# Patient Record
Sex: Male | Born: 1945
Health system: Southern US, Community
[De-identification: ages and names within clinical notes are randomized; demographics above are authoritative.]

## PROBLEM LIST (undated history)

## (undated) DIAGNOSIS — B20 Human immunodeficiency virus [HIV] disease: Secondary | ICD-10-CM

## (undated) DIAGNOSIS — E785 Hyperlipidemia, unspecified: Secondary | ICD-10-CM

## (undated) DIAGNOSIS — Z21 Asymptomatic human immunodeficiency virus [HIV] infection status: Secondary | ICD-10-CM

## (undated) DIAGNOSIS — E039 Hypothyroidism, unspecified: Secondary | ICD-10-CM

## (undated) DIAGNOSIS — I700 Atherosclerosis of aorta: Secondary | ICD-10-CM

## (undated) DIAGNOSIS — I10 Essential (primary) hypertension: Principal | ICD-10-CM

## (undated) DIAGNOSIS — E782 Mixed hyperlipidemia: Principal | ICD-10-CM

## (undated) DIAGNOSIS — E291 Testicular hypofunction: Secondary | ICD-10-CM

## (undated) DIAGNOSIS — Z23 Encounter for immunization: Principal | ICD-10-CM

## (undated) DIAGNOSIS — Z1159 Encounter for screening for other viral diseases: Secondary | ICD-10-CM

## (undated) DIAGNOSIS — M7712 Lateral epicondylitis, left elbow: Principal | ICD-10-CM

## (undated) DIAGNOSIS — C61 Malignant neoplasm of prostate: Principal | ICD-10-CM

## (undated) DIAGNOSIS — Z2911 Encounter for prophylactic immunotherapy for respiratory syncytial virus (RSV): Secondary | ICD-10-CM

## (undated) DIAGNOSIS — Z87891 Personal history of nicotine dependence: Secondary | ICD-10-CM

## (undated) HISTORY — DX: Hypothyroidism, unspecified: E03.9

## (undated) HISTORY — DX: Hyperlipidemia, unspecified: E78.5

## (undated) HISTORY — DX: Atherosclerosis of aorta: I70.0

## (undated) HISTORY — DX: Human immunodeficiency virus (HIV) disease: B20

## (undated) HISTORY — DX: Asymptomatic human immunodeficiency virus (hiv) infection status: Z21

---

## 1987-11-22 ENCOUNTER — Encounter (INDEPENDENT_AMBULATORY_CARE_PROVIDER_SITE_OTHER): Payer: Self-pay | Admitting: *Deleted

## 1987-11-22 LAB — CONVERTED CEMR LAB
CD4 Count: 240 microliters
CD4 T Cell Abs: 240

## 1998-12-23 ENCOUNTER — Encounter: Admission: RE | Admit: 1998-12-23 | Discharge: 1998-12-23 | Payer: Self-pay | Admitting: Infectious Diseases

## 1998-12-23 ENCOUNTER — Ambulatory Visit (HOSPITAL_COMMUNITY): Admission: RE | Admit: 1998-12-23 | Discharge: 1998-12-23 | Payer: Self-pay | Admitting: Infectious Diseases

## 1999-01-15 ENCOUNTER — Encounter: Admission: RE | Admit: 1999-01-15 | Discharge: 1999-01-15 | Payer: Self-pay | Admitting: Infectious Diseases

## 1999-01-15 ENCOUNTER — Ambulatory Visit (HOSPITAL_COMMUNITY): Admission: RE | Admit: 1999-01-15 | Discharge: 1999-01-15 | Payer: Self-pay | Admitting: Infectious Diseases

## 1999-02-12 ENCOUNTER — Encounter: Admission: RE | Admit: 1999-02-12 | Discharge: 1999-02-12 | Payer: Self-pay | Admitting: Infectious Diseases

## 1999-03-08 ENCOUNTER — Encounter: Admission: RE | Admit: 1999-03-08 | Discharge: 1999-03-08 | Payer: Self-pay | Admitting: Infectious Diseases

## 1999-04-26 ENCOUNTER — Encounter: Admission: RE | Admit: 1999-04-26 | Discharge: 1999-04-26 | Payer: Self-pay | Admitting: Infectious Diseases

## 1999-04-26 ENCOUNTER — Ambulatory Visit (HOSPITAL_COMMUNITY): Admission: RE | Admit: 1999-04-26 | Discharge: 1999-04-26 | Payer: Self-pay | Admitting: Infectious Diseases

## 1999-05-19 ENCOUNTER — Encounter: Admission: RE | Admit: 1999-05-19 | Discharge: 1999-05-19 | Payer: Self-pay | Admitting: Infectious Diseases

## 1999-08-17 ENCOUNTER — Ambulatory Visit (HOSPITAL_COMMUNITY): Admission: RE | Admit: 1999-08-17 | Discharge: 1999-08-17 | Payer: Self-pay | Admitting: Infectious Diseases

## 1999-09-13 ENCOUNTER — Encounter: Admission: RE | Admit: 1999-09-13 | Discharge: 1999-09-13 | Payer: Self-pay | Admitting: Infectious Diseases

## 1999-12-14 ENCOUNTER — Encounter: Admission: RE | Admit: 1999-12-14 | Discharge: 1999-12-14 | Payer: Self-pay | Admitting: Infectious Diseases

## 1999-12-14 ENCOUNTER — Ambulatory Visit (HOSPITAL_COMMUNITY): Admission: RE | Admit: 1999-12-14 | Discharge: 1999-12-14 | Payer: Self-pay | Admitting: Infectious Diseases

## 2000-01-12 ENCOUNTER — Encounter: Admission: RE | Admit: 2000-01-12 | Discharge: 2000-01-12 | Payer: Self-pay | Admitting: Infectious Diseases

## 2000-03-25 ENCOUNTER — Ambulatory Visit (HOSPITAL_COMMUNITY): Admission: RE | Admit: 2000-03-25 | Discharge: 2000-03-25 | Payer: Self-pay | Admitting: Orthopedic Surgery

## 2000-03-25 ENCOUNTER — Encounter: Payer: Self-pay | Admitting: Orthopedic Surgery

## 2000-04-27 ENCOUNTER — Ambulatory Visit (HOSPITAL_COMMUNITY): Admission: RE | Admit: 2000-04-27 | Discharge: 2000-04-27 | Payer: Self-pay | Admitting: Infectious Diseases

## 2000-04-27 ENCOUNTER — Encounter: Admission: RE | Admit: 2000-04-27 | Discharge: 2000-04-27 | Payer: Self-pay | Admitting: Infectious Diseases

## 2000-05-22 ENCOUNTER — Encounter: Admission: RE | Admit: 2000-05-22 | Discharge: 2000-05-22 | Payer: Self-pay | Admitting: Infectious Diseases

## 2000-08-30 ENCOUNTER — Ambulatory Visit (HOSPITAL_COMMUNITY): Admission: RE | Admit: 2000-08-30 | Discharge: 2000-08-30 | Payer: Self-pay | Admitting: Infectious Diseases

## 2000-10-02 ENCOUNTER — Encounter: Admission: RE | Admit: 2000-10-02 | Discharge: 2000-10-02 | Payer: Self-pay | Admitting: Infectious Diseases

## 2001-01-30 ENCOUNTER — Encounter: Admission: RE | Admit: 2001-01-30 | Discharge: 2001-01-30 | Payer: Self-pay | Admitting: Infectious Diseases

## 2001-01-30 ENCOUNTER — Ambulatory Visit (HOSPITAL_COMMUNITY): Admission: RE | Admit: 2001-01-30 | Discharge: 2001-01-30 | Payer: Self-pay | Admitting: Infectious Diseases

## 2001-03-02 ENCOUNTER — Encounter: Admission: RE | Admit: 2001-03-02 | Discharge: 2001-03-02 | Payer: Self-pay | Admitting: Infectious Diseases

## 2001-06-18 ENCOUNTER — Ambulatory Visit (HOSPITAL_COMMUNITY): Admission: RE | Admit: 2001-06-18 | Discharge: 2001-06-18 | Payer: Self-pay | Admitting: Infectious Diseases

## 2001-06-18 ENCOUNTER — Encounter: Admission: RE | Admit: 2001-06-18 | Discharge: 2001-06-18 | Payer: Self-pay | Admitting: Infectious Diseases

## 2001-06-25 ENCOUNTER — Ambulatory Visit (HOSPITAL_COMMUNITY): Admission: RE | Admit: 2001-06-25 | Discharge: 2001-06-25 | Payer: Self-pay | Admitting: Gastroenterology

## 2001-07-16 ENCOUNTER — Encounter: Admission: RE | Admit: 2001-07-16 | Discharge: 2001-07-16 | Payer: Self-pay | Admitting: Infectious Diseases

## 2001-12-04 ENCOUNTER — Ambulatory Visit (HOSPITAL_COMMUNITY): Admission: RE | Admit: 2001-12-04 | Discharge: 2001-12-04 | Payer: Self-pay | Admitting: Infectious Diseases

## 2001-12-04 ENCOUNTER — Encounter: Admission: RE | Admit: 2001-12-04 | Discharge: 2001-12-04 | Payer: Self-pay | Admitting: Infectious Diseases

## 2001-12-24 ENCOUNTER — Encounter: Admission: RE | Admit: 2001-12-24 | Discharge: 2001-12-24 | Payer: Self-pay | Admitting: Infectious Diseases

## 2002-05-06 ENCOUNTER — Encounter: Admission: RE | Admit: 2002-05-06 | Discharge: 2002-05-06 | Payer: Self-pay | Admitting: Infectious Diseases

## 2002-05-06 ENCOUNTER — Ambulatory Visit (HOSPITAL_COMMUNITY): Admission: RE | Admit: 2002-05-06 | Discharge: 2002-05-06 | Payer: Self-pay | Admitting: Infectious Diseases

## 2002-05-20 ENCOUNTER — Encounter: Admission: RE | Admit: 2002-05-20 | Discharge: 2002-05-20 | Payer: Self-pay | Admitting: Infectious Diseases

## 2002-07-01 ENCOUNTER — Encounter: Admission: RE | Admit: 2002-07-01 | Discharge: 2002-07-01 | Payer: Self-pay | Admitting: Infectious Diseases

## 2002-07-01 ENCOUNTER — Ambulatory Visit (HOSPITAL_COMMUNITY): Admission: RE | Admit: 2002-07-01 | Discharge: 2002-07-01 | Payer: Self-pay | Admitting: Infectious Diseases

## 2002-07-15 ENCOUNTER — Encounter: Admission: RE | Admit: 2002-07-15 | Discharge: 2002-07-15 | Payer: Self-pay | Admitting: Infectious Diseases

## 2002-10-02 ENCOUNTER — Encounter: Admission: RE | Admit: 2002-10-02 | Discharge: 2002-10-02 | Payer: Self-pay | Admitting: Internal Medicine

## 2002-10-02 ENCOUNTER — Ambulatory Visit (HOSPITAL_COMMUNITY): Admission: RE | Admit: 2002-10-02 | Discharge: 2002-10-02 | Payer: Self-pay | Admitting: Infectious Diseases

## 2002-10-28 ENCOUNTER — Encounter: Admission: RE | Admit: 2002-10-28 | Discharge: 2002-10-28 | Payer: Self-pay | Admitting: Infectious Diseases

## 2003-01-28 ENCOUNTER — Encounter: Admission: RE | Admit: 2003-01-28 | Discharge: 2003-01-28 | Payer: Self-pay | Admitting: Infectious Diseases

## 2003-02-17 ENCOUNTER — Encounter: Admission: RE | Admit: 2003-02-17 | Discharge: 2003-02-17 | Payer: Self-pay | Admitting: Infectious Diseases

## 2003-04-30 ENCOUNTER — Encounter: Admission: RE | Admit: 2003-04-30 | Discharge: 2003-04-30 | Payer: Self-pay | Admitting: Infectious Diseases

## 2003-07-07 ENCOUNTER — Ambulatory Visit (HOSPITAL_COMMUNITY): Admission: RE | Admit: 2003-07-07 | Discharge: 2003-07-07 | Payer: Self-pay | Admitting: Infectious Diseases

## 2003-07-07 ENCOUNTER — Encounter: Admission: RE | Admit: 2003-07-07 | Discharge: 2003-07-07 | Payer: Self-pay | Admitting: Infectious Diseases

## 2003-08-20 ENCOUNTER — Encounter: Admission: RE | Admit: 2003-08-20 | Discharge: 2003-08-20 | Payer: Self-pay | Admitting: Infectious Diseases

## 2003-10-27 ENCOUNTER — Ambulatory Visit (HOSPITAL_COMMUNITY): Admission: RE | Admit: 2003-10-27 | Discharge: 2003-10-27 | Payer: Self-pay | Admitting: Infectious Diseases

## 2003-10-27 ENCOUNTER — Encounter: Admission: RE | Admit: 2003-10-27 | Discharge: 2003-10-27 | Payer: Self-pay | Admitting: Infectious Diseases

## 2003-11-10 ENCOUNTER — Encounter: Admission: RE | Admit: 2003-11-10 | Discharge: 2003-11-10 | Payer: Self-pay | Admitting: Infectious Diseases

## 2004-04-12 ENCOUNTER — Encounter: Admission: RE | Admit: 2004-04-12 | Discharge: 2004-04-12 | Payer: Self-pay | Admitting: Infectious Diseases

## 2004-04-26 ENCOUNTER — Encounter: Admission: RE | Admit: 2004-04-26 | Discharge: 2004-04-26 | Payer: Self-pay | Admitting: Infectious Diseases

## 2004-05-01 ENCOUNTER — Emergency Department (HOSPITAL_COMMUNITY): Admission: EM | Admit: 2004-05-01 | Discharge: 2004-05-01 | Payer: Self-pay | Admitting: *Deleted

## 2004-08-06 ENCOUNTER — Ambulatory Visit (HOSPITAL_COMMUNITY): Admission: RE | Admit: 2004-08-06 | Discharge: 2004-08-06 | Payer: Self-pay | Admitting: Infectious Diseases

## 2004-08-06 ENCOUNTER — Ambulatory Visit: Payer: Self-pay | Admitting: Infectious Diseases

## 2004-08-26 ENCOUNTER — Ambulatory Visit: Payer: Self-pay | Admitting: Infectious Diseases

## 2005-01-26 ENCOUNTER — Ambulatory Visit: Payer: Self-pay | Admitting: Infectious Diseases

## 2005-01-26 ENCOUNTER — Ambulatory Visit (HOSPITAL_COMMUNITY): Admission: RE | Admit: 2005-01-26 | Discharge: 2005-01-26 | Payer: Self-pay | Admitting: Infectious Diseases

## 2005-03-23 ENCOUNTER — Ambulatory Visit: Payer: Self-pay | Admitting: Infectious Diseases

## 2005-05-25 ENCOUNTER — Ambulatory Visit: Payer: Self-pay | Admitting: Infectious Diseases

## 2005-06-13 ENCOUNTER — Ambulatory Visit: Payer: Self-pay | Admitting: Infectious Diseases

## 2005-09-12 ENCOUNTER — Encounter (INDEPENDENT_AMBULATORY_CARE_PROVIDER_SITE_OTHER): Payer: Self-pay | Admitting: *Deleted

## 2005-09-12 ENCOUNTER — Ambulatory Visit (HOSPITAL_COMMUNITY): Admission: RE | Admit: 2005-09-12 | Discharge: 2005-09-12 | Payer: Self-pay | Admitting: Infectious Diseases

## 2005-09-12 ENCOUNTER — Ambulatory Visit: Payer: Self-pay | Admitting: Infectious Diseases

## 2005-09-12 LAB — CONVERTED CEMR LAB
CD4 Count: 430 microliters
HIV 1 RNA Quant: 49 copies/mL

## 2005-10-10 ENCOUNTER — Ambulatory Visit: Payer: Self-pay | Admitting: Infectious Diseases

## 2006-04-13 ENCOUNTER — Encounter: Admission: RE | Admit: 2006-04-13 | Discharge: 2006-04-13 | Payer: Self-pay | Admitting: Infectious Diseases

## 2006-04-13 ENCOUNTER — Ambulatory Visit: Payer: Self-pay | Admitting: Infectious Diseases

## 2006-04-13 ENCOUNTER — Encounter (INDEPENDENT_AMBULATORY_CARE_PROVIDER_SITE_OTHER): Payer: Self-pay | Admitting: *Deleted

## 2006-04-13 LAB — CONVERTED CEMR LAB
CD4 Count: 430 microliters
HIV 1 RNA Quant: 49 copies/mL

## 2006-05-02 ENCOUNTER — Ambulatory Visit: Payer: Self-pay | Admitting: Infectious Diseases

## 2006-10-02 ENCOUNTER — Encounter (INDEPENDENT_AMBULATORY_CARE_PROVIDER_SITE_OTHER): Payer: Self-pay | Admitting: *Deleted

## 2006-10-02 ENCOUNTER — Ambulatory Visit: Payer: Self-pay | Admitting: Infectious Diseases

## 2006-10-02 ENCOUNTER — Encounter: Admission: RE | Admit: 2006-10-02 | Discharge: 2006-10-02 | Payer: Self-pay | Admitting: Infectious Diseases

## 2006-10-02 LAB — CONVERTED CEMR LAB
ALT: 34 units/L (ref 0–53)
AST: 24 units/L (ref 0–37)
Albumin: 4.2 g/dL (ref 3.5–5.2)
Alkaline Phosphatase: 69 units/L (ref 39–117)
BUN: 14 mg/dL (ref 6–23)
Basophils Absolute: 0.1 10*3/uL (ref 0.0–0.1)
Basophils Relative: 1 % (ref 0–1)
CD4 Count: 390 microliters
CO2: 24 meq/L (ref 19–32)
Calcium: 9 mg/dL (ref 8.4–10.5)
Chloride: 107 meq/L (ref 96–112)
Creatinine, Ser: 0.88 mg/dL (ref 0.40–1.50)
Eosinophils Relative: 7 % — ABNORMAL HIGH (ref 0–4)
Glucose, Bld: 79 mg/dL (ref 70–99)
HCT: 41.1 % (ref 41.0–49.0)
HIV 1 RNA Quant: 55 copies/mL
HIV 1 RNA Quant: 55 copies/mL — ABNORMAL HIGH (ref <50)
HIV-1 RNA Quant, Log: 1.74 — ABNORMAL HIGH (ref <1.70)
Hemoglobin: 14.4 g/dL (ref 13.9–16.8)
Lymphocytes Relative: 25 % (ref 15–43)
Lymphs Abs: 1.8 10*3/uL (ref 0.8–3.1)
MCHC: 35 g/dL (ref 33.1–35.4)
MCV: 95.6 fL (ref 78.8–100.0)
Monocytes Absolute: 0.5 10*3/uL (ref 0.2–0.7)
Monocytes Relative: 7 % (ref 3–11)
Neutro Abs: 4.5 10*3/uL (ref 1.8–6.8)
Neutrophils Relative %: 61 % (ref 47–77)
Platelets: 153 10*3/uL (ref 152–374)
Potassium: 4 meq/L (ref 3.5–5.3)
RBC: 4.3 M/uL (ref 4.20–5.50)
RDW: 13.6 % (ref 11.5–15.3)
Sodium: 141 meq/L (ref 135–145)
Total Bilirubin: 0.5 mg/dL (ref 0.3–1.2)
Total Protein: 6.3 g/dL (ref 6.0–8.3)
WBC: 7.4 10*3/uL (ref 3.7–10.0)

## 2006-10-23 ENCOUNTER — Ambulatory Visit: Payer: Self-pay | Admitting: Internal Medicine

## 2007-01-15 ENCOUNTER — Encounter (INDEPENDENT_AMBULATORY_CARE_PROVIDER_SITE_OTHER): Payer: Self-pay | Admitting: *Deleted

## 2007-01-15 LAB — CONVERTED CEMR LAB

## 2007-01-28 ENCOUNTER — Encounter (INDEPENDENT_AMBULATORY_CARE_PROVIDER_SITE_OTHER): Payer: Self-pay | Admitting: *Deleted

## 2007-01-29 ENCOUNTER — Encounter (INDEPENDENT_AMBULATORY_CARE_PROVIDER_SITE_OTHER): Payer: Self-pay | Admitting: Infectious Diseases

## 2007-02-12 ENCOUNTER — Ambulatory Visit: Payer: Self-pay | Admitting: Infectious Diseases

## 2007-02-12 LAB — CONVERTED CEMR LAB
Basophils Absolute: 0 10*3/uL (ref 0.0–0.1)
Basophils Relative: 1 % (ref 0–1)
Eosinophils Absolute: 0.2 10*3/uL (ref 0.0–0.7)
Eosinophils Relative: 4 % (ref 0–5)
HCT: 40.8 % (ref 39.0–52.0)
Hemoglobin: 14.3 g/dL (ref 13.0–17.0)
INR: 1 (ref 0.0–1.5)
Lymphocytes Relative: 21 % (ref 12–46)
Lymphs Abs: 1.1 10*3/uL (ref 0.7–3.3)
MCHC: 35.2 g/dL (ref 30.0–36.0)
MCV: 96.7 fL (ref 78.0–100.0)
Monocytes Absolute: 0.6 10*3/uL (ref 0.2–0.7)
Monocytes Relative: 10 % (ref 3–11)
Neutro Abs: 3.4 10*3/uL (ref 1.7–7.7)
Neutrophils Relative %: 64 % (ref 43–77)
Platelets: 169 10*3/uL (ref 150–400)
Prothrombin Time: 13 s (ref 11.6–15.2)
RBC: 4.22 M/uL (ref 4.22–5.81)
RDW: 13 % (ref 11.5–14.0)
WBC: 5.2 10*3/uL (ref 4.0–10.5)
aPTT: 28 s (ref 24–37)

## 2007-05-28 ENCOUNTER — Telehealth: Payer: Self-pay | Admitting: Internal Medicine

## 2007-08-27 DIAGNOSIS — B20 Human immunodeficiency virus [HIV] disease: Secondary | ICD-10-CM | POA: Insufficient documentation

## 2010-10-13 ENCOUNTER — Encounter (INDEPENDENT_AMBULATORY_CARE_PROVIDER_SITE_OTHER): Payer: Self-pay | Admitting: *Deleted

## 2010-12-23 NOTE — Miscellaneous (Signed)
  Clinical Lists Changes  Observations: Added new observation of YEARAIDSPOS: 2006  (10/13/2010 8:59) Added new observation of HIV STATUS: CDC-defined AIDS  (10/13/2010 8:59)

## 2013-04-09 ENCOUNTER — Ambulatory Visit
Admission: RE | Admit: 2013-04-09 | Discharge: 2013-04-09 | Disposition: A | Discharge status: Home / Self Care | Payer: Medicare Other | Source: Ambulatory Visit | Attending: Sports Medicine | Admitting: Sports Medicine

## 2013-04-09 ENCOUNTER — Encounter: Payer: Self-pay | Admitting: Sports Medicine

## 2013-04-09 ENCOUNTER — Other Ambulatory Visit: Payer: Self-pay | Admitting: Sports Medicine

## 2013-04-09 ENCOUNTER — Ambulatory Visit (INDEPENDENT_AMBULATORY_CARE_PROVIDER_SITE_OTHER): Payer: Medicare Other | Admitting: Sports Medicine

## 2013-04-09 VITALS — BP 116/80 | Ht 74.0 in | Wt 194.0 lb

## 2013-04-09 DIAGNOSIS — R52 Pain, unspecified: Secondary | ICD-10-CM

## 2013-04-09 DIAGNOSIS — M25569 Pain in unspecified knee: Secondary | ICD-10-CM

## 2013-04-09 DIAGNOSIS — M25512 Pain in left shoulder: Secondary | ICD-10-CM | POA: Insufficient documentation

## 2013-04-09 DIAGNOSIS — M25561 Pain in right knee: Secondary | ICD-10-CM

## 2013-04-09 DIAGNOSIS — M542 Cervicalgia: Secondary | ICD-10-CM | POA: Insufficient documentation

## 2013-04-09 DIAGNOSIS — M25562 Pain in left knee: Secondary | ICD-10-CM

## 2013-04-09 DIAGNOSIS — M25519 Pain in unspecified shoulder: Secondary | ICD-10-CM

## 2013-04-09 NOTE — Assessment & Plan Note (Signed)
Possibly secondary to some cervical degeneration of facet joints with muscle spasm. Check plain films. F/u in one month.

## 2013-04-09 NOTE — Assessment & Plan Note (Signed)
Prescribe some ROM exercises and f/u in one month for further evaluation and MSK [place].

## 2013-04-09 NOTE — Progress Notes (Signed)
  Subjective:    Patient ID: Zachary Levy, male    DOB: 09-10-1946, 67 y.o.   MRN: [id number]  HPI  1. Right knee pain. Bothersome for years intermittently. History of right lateral tibial plateua fracture in 2011. He walked with crutches for 6 weeks, but seemed to recover. His main concern is the medial swelling of his knee. Has some fluid on knee intermittently with pain on medial aspect. Worse with walking, bending. He went to the beach last week and felt worse after walking on the sand.  Denies any redness, warmth, fever, chills, weight loss, recent injury.   2. Right neck pain. Bothersome for 5 years, constant moderate achiness. No previous neck injury, falls, arm numbness/tingling.   3. Left shoulder pain. Present for 2-3 years, Worsened in past few weeks. Feels he struggles to lift over his head, has a sharp pain with reaching also. Some achiness. No night pain, swelling, numbness, rash. No previous shoulder injuries recalled.   Review of Systems See HPI otherwise negative.  has no tobacco history on file.     Objective:   Physical Exam  Vitals reviewed. Constitutional: He appears well-developed and well-nourished. No distress.  HENT:  Head: Normocephalic and atraumatic.  Neck:  Decreased right rotation by 20 degree compared to left. Decrease in left lateral flexion by 20 degrees compared to right.  Negative spurlings maneuver.  Right side muscle spasm on trapezius.   Musculoskeletal:  Right knee flexion intact, extension lacks 5 deg compared to left. Bony hypertrophy noted medially. No joint line tenderness. Neg patellar compression test.  Negative mcmurray and thessaly.   Stance show right genu varus. Q angle increased to 15 deg on right, 7 deg on left.   Shoulder exam shows left pain with abduction above 100 degrees. Equivocal neers and empty can testing. Strength intact with lift off, hawkings, ext and internal rotation.   Skin: He is not diaphoretic.    MSK [place]: Right  knee shows extruding medial meniscus. Lateral meniscus has fragmentation and cyst formation adjacent to remote spur from lateral tib plateau fracture. Small suprapatellar effusion noted.       Assessment & Plan:

## 2013-04-09 NOTE — Patient Instructions (Addendum)
Your knee has some wear and tear of cartilage (meniscus) related to your history of fracture. Start doing shoulder and neck range of motion exercises. Work on Estate agent and stationary bike. Wear compression for your knee with activities. Try lateral heel wedge to help reduce your arthritis symptoms. Make an appointment for check up of your shoulder in next 4 weeks.  Get your neck xray and knee xrays done.

## 2013-04-09 NOTE — Assessment & Plan Note (Signed)
Likely secondary to OA after remote lateral tibial plateau fracture, with some degeneration of meniscus on the [place]. Recommend quad strengthening with leg raises and stationary bike. Compression sleeve and lateral heel lift due to genu varus. F/u in one month.

## 2013-05-15 ENCOUNTER — Ambulatory Visit (INDEPENDENT_AMBULATORY_CARE_PROVIDER_SITE_OTHER): Payer: Medicare Other | Admitting: Sports Medicine

## 2013-05-15 VITALS — BP 100/70 | Ht 74.0 in | Wt 194.0 lb

## 2013-05-15 DIAGNOSIS — M25519 Pain in unspecified shoulder: Secondary | ICD-10-CM

## 2013-05-15 DIAGNOSIS — M25561 Pain in right knee: Secondary | ICD-10-CM

## 2013-05-15 DIAGNOSIS — M25569 Pain in unspecified knee: Secondary | ICD-10-CM

## 2013-05-15 DIAGNOSIS — M542 Cervicalgia: Secondary | ICD-10-CM

## 2013-05-15 DIAGNOSIS — M25512 Pain in left shoulder: Secondary | ICD-10-CM

## 2013-05-15 NOTE — Patient Instructions (Addendum)
Upper body Nothing behind neck All presses forward at some angle Isometrics neck in all directions - goal is 5 reps of 5 secs  Neck position Cheek bone over breast bone Watch leaning head forward Shoulders back particularly after swimming and neutral position  Knee issues Avoid knee bend greater than 45 degrees with body weight At least 1 time per week get on bike Do some quad strength - knee extension stop  At 60 deg of flexion Leg press don't come back too far  Use compression sleeve and leave on for 30 mins. After exercise You can wash out  See me as neeeded

## 2013-05-15 NOTE — Assessment & Plan Note (Signed)
Postural exercises  Isometric exercises  Range of motion

## 2013-05-15 NOTE — Progress Notes (Signed)
Patient ID: Zachary Levy, male   DOB: 02/09/1946, 67 y.o.   MRN: [id number]  Patient comes back for a review of 3 significant problems.  Right knee pain-this is improved with using a compression sleeve. Unfortunately he lost a compression sleeve. He is also doing the exercises. No swelling or mechanical symptoms have occurred.  Right sided neck pain. This does not radiate down into his arm. This not associated with weakness. This does affect the upper trapezius and posterior right neck.  Left shoulder pain that she thinks the exercises have really helped lessen this. He still occasionally wakens from sleep at night if he rolls over on the shoulder. He has been doing swimming and is able to swim a mile to 3 times a week without significant shoulder problems.  Examination  Patient is in no acute distress  Neck reveals that he has some limitation of motion of rotation and lateral bending but has full extension and flexion. Cervical spine films were reviewed with the patient and they showed some significant degenerative disc disease and arthritic spurring in the lower cervical vertebrae. Neurologically intact.  Right knee shows full flexion today. The knee is stable and there is no sign of swelling. Standing knee films were reviewed with the patient. There is minimal if any degenerative change noted on the plain x-rays.  Left shoulder patient has full range of motion without significant pain. There is no significant weakness noted.

## 2013-05-15 NOTE — Assessment & Plan Note (Signed)
Continue home rotator cuff strengthening 2-3 times a week  Continue swimming program  Recheck as needed

## 2013-05-15 NOTE — Assessment & Plan Note (Signed)
He was reassured that he does not have any significant arthritis but some very mild spurring and some degenerative meniscal changes on ultrasound.  Crosstraining on the bike  Knee compression sleeve  Avoid deep knee bends

## 2013-08-22 DIAGNOSIS — E039 Hypothyroidism, unspecified: Secondary | ICD-10-CM | POA: Insufficient documentation

## 2013-08-22 DIAGNOSIS — E785 Hyperlipidemia, unspecified: Secondary | ICD-10-CM | POA: Insufficient documentation

## 2014-09-03 ENCOUNTER — Other Ambulatory Visit: Payer: Self-pay | Admitting: Internal Medicine

## 2014-09-03 DIAGNOSIS — K640 First degree hemorrhoids: Secondary | ICD-10-CM

## 2014-09-03 MED ORDER — HYDROCORTISONE 2.5 % RE CREA: 1.0000 "application " | TOPICAL_CREAM | Freq: Two times a day (BID) | RECTAL | Status: DC

## 2014-12-16 ENCOUNTER — Other Ambulatory Visit: Payer: Self-pay | Admitting: Gastroenterology

## 2014-12-16 LAB — HM COLONOSCOPY

## 2014-12-22 DIAGNOSIS — R351 Nocturia: Secondary | ICD-10-CM | POA: Insufficient documentation

## 2014-12-22 DIAGNOSIS — N529 Male erectile dysfunction, unspecified: Secondary | ICD-10-CM | POA: Insufficient documentation

## 2015-10-19 ENCOUNTER — Encounter: Payer: Self-pay | Admitting: Internal Medicine

## 2015-12-25 DIAGNOSIS — B20 Human immunodeficiency virus [HIV] disease: Secondary | ICD-10-CM | POA: Diagnosis not present

## 2015-12-25 DIAGNOSIS — E785 Hyperlipidemia, unspecified: Secondary | ICD-10-CM | POA: Diagnosis not present

## 2015-12-25 DIAGNOSIS — E039 Hypothyroidism, unspecified: Secondary | ICD-10-CM | POA: Diagnosis not present

## 2016-02-19 DIAGNOSIS — H1013 Acute atopic conjunctivitis, bilateral: Secondary | ICD-10-CM | POA: Diagnosis not present

## 2016-04-07 DIAGNOSIS — Z21 Asymptomatic human immunodeficiency virus [HIV] infection status: Secondary | ICD-10-CM | POA: Diagnosis not present

## 2016-04-07 DIAGNOSIS — Z6826 Body mass index (BMI) 26.0-26.9, adult: Secondary | ICD-10-CM | POA: Diagnosis not present

## 2016-04-20 DIAGNOSIS — Z6826 Body mass index (BMI) 26.0-26.9, adult: Secondary | ICD-10-CM | POA: Diagnosis not present

## 2016-04-20 DIAGNOSIS — E291 Testicular hypofunction: Secondary | ICD-10-CM | POA: Insufficient documentation

## 2016-04-20 DIAGNOSIS — N529 Male erectile dysfunction, unspecified: Secondary | ICD-10-CM | POA: Diagnosis not present

## 2016-05-18 DIAGNOSIS — H43393 Other vitreous opacities, bilateral: Secondary | ICD-10-CM | POA: Diagnosis not present

## 2016-05-26 ENCOUNTER — Ambulatory Visit (INDEPENDENT_AMBULATORY_CARE_PROVIDER_SITE_OTHER): Payer: Medicare Other

## 2016-05-26 ENCOUNTER — Ambulatory Visit (INDEPENDENT_AMBULATORY_CARE_PROVIDER_SITE_OTHER): Payer: Medicare Other | Admitting: Physician Assistant

## 2016-05-26 VITALS — BP 116/72 | HR 57 | Temp 97.7°F | Resp 16 | Ht 74.0 in | Wt 205.0 lb

## 2016-05-26 DIAGNOSIS — S92912A Unspecified fracture of left toe(s), initial encounter for closed fracture: Secondary | ICD-10-CM

## 2016-05-26 DIAGNOSIS — M79676 Pain in unspecified toe(s): Secondary | ICD-10-CM

## 2016-05-26 DIAGNOSIS — M79675 Pain in left toe(s): Secondary | ICD-10-CM

## 2016-05-26 DIAGNOSIS — M79609 Pain in unspecified limb: Secondary | ICD-10-CM | POA: Diagnosis not present

## 2016-05-26 DIAGNOSIS — S92512A Displaced fracture of proximal phalanx of left lesser toe(s), initial encounter for closed fracture: Secondary | ICD-10-CM | POA: Diagnosis not present

## 2016-05-26 DIAGNOSIS — S92911A Unspecified fracture of right toe(s), initial encounter for closed fracture: Secondary | ICD-10-CM

## 2016-05-26 NOTE — Progress Notes (Signed)
Patient ID: Zachary Levy, male     DOB: 02/08/1946, 70 y.o.    MRN: [id number]  PCP: Foye Spurling, MD  Chief Complaint  Patient presents with  . Toe Injury    Left little toe    Subjective:    HPI  Presents for evaluation of LEFT 5th toe pain since last night.  He accidentally stubbed the toe on a box last night in his home. He did not realize that he had injured himself until today, when he was wearing hiking boots and walking, in preparation for a hike he plans to take in [place] next week. On examining the foot, he noticed bruising and swelling of the 5th toe. He is concerned that he may have broken the toe, and given the upcoming plans, decided he should have it evaluated.  Ice has helped some. The pain has not been such that he has taken anything to reduce it.    Prior to Admission medications   Medication Sig Start Date End Date Taking? Authorizing Provider  Emtricitab-Rilpivir-Tenofov AF (ODEFSEY PO) Take by mouth.   Yes Historical Provider, MD  LEVOTHYROXINE SODIUM PO Take by mouth.   Yes Historical Provider, MD  raltegravir (ISENTRESS) 400 MG tablet Take 400 mg by mouth 2 (two) times daily.   Yes Historical Provider, MD     No Known Allergies   Patient Active Problem List   Diagnosis Date Noted  . 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 ED (erectile dysfunction) of organic origin 12/22/2014  . Excessive urination at night 12/22/2014  . HLD (hyperlipidemia) 08/22/2013  . Adult hypothyroidism 08/22/2013  . Right knee pain 04/09/2013  . Neck pain on right side 04/09/2013  . Left shoulder pain 04/09/2013  . HIV DISEASE 08/27/2007     Family History  Problem Relation Age of Onset  . Cancer Sister   . Cancer Brother      Social History   Social History  . Marital Status: Married    Spouse Name: N/A  . Number of Children: N/A  . Years of Education: N/A   Occupational History  . Not on file.   Social History Main Topics  . Smoking status: Never Smoker   .  Smokeless tobacco: Not on file  . Alcohol Use: Yes     Comment: 1-2 glass wine few times weekly  . Drug Use: No  . Sexual Activity: Not on file   Other Topics Concern  . Not on file   Social History Narrative        Review of Systems As above.      Objective:  Physical Exam  Constitutional: He is oriented to person, place, and time. He appears well-developed and well-nourished. He is active and cooperative. No distress.  BP 116/72 mmHg  Pulse 57  Temp(Src) 97.7 F (36.5 C) (Oral)  Resp 16  Ht 6\' 2"  (1.88 m)  Wt 205 lb (92.987 kg)  BMI 26.31 kg/m2  SpO2 97%   Eyes: Conjunctivae are normal.  Pulmonary/Chest: Effort normal.  Musculoskeletal:       Left ankle: He exhibits no swelling, no ecchymosis, no deformity, no laceration and normal pulse. Achilles tendon normal.       Left foot: There is decreased range of motion, tenderness, bony tenderness and swelling. There is normal capillary refill, no crepitus, no deformity and no laceration.       Feet:  Neurological: He is alert and oriented to person, place, and time.  Psychiatric: He has a normal mood  and affect. His speech is normal and behavior is normal.    Dg Toe 5th Left  05/26/2016  CLINICAL DATA:  LEFT fifth toe injury last night, pain, contusion, question fracture EXAM: DG TOE 5TH LEFT COMPARISON:  None FINDINGS: Osseous demineralization. Joint spaces preserved. Minimally displaced fracture at head of proximal phalanx. No additional fracture, dislocation, or bone destruction. IMPRESSION: Minimally displaced fracture at head of proximal phalanx LEFT fifth toe. Electronically Signed   By: Lavonia Dana M.D.   On: 05/26/2016 18:58            Assessment & Plan:  1. Pain of fifth toe - DG Toe 5th Left; Future  2. Toe fracture, right, closed, initial encounter Buddy tape 5th to 4th toe. Post op shoe. Elevate. Ice. Acetaminophen PRN. Advance activity as tolerated by pain. Advised he may not be able to do the  planned hike. RTC if symptoms worsen/persist.   Fara Chute, PA-C Physician Assistant-Certified [facility]

## 2016-05-26 NOTE — Progress Notes (Signed)
Subjective:    Patient ID: Zachary Levy, male    DOB: 08-10-1946, 70 y.o.   MRN: [id number] Chief Complaint  Patient presents with  . Toe Injury    Left little toe    HPI  Patient is a 70yo male who presents today with pain and swelling in his left pinky toe.  States he stubbed it on a box last night and did not notice the pain until walking in a pair of boots today.  He then noted swelling and erythema of the left 5th digit. Tried to ice toe with some relief but deciede to have it evaluated because of upcoming hiking trip.  He is afraid it would make it worse by walking.  Has not needed tylenol or ibuprofen  Review of Systems All others negative except those listed in HPI.  Patient Active Problem List   Diagnosis Date Noted  . 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 ED (erectile dysfunction) of organic origin 12/22/2014  . Excessive urination at night 12/22/2014  . HLD (hyperlipidemia) 08/22/2013  . Adult hypothyroidism 08/22/2013  . Right knee pain 04/09/2013  . Neck pain on right side 04/09/2013  . Left shoulder pain 04/09/2013  . HIV DISEASE 08/27/2007   No current outpatient prescriptions on file prior to visit.   No current facility-administered medications on file prior to visit.   No Known Allergies     Objective: BP 116/72 mmHg  Pulse 57  Temp(Src) 97.7 F (36.5 C) (Oral)  Resp 16  Ht 6\' 2"  (1.88 m)  Wt 205 lb (92.987 kg)  BMI 26.31 kg/m2  SpO2 97%   Physical Exam  Constitutional: He is oriented to person, place, and time. He appears well-developed and well-nourished.  Cardiovascular: Normal rate, regular rhythm, normal heart sounds, intact distal pulses and normal pulses.  Exam reveals no gallop and no friction rub.   No murmur heard. Pulses:      Radial pulses are 2+ on the right side, and 2+ on the left side.       Dorsalis pedis pulses are 2+ on the right side, and 2+ on the left side.       Posterior tibial pulses are 2+ on the right side, and 2+ on the left  side.  Musculoskeletal:       Feet:  Ecchymosis at proximal aspect of 5th digit. Swelling and erythema along from distal to proximal aspect of digit. Mild Tenderness to palpation of MCP and PIP joint of 5th toe. No limit to ROM, no pain with rom    Neurological: He is alert and oriented to person, place, and time.  Psychiatric: He has a normal mood and affect. His behavior is normal. Judgment and thought content normal.       Dg Toe 5th Left  05/26/2016  CLINICAL DATA:  LEFT fifth toe injury last night, pain, contusion, question fracture EXAM: DG TOE 5TH LEFT COMPARISON:  None FINDINGS: Osseous demineralization. Joint spaces preserved. Minimally displaced fracture at head of proximal phalanx. No additional fracture, dislocation, or bone destruction. IMPRESSION: Minimally displaced fracture at head of proximal phalanx LEFT fifth toe. Electronically Signed   By: Lavonia Dana M.D.   On: 05/26/2016 18:58      Assessment & Plan:  1. Pain of fifth toe - DG Toe 5th Left; Future  2. Toe fracture, right, closed, initial encounter  Imaging reveals fracture to left 5th digit.   Demonstrated buddy taping to patient, applied post-op shoe and recommended patient wear shoe and buddy  tape while ambulating. Recommended the use of acetaminophen for pain, and avoiding ibuprofen use.  Instructed patient to rest foot when possible to facilitate healing and ice toe as needed.  Instructed to follow up if pain worsens or as needed.  Arnett Duddy P. Rebakah Cokley, PA-S

## 2016-05-26 NOTE — Patient Instructions (Addendum)
Ice, elevate, rest. You may use acetaminophen as needed for pain (NSAIDS, like ibuprofen can slow the healing process).    IF you received an x-ray today, you will receive an invoice from [facility]. Please contact [facility] at [phone] with questions or concerns regarding your invoice.   IF you received labwork today, you will receive an invoice from [employer]. Please contact [facility] at [phone] with questions or concerns regarding your invoice.   Our billing staff will not be able to assist you with questions regarding bills from these companies.  You will be contacted with the lab results as soon as they are available. The fastest way to get your results is to activate your My Chart account. Instructions are located on the last page of this paperwork. If you have not heard from [place] regarding the results in 2 weeks, please contact this office.

## 2016-05-27 ENCOUNTER — Encounter: Payer: Self-pay | Admitting: Physician Assistant

## 2016-05-30 DIAGNOSIS — H43393 Other vitreous opacities, bilateral: Secondary | ICD-10-CM | POA: Diagnosis not present

## 2016-06-20 NOTE — Discharge Summary (Signed)
 Inpatient Patient Summary               [facility]  [address] [place], [place] [address]  [phone]  Patient Discharge Instructions     Name: Oscar Turner, Oscar Turner  Current Date: 06/20/2016 09:01:27  DOB: 01-Jan-1946 [id number] FIN: < %ALIASEFIN NBR%>  Patient Address: [address] [place] [address]  Patient Phone: [phone]  Primary Care Provider:  Name: Oscar Turner  Phone: [phone]   Immunizations Provided:      Discharge Diagnosis: Diverticulosis  Discharged To:                               TO, ANTICIPATED%>  Home Treatments:                           TREATMENTS, ANTICIPATED%>  Devices/Equipment:                      EQUIPMENT REHAB%>  Post Hospital Services:                  HOSPITAL SERVICES%>  Professional Skilled Services: SKILLED SERVICES%>  Therapist, sports and Community Resources:                SERV AND COMM RES, ANTICIPATED%>  Mode of Discharge Transportation:                              TRANSPORTATION%>  Discharge Orders:         Discharge Patient 06/20/16 8:30:00 EDT, Discharge Home/Self Care         Comment:      Medications  During the course of your visit, your medication list was updated with the most current information. The details of those changes are reflected below:         Medications that have not changed  Other Medications  aspirin (Aspirin Low Dose 81 mg oral delayed release tablet) 1 Tabs Oral (given by mouth) every day.  Last Dose:____________________  multivitamin (Multiple Vitamins oral capsule) 1 Capsules Oral (given by mouth) every day.  Last Dose:____________________  omega-3 polyunsaturated fatty acids (Fish Oil)   Last Dose:____________________  testosterone  (Testosterone  Cypionate 200 mg/mL intramuscular solution) 1 Milliliter Intramuscular (in a muscle) every other week.  Last Dose:____________________         [facility] would like to thank you for allowing us  to assist you with your healthcare needs. The following  includes patient education materials and information regarding your injury/illness.     Oscar Turner has been given the following list of follow-up instructions, prescriptions, and patient education materials:  Follow-up Instructions             With: Address: When:   Oscar Turner     [phone] Within 1 month                       It is important to always keep an active list of medications available so that you can share with other providers and manage your medications appropriately. As an additional courtesy, we are also providing you with your final active medications list that you can keep with you.           aspirin (Aspirin Low Dose 81 mg  oral delayed release tablet) 1 Tabs Oral (given by mouth) every day.  multivitamin (Multiple Vitamins oral capsule) 1 Capsules Oral (given by mouth) every day.  omega-3 polyunsaturated fatty acids (Fish Oil)   testosterone  (Testosterone  Cypionate 200 mg/mL intramuscular solution) 1 Milliliter Intramuscular (in a muscle) every other week.      Take only the medications listed above. Contact your doctor prior to taking any medications not on this list.  Discharge instructions, if any, will display below     Instructions for Diet: INSTRUCTIONS FOR DIET%>  Instructions for Supplements: SUPPLEMENT INSTRUCTIONS%>  Instructions for Activity: INSTRUCTIONS FOR ACTIVITY%>  Instructions for Wound Care: INSTRUCTIONS FOR WOUND CARE%>     Medication leaflets, if any, will display below         Patient education materials, if any, will display below               Anesthesia: Monitored Anesthesia Care (MAC)  You're due to have surgery. During surgery, you'll be given medication called anesthesia. This will keep you comfortable and pain-free. Your surgeon will use monitored anesthesia care (MAC). This sheet tells you more about this type of anesthesia.  What is monitored anesthesia care?  MAC keeps you very drowsy during surgery. You may be awake, but you will likely not remember  much. And you won't feel pain. With MAC, medications are given through an IV line into a vein in your arm or hand. A local anesthetic will usually be injected into the skin and muscle around the surgical site to numb it. The anesthesia provider monitors you during the procedure. He or she checks your heart rate and rhythm, blood pressure, and blood oxygen level.  Anesthesia tools and medications that may be near you during your procedure  You will likely have:   A pulse oximeter on the end of your finger. This measures your blood oxygen level.   Electrocardiography leads (electrodes) on your chest. These record your heart rate and rhythm.   Medications given through an IV. These relax you and prevent pain. You may be awake or sleep lightly. If you have local anesthetic, it is injected directly into your skin.   A facemask to give you oxygen, if needed.  Risks and Possible Complications  MAC has some risks. These include:   Breathing problems   Nausea and vomiting   Allergic reaction to the anesthetic   Anesthesia safety   Follow all instructions you are given for how long not to eat or drink before your procedure.   Be sure your doctor knows what medications you take, especially any anti-inflammatory medication or blood thinners. This includes aspirin and any other over-the-counter medications, herbs, and supplements.   Have an adult family member or friend drive you home after the procedure.   For the first 24 hours after your surgery:   Do not drive or use heavy equipment.   Do not make important decisions or sign documents.   Avoid alcohol.   Have someone stay with you, if possible. They can watch for problems and help keep you safe.     [address] [employer] [address], [place], [place] [address]. All rights reserved. This information is not intended as a substitute for professional medical care. Always follow your healthcare professional'Turner instructions.                IS IT A  STROKE?  Act FAST and Check for these signs:     FACE  Does the face look uneven?     ARM                    Does one arm drift down?     SPEECH             Does their speech sound strange?     TIME                   Call 9-1-1 at any sign of stroke  Heart Attack Signs  Chest discomfort: Most heart attacks involve discomfort in the center of the chest and lasts more than a few minutes, or goes away and comes back. It can feel like uncomfortable pressure, squeezing, fullness or pain.  Discomfort in upper body: Symptoms can include pain or discomfort in one or both arms, back, neck, jaw or stomach.  Shortness of breath: With or without discomfort.  Other signs: Breaking out in a cold sweat, nausea, or lightheaded.  Remember, MINUTES DO MATTER. If you experience any of these heart attack warning signs, call 9-1-1 to get immediate medical attention!               ______________________________ ___________ ___________________ ___________  Patient/Family/ Caregiver Signature Date/Time          Provider Signature Date/Time

## 2016-06-20 NOTE — Discharge Summary (Signed)
 Inpatient Clinical Summary             [facility]  Post-Acute Care Transfer Instructions  PERSON INFORMATION   Name: GILDARDO, TICKNER  MRN: [id number]    FIN#: [phone]   PHYSICIANS  Admitting Physician: NOLA VOLNEY RAMAN  Attending Physician: NOLA VOLNEY RAMAN   PCP: DUNN-MD,  TED ALAN  Discharge Diagnosis:  Diverticulosis  Comment:       PATIENT EDUCATION INFORMATION  Instructions:             Anesthesia: Monitored Anesthesia Care (MAC) short (Custom)  Medication Leaflets:               Follow-up:                          With: Address: When:   KORO-MD, NABEEL S     [phone] Within 1 month                           MEDICATION LIST  Medication Reconciliation at Discharge:         Medications that have not changed  Other Medications  aspirin (Aspirin Low Dose 81 mg oral delayed release tablet) 1 Tabs Oral (given by mouth) every day.  Last Dose:____________________  multivitamin (Multiple Vitamins oral capsule) 1 Capsules Oral (given by mouth) every day.  Last Dose:____________________  omega-3 polyunsaturated fatty acids (Fish Oil)   Last Dose:____________________  testosterone  (Testosterone  Cypionate 200 mg/mL intramuscular solution) 1 Milliliter Intramuscular (in a muscle) every other week.  Last Dose:____________________         Patient's Final Home Medication List Upon Discharge:          aspirin (Aspirin Low Dose 81 mg oral delayed release tablet) 1 Tabs Oral (given by mouth) every day.  multivitamin (Multiple Vitamins oral capsule) 1 Capsules Oral (given by mouth) every day.  omega-3 polyunsaturated fatty acids (Fish Oil)   testosterone  (Testosterone  Cypionate 200 mg/mL intramuscular solution) 1 Milliliter Intramuscular (in a muscle) every other week.         Comment:       ORDERS         Order Name Order Details   Discharge Patient 06/20/16 8:30:00 EDT, Discharge Home/Self Care

## 2016-06-20 NOTE — Procedures (Signed)
Procedure Record - MPEND             Procedure Record - MPEND Summary                                                                Primary Physician:        Joanette Gula    Case Number:              [id number]    Finalized Date/Time:      06/20/16 08:40:38    Pt. Name:                 JOBIN, MONTELONGO    D.O.B./Sex:               August 18, 1946    Male    Med Rec #:                [id number]    Physician:                Joanette Gula    Financial #:              [id number]    Pt. Type:                 S    Room/Bed:                 /    Admit/Disch:              06/20/16 06:06:00 -    Institution:       MPEND - Case Times                                                                                                        Entry 1                                                                                                          Patient      In Room Time             06/20/16 08:10:00               Out Room Time                   06/20/16 08:31:00    Anesthesia     Procedure  Start Time               06/20/16 08:12:00               Stop Time                       06/20/16 08:29:00    Last Modified By:         Cherie Dark                              06/20/16 08:30:56      MPEND - Case Times Audit                                                                         06/20/16 08:30:56         Owner: [email or web address]                             Modifier: [email or web address]                                                      <+> 1         Out Room Time        <+> 1         Stop Time        MPEND - Case Attendance                                                                                                   Entry 1                         Entry 2                         Entry 3                                          Case Attendee             KORO-MD,  NABEEL S              MILLIRON-MD,  [place],  [place] L    Role Performed            Surgeon Primary  Anesthesiologist                 Endoscopy Technician    Time In                   06/20/16 08:10:00               06/20/16 08:10:00               06/20/16 08:10:00    Time Out                  06/20/16 08:31:00               06/20/16 08:31:00               06/20/16 08:31:00    Procedure                 Colonoscopy with Biopsy         Colonoscopy with Biopsy         Colonoscopy with Biopsy                              / Brush                         / Brush                         / Brush    Last Modified By:         Hardin Negus RN, Tawni Levy, RN, Tawni Levy, RN, [facility]                              06/20/16 08:40:35               06/20/16 08:40:35               06/20/16 08:40:35                                Entry 4                                                                                                          Case Attendee             Hardin Negus, RN, SHARON H    Role Performed            Circulator    Time In                   06/20/16 08:10:00    Time Out                  06/20/16 08:31:00    Procedure  Colonoscopy with Biopsy                              / Brush    Last Modified By:         Hardin Negus RNWaynard Reeds                              06/20/16 08:40:35      MPEND - Case Attendance Audit                                                                    06/20/16 08:40:35         Owner: [email or web address]                             Modifier: [email or web address]                                                          1     <+> Time Out            1     <*> Procedure                              Colonoscopy with Biopsy / Brush            2     <+> Time Out            2     <*> Procedure                              Colonoscopy with Biopsy / Brush            3     <+> Time Out            3     <*> Procedure                              Colonoscopy with Biopsy / Brush            4     <+> Time Out            4     <*> Procedure                              Colonoscopy with Biopsy / Brush      06/20/16 08:30:30         Owner: [email or web address]                             Modifier: [email or web address]                                                      <+>  1         Procedure        <+> 2         Procedure        <+> 3         Procedure        <+> 4         Procedure     06/20/16 08:25:04         Owner: [email or web address]                             Modifier: [email or web address]                                                          1     <-> Procedure                              Colonoscopy            2     <-> Procedure                              Colonoscopy            3     <-> Procedure                              Colonoscopy            4     <-> Procedure                              Colonoscopy     06/20/16 08:18:40         Owner: [email or web address]                             Modifier: [email or web address]                                                          1     <+> Time In            1     <*> Procedure                              Colonoscopy        <+> 2         Case Attendee        <+> 2         Role Performed        <+> 2         Time In        <+> 2         Procedure        <+> 3         Case  Attendee        <+> 3         Role Performed        <+> 3         Time In        <+> 3         Procedure        <+> 4         Case Attendee        <+> 4         Role Performed        <+> 4         Time In        <+> 4         Procedure        MPEND - Patient Positioning                                                                     Pre-Care Text:            A.240 Assesses baseline skin condition  A.280 Identifies baseline musculoskeletal status  A.280.1 Identifies           physical alterations that require additional precautions for procedure-specific positioning  A.510.8 Maintains           patient's dignity and privacy  Im.120 Implements protective measures to prevent skin/tissue injury due to           mechanical sources  Im.40 Positions the patient  Im.80 Applies safety devices                              Entry 1                                                                                                           Procedure                 Colonoscopy with Biopsy         Body Position                   Lateral Right Up                              / Brush    Left Arm Position         Resting at Side                 Right Arm Position              Resting at Side    Left Leg Position         Extended  Right Leg Position              Extended    Feet Uncrossed            Yes                             Pressure Points                 Yes                                                              Checked     Positioned By             KORO-MD,  NABEEL S,             Outcome Met (O.80)              Yes                              PHILLIPS, RN, SHARON H    Last Modified By:         Hardin Negus, RN, SHARON H                              06/20/16 08:30:31    Post-Care Text:            E.10 Evaluates for signs and symptoms of physical injury to skin and tissue E.290 Evaluates musculoskeletal           status O.80 Patient is free from signs and symptoms of injury related to positioning O.120 the patient is free           from signs and symptoms of injury related to transfer/transport  O.250 Patient's musculoskeletal status is           maintained at or improved from baseline levels      MPEND - Patient Positioning Audit                                                                06/20/16 08:30:31         Owner: [email or web address]                             Modifier: [email or web address]                                                      <+> 1         Procedure     06/20/16 08:25:05         Owner: [email or web address]  Modifier: [email or web address]                                                          1     <-> Procedure                              Colonoscopy        MPEND - Skin Assessment                                                                         Pre-Care Text:            A.240 Assesses baseline skin condition Im.120 Implements  protective measures to prevent skin or tissue injury           due to mechanical sources  Im.280.1 Implements progective measures to prevent skin or tissue injury due to           thermal sources Im.45 Monitors for signs and symptons of infection                              Entry 1                                                                                                          Skin Integrity            Intact    Last Modified By:         Hardin Negus, RN, SHARON H                              06/20/16 08:19:08    Post-Care Text:            E.10 Evaluates for signs and symptoms of physical injury to skin and tissue E.270 Evaluate tissue perfusion           O.60 Patient is free from signs and symptoms of injury caused by extraneous objects   O.210 Patinet's tissue           perfusion is consistent with or improved from baseline levels      MPEND - Procedure Time Out  Pre-Care Text:            A.10 Confirms patient identity A.20 Verifies operative procedure, surgical site, and laterality A.20.1 Verifies           consent for planned procedure A.30 Verifies allergies                              Entry 1                                                                                                          Surgical/Procedure        Yes                             Time Out Complete               06/20/16 08:12:00    Team confirms     correct patient,     correct site and     correct procedure     Last Modified By:         Hardin Negus [place], [place]                              06/20/16 08:19:19    Post-Care Text:            E.30 Evaluates verification process for correct patient, site, side, and level surgery      MPEND - General Case Data                                                                                                 Entry 1                                                                                                          Case  Information      ASA Class                2  Case Level                      Level 1     OR                       MP Endo 01                      Specialty                       Gastroenterology (SN)     Wound Class              No Incision    Preop Diagnosis           SCREENING    Last Modified By:         Hardin Negus RN, SHARON H                              06/20/16 08:19:36      MPEND - Procedures                                                                                                        Entry 1                                                                                                          Procedure     Description      Procedure                Colonoscopy with Biopsy         Surgical Procedure              COLON / ANES                              / Brush                         Text     Primary Procedure         Yes                             Primary Surgeon                 KORO-MD,  NABEEL S    Start  06/20/16 08:12:00               Stop                            06/20/16 08:29:00    Anesthesia Type           Monitored Anesthesia            Surgical Service                Gastroenterology (SN)                              Care    Wound Class               No Incision    Last Modified By:         Hardin Negus, RN, SHARON H                              06/20/16 08:30:27      MPEND - Carbon Dioxide Insufflation                                                                                       Entry 1                                                                                                          Procedure                 Colonoscopy with Biopsy         Carbon Dioxide                  Wall CO2                              / Brush                         Insufflator     Insufflation Mode         Managed Flow                    Flow Rate                       2 L/min    Last Modified By:         Hardin Negus, RN, SHARON H  06/20/16 08:30:31      MPEND - Carbon Dioxide Insufflation Audit                                                        06/20/16 08:30:31         Owner: [email or web address]                             Modifier: [email or web address]                                                      <+> 1         Procedure     06/20/16 08:25:05         Owner: [email or web address]                             Modifier: [email or web address]                                                          1     <-> Procedure                              Colonoscopy        MPEND - Brushing/Biopsy/Polypectomy                                                                                       Entry 1                                                                                                          Endoscopic                Colon    Anatomical Location     Biopsy Item Details      Forcep(s) Type           Cold    Brushing Item     Details     Last Modified By:         Hardin Negus, RN, SHARON H  06/20/16 08:23:52      MPEND - Specimens                                                                               Pre-Care Text:            A.20 Verifies operative procedure, surgical site, and laterality Im.320 Manages culture specimen collection           Im.5 Manages specimen handling and disposition                              Entry 1                                                                                                          Description               A- Random colon biopsies        Specimen Type                   Routine    Last Modified By:         Hardin Negus, RN, SHARON H                              06/20/16 08:24:44    Post-Care Text:            E.40 Evaluates correct processes have been performed for specimen handling and disposition O.40 Patient's           specimen(s) is managed in the appropriate manner      MPEND - Specimens Audit                                                                          06/20/16 08:24:44         Owner:  [id number]                             Modifier: [email or web address]                                                      <+> 1         Specimen Type  MPEND - Lower Endoscopy Detail                                                                                            Entry 1                                                                                                          Colonoscopy     Completion Details      Cecum Reached            Yes                             Cecum Reached                   06/20/16 08:18:00     Withdrawal Time          06/20/16 08:29:00    Hemorrhoid     Treatment Details     Last Modified By:         Hardin Negus RN, SHARON Lemmie Evens                              06/20/16 08:39:37      MPEND - Lower Endoscopy Detail Audit                                                             06/20/16 08:39:37         Owner: [email or web address]                             Modifier: [email or web address]                                                      <+> 1         Withdrawal Time        MPEND - Transfer  Entry 1                                                                                                          Transferred By            MILLIRON-MD,  MATTHEW           Via                             Stretcher                              J, PHILLIPS, RN, SHARON                              H    Post-op Destination       ACU    Skin Assessment      Condition                Intact    Last Modified By:         Hardin Negus RN, Waynard Reeds                              06/20/16 08:36:54      Case Comments                                                                                         <None>              Finalized By: Hardin Negus RN, SHARON H      Document Signatures                                                                             Signed By:           Hardin Negus RN, SHARON H 06/20/16 08:40

## 2016-06-20 NOTE — H&P (Signed)
[facility] OP H&P Short form koro        Patient:   Oscar Turner, Oscar Turner            MRN: [id number]            FIN: [id number]               Age:   70 years     Sex:  Male     DOB:  [birth year]   Associated Diagnoses:   None   Author:   KORO-MD,  NABEEL S      Preoperative Information   Indication for surgery:  Gastrointestinal disorder.    Source of history:  Self.    screening colonoscopy       Health Status   Allergies:    Allergic Reactions (Selected)  No Known Allergies,    Allergies (1) Active Reaction  No Known Allergies None Documented     Current medications:  (Selected)   Inpatient Medications  Ordered  Lactated Ringers Injection 1,000 mL: 30 mL/hr, IV  Documented Medications  Documented  Aspirin Low Dose 81 mg oral delayed release tablet: 81 mg, 1 tabs, Oral, Daily, 30 tabs, 0 Refill(s)  Fish Oil: 0 Refill(s)  Multiple Vitamins oral capsule: 1 caps, Oral, Daily, 30 caps, 0 Refill(s)  Testosterone Cypionate 200 mg/mL intramuscular solution: 200 mg, 1 mL, IM, q2wk, 0 Refill(s),    Home Medications (4) Active  Aspirin Low Dose 81 mg oral delayed release tablet 81 mg = 1 tabs, Oral, Daily  Fish Oil   Multiple Vitamins oral capsule 1 caps, Oral, Daily  Testosterone Cypionate 200 mg/mL intramuscular solution 200 mg = 1 mL, IM, q2wk  ,    Medications (1) Active  Scheduled: (0)  Continuous: (1)  Lactated Ringers Injection solution 1,000 mL  1,000 mL, IV, 30 mL/hr  PRN: (0)     Problem list:    Patient Stated  History of prostate cancer / [id number] / Confirmed,    Active Problems (1)  History of prostate cancer         Histories   Past Medical History:    No active or resolved past medical history items have been selected or recorded.   Family History:    No family history items have been selected or recorded.   Procedure history:    L4 wedge compression fracture, sequela [id number]) in the month of 08/2014 at 89 Years.  Radical prostatectomy [id number]) in the month of 02/2011  at 89 Years.  Colonoscopy ([id number]) in 2007 at 70 Years.  Tonsillectomy [phone]).   Social History        Social & Psychosocial Habits    Alcohol  04/07/2016  Use: Current    Type: Wine    Frequency: Daily    Number of Drinks Per Day 1    Substance Abuse  04/07/2016  Use: Denies    Tobacco  06/20/2016  Use: Current some day smoker    Type: Pipe  .        Physical Examination   Vital Signs   06/20/2016 7:00 EDT Temperature Oral 36.5 degC    Heart Rate Monitored 54 bpm  LOW    Respiratory Rate 16 br/min    Systolic Blood Pressure [id number] mmHg    Diastolic Blood Pressure 85 mmHg    SpO2 96 %         Vital Signs (last 24 hrs)_____  Last Charted___________  Temp Oral     36.5 degC  (JUL  31 07:00)  Resp Rate         16 br/min  (JUL 31 07:00)  SBP      135 mmHg  (JUL 31 07:00)  DBP      85 mmHg  (JUL 31 07:00)  SpO2      96 %  (JUL 31 07:00)     Measurements from flowsheet : Measurements   06/20/2016 6:44 EDT Height/Length Estimated 173 cm    Weight Estimated 77.7 kg      General:  Alert and oriented, No acute distress.    Eye:  Pupils are equal, round and reactive to light, Extraocular movements are intact, Deferred.Marland Kitchen    HENT:  Normocephalic, Tympanic membranes are clear, Deferred..    Neck:  Non-tender, Deferred.Marland Kitchen    Respiratory:  Lungs are clear to auscultation, Respirations are non-labored.    Cardiovascular:  Normal rate, Regular rhythm, No murmur.    Gastrointestinal:  Soft, Non-tender, Deferred..    Genitourinary:  Deferred..    Lymphatics:  No lymphadenopathy neck, axilla, groin, Deferred..    Musculoskeletal     Normal range of motion.     Normal strength.     Deferred..     Integumentary:  Warm, Deferred.Marland Kitchen    Psychiatric:  Cooperative.       Health Maintenance   Immunization schedule      Health Maintenance     Pending (in the next year)     There are no current recommendations pending     Satisfied (in the past 1 year)     There are no satisfied recommendations within the defined date  range          Review / Management   Results review:     No qualifying data available.       Impression and Plan   Condition:  Stable.    Counseled:  Patient.    Proceed with scheduled procedure.   Signature Line     Electronically Signed on 06/20/2016 07:58 AM EDT   ________________________________________________   Joanette Gula

## 2016-08-26 DIAGNOSIS — Z23 Encounter for immunization: Secondary | ICD-10-CM | POA: Diagnosis not present

## 2016-09-08 DIAGNOSIS — B20 Human immunodeficiency virus [HIV] disease: Secondary | ICD-10-CM | POA: Diagnosis not present

## 2016-09-08 DIAGNOSIS — E039 Hypothyroidism, unspecified: Secondary | ICD-10-CM | POA: Diagnosis not present

## 2016-10-06 DIAGNOSIS — B20 Human immunodeficiency virus [HIV] disease: Secondary | ICD-10-CM | POA: Diagnosis not present

## 2016-10-06 DIAGNOSIS — E784 Other hyperlipidemia: Secondary | ICD-10-CM | POA: Diagnosis not present

## 2016-10-06 DIAGNOSIS — E039 Hypothyroidism, unspecified: Secondary | ICD-10-CM | POA: Diagnosis not present

## 2016-10-06 DIAGNOSIS — Z6825 Body mass index (BMI) 25.0-25.9, adult: Secondary | ICD-10-CM | POA: Diagnosis not present

## 2016-10-06 DIAGNOSIS — Z13220 Encounter for screening for lipoid disorders: Secondary | ICD-10-CM | POA: Diagnosis not present

## 2017-03-21 DIAGNOSIS — B20 Human immunodeficiency virus [HIV] disease: Secondary | ICD-10-CM | POA: Diagnosis not present

## 2017-03-21 DIAGNOSIS — E785 Hyperlipidemia, unspecified: Secondary | ICD-10-CM | POA: Diagnosis not present

## 2017-03-21 DIAGNOSIS — Z1159 Encounter for screening for other viral diseases: Secondary | ICD-10-CM | POA: Diagnosis not present

## 2017-03-21 DIAGNOSIS — E039 Hypothyroidism, unspecified: Secondary | ICD-10-CM | POA: Diagnosis not present

## 2017-05-11 NOTE — Nursing Note (Signed)
Nursing Discharge Summary - Text       Physician Discharge Summary Entered On:  05/11/2017 11:32 EDT    Performed On:  05/11/2017 11:28 EDT by Branden Shallenberger-MD, III, [place]   Provider Instructions for Diet :   A Healthy Diet   Provider Instructions for Activity :   Other: The patient is to remain strictly nonweightbearing on the left upper extremity in a sling. You should remove his arm from the sling to perform pendulum exercises 3 times a day for 5 minutes each. Work on elbow wrist and finger range of motion.   Provider Instructions for Wound Care :   Other: Keep your dressing clean and dry for total of 4 days. You may then remove your dressing and shower. Apply Band-Aids afterwards. Do not submerge incisions.   Girard Cooter - 05/11/2017 11:28 EDT

## 2017-05-11 NOTE — Procedures (Signed)
 IntraOp Record - [facility]             IntraOp Record - [facility] Summary                                                                   Primary Physician:        JULIAN DOUGLAS ELSIE FAIRY    Case Number:              [id number]    Finalized Date/Time:      05/11/17 13:04:15    Pt. Name:                 Oscar Turner, Oscar Turner    D.O.B./Sex:               03/02/1946    Male    Med Rec #:                [id number]    Physician:                JULIAN DOUGLAS ELSIE FAIRY    Financial #:              [id number]    Pt. Type:                 S    Room/Bed:                 /    Admit/Disch:              05/11/17 06:24:00 -    Institution:       [facility] - Case Times                                                                                                         Entry 1                                                                                                          Patient      In Room Time  05/11/17 08:41:00               Out Room Time                   05/11/17 11:17:00    Anesthesia     Procedure      Start Time               05/11/17 09:11:00               Stop Time                       05/11/17 11:07:00    Last Modified By:         GATLIN, RN, OLIVIA N                              05/11/17 11:21:54      [facility] - Case Times Audit                                                                          05/11/17 11:21:54         Owner: DEBBRAH                               Modifier: GATLOL                                                        <+> 1         Out Room Time     05/11/17 11:07:15         Owner: DEBBRAH                               Modifier: GATLOL                                                        <+> 1         Stop Time     05/11/17 09:13:09         Owner: DEBBRAH                               Modifier: GATLOL                                                        <+> 1         Start Time        [facility] - Case Attendance  Entry 1                         Entry 2                         Entry 3                                          Case Attendee             GOODE-CRNA,  HILLIARY M         CARROLL-MD, III,                GATLIN, RN, SCHUYLER LOISE ELSIE FAIRY    Role Performed            CRNA                            Surgeon Primary                 Circulator    Time In                   05/11/17 08:41:00               05/11/17 08:41:00               05/11/17 08:41:00    Time Out     Procedure                 Shoulder Arthroscopy            Shoulder Arthroscopy            Shoulder Arthroscopy                              with Repair(Left)               with Repair(Left)               with Repair(Left)    Last Modified By:         GATLIN, RN, OLIVIA N            GATLIN, RN, OLIVIA N            GATLIN, RN, OLIVIA N                              05/11/17 09:13:37               05/11/17 09:13:37               05/11/17 09:13:37                                Entry 4  Entry 5                         Entry 6                                          Case Attendee             Josefine  Franne LITTIE JERALD SELINDA JAYSON REGINALD,  BRIAN P    Role Performed            Surgical Scrub                  First Assistant                 Anesthesiologist    Time In                   05/11/17 08:41:00               05/11/17 08:41:00               05/11/17 08:41:00    Time Out     Procedure                 Shoulder Arthroscopy            Shoulder Arthroscopy            Shoulder Arthroscopy                              with Repair(Left)               with Repair(Left)               with Repair(Left)    Last Modified By:         GATLIN, RN, SCHUYLER LOISE LENIS, RN, SCHUYLER LOISE LENIS, RN, OLIVIA N                              05/11/17 09:13:37                05/11/17 09:13:37               05/11/17 09:13:37    General Comments:            Waddell Gosling, Biomet      [facility] - Case Attendance Audit                                                                     05/11/17 09:13:37         Owner: DEBBRAH  Modifier: GATLOL                                                            1     <*> Time In            1     <*> Procedure            1     <*> Procedure                              Shoulder Arthroscopy with Repair(Left)            1     <*> Procedure                              Shoulder Arthroscopy with Repair(Left)            2     <*> Time In            2     <*> Procedure            2     <*> Procedure                              Shoulder Arthroscopy with Repair(Left)            2     <*> Procedure                              Shoulder Arthroscopy with Repair(Left)            3     <*> Time In            3     <*> Procedure            3     <*> Procedure                              Shoulder Arthroscopy with Repair(Left)            3     <*> Procedure                              Shoulder Arthroscopy with Repair(Left)            4     <*> Time In            4     <*> Procedure            4     <*> Procedure                              Shoulder Arthroscopy with Repair(Left)            4     <*> Procedure  Shoulder Arthroscopy with Repair(Left)            5     <*> Time In            5     <*> Procedure            5     <*> Procedure                              Shoulder Arthroscopy with Repair(Left)            5     <*> Procedure                              Shoulder Arthroscopy with Repair(Left)            6     <*> Time In            6     <*> Procedure            6     <*> Procedure                              Shoulder Arthroscopy with Repair(Left)            6     <*> Procedure                              Shoulder Arthroscopy with Repair(Left)     05/11/17 07:41:51         Owner: GATLOL                                Modifier: GATLOL                                                            3     <*> Case Attendee                          VERA, RN, OLIVIA N            3     <*> Role Performed            3     <+> Procedure            4     <*> Case Attendee                          Josefine Heman L            4     <*> Role Performed            4     <+> Procedure            5     <*> Case Attendee                          BONAVITO,  JASON C  5     <*> Role Performed            5     <+> Procedure            6     <*> Case Attendee                          FERLA-MD,  BRIAN P            6     <*> Role Performed            6     <+> Procedure        [facility] - Skin Assessment                                                                          Pre-Care Text:            A.240 Assesses baseline skin condition Im.120 Implements protective measures to prevent skin or tissue injury           due to mechanical sources  Im.280.1 Implements progective measures to prevent skin or tissue injury due to           thermal sources Im.360 Monitors for signs and symptons of infection                              Entry 1                                                                                                          Skin Integrity            Intact    Last Modified By:         GATLIN, RN, SCHUYLER SAILOR                              05/11/17 09:16:05    Post-Care Text:            E.10 Evaluates for signs and symptoms of physical injury to skin and tissue E.270 Evaluate tissue perfusion           O.60 Patient is free from signs and symptoms of injury caused by extraneous objects   O.210 Patinet's tissue           perfusion is consistent with or improved from baseline levels      [facility] - Patient Positioning  Pre-Care Text:            A.240 Assesses baseline skin condition A.280 Identifies baseline musculoskeletal status A.280.1 Identifies           physical  alterations that require additional precautions for procedure-specific positioning A.510.8 Maintains           patient's dignity and privacy Im.120 Implements protective measures to prevent skin/tissue injury due to           mechanical sources Im.40 Positions the patient Im.80 Applies safety devices                              Entry 1                                                                                                          Procedure                 Shoulder Arthroscopy            Body Position                   Lateral Left Up                              with Repair(Left)    Left Arm Position         Secured in Limb                 Right Arm Position              Extended on Padded Arm                              Positioner                                                      Board w/Security Strap    Left Leg Position         Flexed                          Right Leg Position              Flexed    Feet Uncrossed            Yes                             Pressure Points                 Yes  Checked     Positioning Device        Axillary Roll, Bean             Positioned By                   GOODE-CRNA,  HILLIARY                              Bag, Foam Padding, Head                                         M, CARROLL-MD, III,                              Rest Foam, Pillow,                                              ELSIE PAC, GATLIN,                              Safety Strap                                                    RN, OLIVIA N, BONAVITO,                                                                                               JASON C, FERLA-MD,                                                                                              BRIAN P    Outcome Met (O.80)        Yes    Last Modified By:         GATLIN, RN, OLIVIA N                              05/11/17 09:16:01    Post-Care Text:            E.10 Evaluates  for signs and symptoms of physical injury to skin and tissue E.290 Evaluates musculoskeletal           status O.80 Patient is free from signs and  symptoms of injury related to positioning O.120 the patient is free           from signs and symptoms of injury related to transfer/transport  O.250 Patient's musculoskeletal status is           maintained at or improved from baseline levels      [facility] - Skin Prep                                                                                Pre-Care Text:            A.30 Verifies allergies A.20 Verifies procedure, surgical site, and laterality A.510.8 Maintains paritnet's           dignity and privacy Im.270 Performs Skin Preparation Im.270.1 Implements protective measures to prevent skin           and tissue injury due to chemical sources  A.300.1 Protects from cross-contamination                              Entry 1                                                                                                          Hair Removal     Skin Prep      Prep Agents (Im.270)     Chlorhexidine Gluconate         Prep Area (Im.270)              Arm, Shoulder                              2% w/Alcohol     Prep Area Details        Left                            Prep By                         JULIAN DOUGLAS ELSIE FAIRY    Outcome Met (O.100)  Yes    Last Modified By:         GATLIN, RN, SCHUYLER SAILOR                              05/11/17 09:16:34    Post-Care Text:            E.10 Evaluates for signs and symptoms of physical injury to skin and tissue O.100 Patient is free from signs           and symptoms of chemical injury  O.740 The patient's right to privacy is maintained      [facility] - Counts Initial and Final                                                                 Pre-Care Text:            A.20 Verifies operative procedure, sugical site, and laterality A.20.2 Assesses the  risk for unintended           retained foreign body Im.20 Performs required counts                              Entry 1                                                                                                          Initial Counts      Initial Counts           Josefine,  Jerri L, GATLIN,     Performed By             RN, SCHUYLER SAILOR    Final Counts      Final Counts             GATLIN, RN, SCHUYLER SAILOR,           Final Count Status              Correct     Performed By             Josefine Heman L     Items Included in        Sponges, Sharps     Final Count     Outcome Met (O.20)        Yes    Last Modified By:         VERA OBIE SCHUYLER SAILOR                              05/11/17 11:04:23    Post-Care Text:            E.50 Evaluates results of the surgical count O.20 Patient is free  from unintended retained foreign objects      [facility] - Counts Initial and Final Audit                                                            05/11/17 11:04:23         Owner: GATLOL                               Modifier: GATLOL                                                        <+> 1         Final Count Status        <+> 1         Items Included in Final Count        <+> 1         Outcome Met (O.20)     05/11/17 09:14:01         Owner: DEBBRAH                               Modifier: GATLOL                                                        <+> 1         Final Counts Performed By        [facility] - Counts Additional                                                                        Pre-Care Text:            A.20 Verifies operative procedure, sugical site, and laterality A.20.2 Assesses the risk for unintended           retained foreign body Im.20 Performs required counts                              Entry 1  Additional Count          Closing Count                   Additional Count                GATLIN, RN, OLIVIA N,    Type                                                       Participants                    Josefine Heman L    Count Status              Correct                         Items Counted                   Sponges, Sharps    Outcome Met (O.20)        Yes    Last Modified By:         GATLIN, RN, OLIVIA N                              05/11/17 11:04:17    Post-Care Text:            E.50 Evaluates results of the surgical count O.20 Patient is free from unintended retained foreign objects      [facility] - Counts Additional Audit                                                                   05/11/17 11:04:17         Owner: GATLOL                               Modifier: GATLOL                                                        <+> 1         Count Status        <+> 1         Outcome Met (O.20)        [facility] - General Case Data                                                                        Pre-Care Text:            A.350.1 Classifies surgical  wound                              Entry 1                                                                                                          Case Information      ASA Class                2                               Case Level                      Level 3     OR                       MP 02                           Specialty                       Orthopedic (SN)     Wound Class              1-Clean    Preop Diagnosis           RTC TEAR                        Postop Diagnosis                RTC TEAR    Last Modified By:         GATLIN, RN, OLIVIA N                              05/11/17 09:13:19    Post-Care Text:            O.760 Patient receives consistent and comparable care regardless of the setting      [facility] - General Case Data Audit                                                                   05/11/17 09:13:19         Owner: DEBBRAH                               Modifier: GATLOL                                                        <+>  1         ASA Class        <+> 1          Postop Diagnosis        [facility] - Fire Risk Assessment                                                                                               Entry 1                                                                                                          Fire Risk                 Alcohol Based Prep              Fire Risk Score                 2    Assessment: If            Solution, Surgical Site    checked, checkmark        Above Xiphoid    = 1 point     Last Modified By:         GATLIN, RN, SCHUYLER SAILOR                              05/11/17 07:42:21      [facility] - Time Out                                                                                 Pre-Care Text:            A.10 Confirms patient identity A.20 Verifies operative procedure, surgical site, and laterality A.20.1 Verifies           consent for planned procedure A.30 Verifies allergies                              Entry 1  Procedure                 Shoulder Arthroscopy            Is everyone ready               Yes                              with Repair(Left)               to perform time out     Have all members of       Yes                             Patient name and                Yes    the surgical team                                         DOB confirmed     been introduced     Allergies discussed       Yes                             Surgical procedure              Yes                                                              to be performed                                                               confirmed and                                                               verified by                                                               completed surgical                                                               consent     Correct surgical  Yes                             Correct laterality              Yes     site marked and                                           confirmed     initials are     visible through     prepped and draped     field (or     alternative ID band     used)     Correct patient           Yes                             Surgeon shares                  Yes    position confirmed                                        operative plan,                                                               possible                                                               difficulties,                                                               expected duration,                                                               anticipated blood                                                               loss and reviews  all                                                               critical/specific                                                               concerns     Required blood            Yes                             Essential imaging               Yes    products, implants,                                       available and fetal     devices and/or                                            heartones confirmed     special equipment                                         (if applicable)     available and     sterility confirmed     VTE prophylaxis           Yes                             Antibiotics ordered             Yes    addressed                                                 and administered     Anesthesia shares         Yes                             Fire risk                       Yes    anesthetic plan and                                       assessment scored     reviews patient  and plan discussed     specific concerns     Appropriate drying        Yes                             Surgeon states:                 Yes    time for prep                                              Does anyone have     observed before                                           any concerns? If     draping                                                   you see, suspect,                                                               or feel that                                                               patient care is                                                               being compromised,                                                               speak up for                                                               patient safety     Time Out Complete         05/11/17 09:10:00    Last Modified By:         GATLIN, RN, SCHUYLER SAILOR  05/11/17 09:12:59    Post-Care Text:            E.30 Evaluates verification process for correct patient, site, side, and level surgery      [facility] - Debrief                                                                                  Pre-Care Text:            Im.330 Manages specimen handling and disposition                              Entry 1                                                                                                          Procedure                 Shoulder Arthroscopy            Final counts                    Yes                              with Repair(Left)               correct and                                                               verbally verified                                                               with                                                               surgeon/licensed  independent                                                               practitioner (if                                                               applicable)     Actual procedure          Yes                             Postop diagnosis                Yes    performed confirmed                                       confirmed     Wound                      Yes                             Confirm specimens               Yes    classification                                            and specimens     confirmed                                                 labeled                                                               appropriately (if                                                               applicable)     Foley catheter            Yes                             Patient recovery                Yes  removed (if                                               plan confirmed     applicable)     Debrief Complete          05/11/17 11:04:00    Last Modified By:         GATLIN, RN, OLIVIA N                              05/11/17 11:04:10    Post-Care Text:            E.800 Ensures continuity of care E.50 Evaluates results of the surgical count O.30 Patient's procedure is           performed on the correct site, side, and level O.50 patient's current status is communicated throughout the           continuum of care O.40 Patient's specimen(s) is managed in the appropriate manner      [facility] - Debrief Audit                                                                             05/11/17 11:04:10         Owner: GATLOL                               Modifier: GATLOL                                                            1     <*> Procedure                              Shoulder Arthroscopy with Repair(Left)            1     <+> Debrief Complete        [facility] - Cautery                                                                                  Pre-Care Text:            A.240 Assesses baseline skin condition A280.1 Identifies baseline musculoskeletal status Im.50 Implements           protective measures to prevent injury due to electrical sources  Im.60 Uses supplies and equipment within safe  parameters Im.80 Applies safety devices                              Entry 1                                                                                                           ESU Type                  GENERATOR [employer]ATEC VULCAN         Grounding Pad                   Yes                                                              Needed?     Grounding Pad Site        Thigh, left                     Grounding Pastor LENIS, RN, [employer]     [employer]               [id number] x exp 04/04/2019        Outcome Met (O.10)              Yes    Last Modified By:         GATLIN, RN, SCHUYLER SAILOR                              05/11/17 11:07:25    Post-Care Text:            E.10 Evaluates for signs and symptoms of physical injury to skin and tissue O.10 Patient is free from signs and           symptoms of injury related to thermal sources  O.70 Patient is free from signs and symptoms of electrical injury      [facility] - Cautery Audit  05/11/17 11:07:25         Owner: DEBBRAH                               Modifier: GATLOL                                                        <+> 1         ESU Type        [facility] - Patient Care Devices                                                                     Pre-Care Text:            A.200 Assesses risk for normothermia regulation A.40 Verifies presence of prosthetics or corrective devices           Im.280 Implements thermoregulation measures Im.60 Uses supplies and equipment within safe parameters                              Entry 1                         Entry 2                                                                          Equipment Type            MACHINE [employer]QUENTIAL              BAIR HUGGER                              COMPRESSION    SCD Sleeve Site           Legs Bilateral    Equipment/Tag Number      [id number]                          [phone]    Initiated Pre             Yes    Induction     Last Modified By:         GATLIN, RN, SCHUYLER LOISE LENIS, RN, OLIVIA  N                              05/11/17 09:14:48               05/11/17 09:14:48    Post-Care Text:  E.10 Evaluates signs and symptoms of physical injury to skin and tissue O.60 Patient is free from signs and           symptoms of injury caused by extraneous objects      [facility] - Surgical Fluid Management                                                                Pre-Care Text:            A.280 Verifies allergies A.310 Identifies factors associated with an increased risk for hemorrhage or fluid and           electrolyte imbalance Im.210 Administers prescribed solutions A.280.1 Implements protective measures to prevent           skin or tissue injury due to thermal sources                              Entry 1                                                                                                          Irrigant                  Lactated ringers                Irrigant Volume In              3000 mL    Additive                  3ml epi to each bag of          Outcome Met (O.300)             Yes                              LR    Last Modified By:         GATLIN, RN, SCHUYLER SAILOR                              05/11/17 09:24:24    Post-Care Text:            E.10 Evaluates for signs and symptoms of physical injury to skin and tissue O.10 Patient is free from signs and           symptoms of injury due to thermal sources O.100 Patient is free from signs and symptoms of chemical injury      [facility] - Medications  Pre-Care Text:            A.10 Confirms patient identity A.30 Verifies allergies Im.220 Administers prescribed medications Im.220.2           Administers prescribed antibiotic therapy as ordered                              Entry 1                                                                                                          Time Administered         05/11/17 09:14:00               Medication                       INACTIVE ITEM ***                                                                                              zzBUPIVACAINE 0.25%                                                                                              EPINEPHRINE INJECTION                                                                                                 Route of Admin            Local Injection                 Dose/Volume                     10ml                                                              (  include amount and                                                               unit of measure)     Site                      Shoulder                        Administered By                 JULIAN DOUGLAS ELSIE FAIRY    Outcome Met (O.130)       Yes    Last Modified By:         GATLIN, RN, OLIVIA N                              05/11/17 09:14:30    Post-Care Text:            E.20 Evaluates response to medications O.130 Patient receives appropriately administerd medication(s)      [facility] - Implants/Endoscopy Stents                                                                Pre-Care Text:            A.20 Verifies operative procedure, surgical site, and laterality A.20.1 Verifies consent for planned procedure           Im.350 Records implants inserted during the operative or invasive procedure                              Entry 1                         Entry 2                         Entry 3                                          Implant/Explant           Implant  Implant                         Implant    Catalog #                [id number]                          [id number]                          [id number]    Implant     Identification      Description              ANCHOR JUGGERKNOT 2.9MM         ANCHOR JUGGERKNOT 2.9MM         ANCHOR Q-FIX ALL-SUTURE                              W/ 2 #2 MAXBRAID  WHITE         W/ MAXBRAID BLUE / WHITE        2.8MM                              & BLUE / WHITE     Expiration Date          02/22/22                        01/03/22                        01/24/20     Lot Number               [id number]                          [id number]                          [id number]     Unique ID Number      Manufacturer             Biomet                          Biomet                          [employer]     Serial Number     Usage Data      Implant Site             LEFT SHOULDER                   LEFT SHOULDER                   LEFT SHOULDER     Quantity                 1                               1  2    Last Modified By:         VERA RN, SCHUYLER LOISE VERA, RN, SCHUYLER LOISE VERA, RN, OLIVIA N                              05/11/17 09:40:10               05/11/17 09:40:10               05/11/17 09:55:00                                Entry 4                                                                                                          Implant/Explant           Implant    Catalog #                [id number]    Implant     Identification      Description              ANCHOR PEEK KNOTLESS                              QUATTRO LINK 4.5MM     Expiration Date          07/21/20     Lot Number               [id number]     Unique ID Number      Manufacturer             [employer]     Serial Number     Usage Data      Implant Site             left shoulder     Quantity                 2    Last Modified By:         VERA OBIE SCHUYLER LOISE                              05/11/17 10:43:09    Post-Care Text:            E.30 Evaluates verification process for correct patient, site, side and level surgery O.30 Patient's procedure           is performed on the correct site, side, and level      [facility] - Implants/Endoscopy Stents Audit  05/11/17 10:43:09         Owner: DEBBRAH                                Modifier: GATLOL                                                            4     <*> Description                            ANCHOR PEEK KNOTLESS QUATTRO LINK 4.5MM            4     <*> Quantity                               1     05/11/17 10:30:28         Owner: GATLOL                               Modifier: GATLOL                                                        <+> 4         Description        <+> 4         Lot Number        <+> 4         Manufacturer        <+> 4         Expiration Date        <+> 4         Implant Site        <+> 4         Quantity        <+> 4         Catalog #        <+> 4         Implant/Explant     05/11/17 09:55:00         Owner: GATLOL                               Modifier: GATLOL                                                            3     <*> Description                            ANCHOR Q-FIX ALL-SUTURE 2.8MM            3     <*> Quantity  1     05/11/17 09:51:45         Owner: DEBBRAH                               Modifier: GATLOL                                                        <+> 3         Description        <+> 3         Lot Number        <+> 3         Manufacturer        <+> 3         Expiration Date        <+> 3         Implant Site        <+> 3         Quantity        <+> 3         Catalog #        <+> 3         Implant/Explant        [facility] - Dressing/Packing                                                                         Pre-Care Text:            A.350 Assesses susceptibility for infection Im.250 Administers care to invasive devices Im.290 Administer care           to wound sites  Im.300 Implements aseptic technique                              Entry 1                                                                                                          Site                      Shoulder                        Site Details                    Left    Dressing Item     Details      Dressing Item  Medicated  Gauze, 4x4's,         Miscellaneous                   Shoulder     (Im.290)                 Tape, Occlusive Dressing        (Im.290)                        Immobilizer/Sling, Cold                                                                                              Therapy Pad    Last Modified By:         GATLIN, RN, SCHUYLER SAILOR                              05/11/17 09:50:55    Post-Care Text:            E.320 Evaluate factors associted with increased risk for postoperative infection at the completion of the           procedure O.200 Patient's wound perfusion is consistent with or improved from baseline levels  O.Patient is           free from signs and symptoms of infection      [facility] - Procedures                                                                               Pre-Care Text:            A.20 Verifies operative procedure, surgical site, and laterality Im.150 Develops individualized plan of care                              Entry 1                                                                                                          Procedure     Description      Procedure                Shoulder Arthroscopy            Modifiers  Left                              with Repair     Surgical Procedure       LEFT SHOULDER RTC     Text                     REPAIR / IMPINGEMENT    Primary Procedure         Yes                             Primary Surgeon                 JULIAN DOUGLAS ELSIE FAIRY    Start                     05/11/17 09:11:00               Stop                            05/11/17 11:07:00    Anesthesia Type           General                         Surgical Service                Orthopedic (SN)    Wound Class               1-Clean    Last Modified By:         GATLIN, RN, OLIVIA N                              05/11/17 11:07:16    Post-Care Text:            O.730 The  patinet's care is consistent with the individualized perioperative plan of care      [facility] - Procedures Audit                                                                          05/11/17 11:07:16         Owner: GATLOL                               Modifier: GATLOL                                                        <+>  1         Stop        [facility] - Transfer                                                                                                           Entry 1                                                                                                          Transferred By            DURELL HAVEN           Via                             Stretcher                              M, FERLA-MD,  BRIAN P,                              GATLIN, RN, SCHUYLER SAILOR    Post-op Destination       PACU    Skin Assessment      Condition                Intact    Last Modified By:         VERA OBIE SCHUYLER SAILOR                              05/11/17 11:04:59      Case Comments                                                                                         <None>              Finalized By: Lorella Jeoffrey HERO      Document Signatures  Signed By:           GATLIN, RN, OLIVIA N 05/11/17 11:21          Benton,  Amber M 05/11/17 13:04      Unfinalized History                                                                                     Date/Time            Username    Reason for Unfinalizing         Freetext Reason for Unfinalizing                                          05/11/17 13:01       [facility]   Charging

## 2017-05-11 NOTE — Discharge Summary (Signed)
 Inpatient Patient Summary               [facility]  [address] [place], [place] [address]  [phone]  Patient Discharge Instructions     Name: Oscar Turner, Oscar Turner  Current Date: 05/11/2017 11:52:40  DOB: November 14, 1946 [id number] FIN:NBR%[phone]  Patient Address: [address] [place] [address]  Patient Phone: [phone]  Primary Care Provider:  Name: LEONETTA ROSALYNN REDBIRD  Phone: [phone]   Immunizations Provided:      Discharge Diagnosis:   Discharged To: TO, ANTICIPATED%>  Home Treatments: TREATMENTS, ANTICIPATED%>  Devices/Equipment: EQUIPMENT REHAB%>  Post Hospital Services: HOSPITAL SERVICES%>  Professional Skilled Services: SKILLED SERVICES%>  Therapist, sports and Community Resources: SERV AND COMM RES, ANTICIPATED%>  Mode of Discharge Transportation: TRANSPORTATION%>  Discharge Orders:       Comment:   Medications  During the course of your visit, your medication list was updated with the most current information. The details of those changes are reflected below:         Medications That Were Updated - Follow Current Instructions  Other Medications  Current omega-3 polyunsaturated fatty acids (Fish Oil) 1,000 Milligram Oral (given by mouth) every day.  Last Dose:____________________    Medications that have not changed  Other Medications  acetaminophen  (Tylenol ) 1,000 Milligram Oral (given by mouth) every 6 hours as needed mild pain (1-3).  Last Dose:____________________  aspirin (Aspirin Low Dose 81 mg oral delayed release tablet) 1 Tabs Oral (given by mouth) every day.  Last Dose:____________________  ibuprofen  (Motrin ) 400 Milligram Oral (given by mouth) every 8 hours as needed mild pain (1-3).  Last Dose:____________________  multivitamin (Multiple Vitamins oral capsule) 1 Capsules Oral (given by mouth) every day.  Last Dose:____________________  testosterone  (Testosterone  Cypionate 200 mg/mL intramuscular solution) 1 Milliliter Intramuscular (in a muscle) every  other week.  Last Dose:____________________       [facility] would like to thank you for allowing us  to assist you with your healthcare needs. The following includes patient education materials and information regarding your injury/illness.   Scullion, Dontrelle S has been given the following list of follow-up instructions, prescriptions, and patient education materials:  Follow-up Instructions:             With: Address: When:   CARROLL-MD, DOUGLAS ELSIE PAC [address]  [place]  [address]  [phone] In 2 weeks   Comments:   Appointment Scheduled                It is important to always keep an active list of medications available so that you can share with other providers and manage your medications appropriately. As an additional courtesy, we are also providing you with your final active medications list that you can keep with you.           acetaminophen  (Tylenol ) 1,000 Milligram Oral (given by mouth) every 6 hours as needed mild pain (1-3).  aspirin (Aspirin Low Dose 81 mg oral delayed release tablet) 1 Tabs Oral (given by mouth) every day.  ibuprofen  (Motrin ) 400 Milligram Oral (given by mouth) every 8 hours as needed mild pain (1-3).  multivitamin (Multiple Vitamins oral capsule) 1 Capsules Oral (given by mouth) every day.  omega-3 polyunsaturated fatty acids (Fish Oil) 1,000 Milligram Oral (given by mouth) every day.  testosterone  (Testosterone  Cypionate 200 mg/mL intramuscular solution) 1 Milliliter Intramuscular (in a muscle) every other week.  Take only the medications listed above. Contact your doctor prior to taking any medications not on this list.  Discharge instructions, if any, will display below     Instructions for Diet: INSTRUCTIONS FOR DIET%>A Healthy Diet  Instructions for Supplements: SUPPLEMENT INSTRUCTIONS%>  Instructions for Activity: INSTRUCTIONS FOR ACTIVITY%>Other: The patient is to remain strictly nonweightbearing on the left upper extremity in a  sling. You should remove his arm from the sling to perform pendulum exercises 3 times a day for 5 minutes each. Work on elbow wrist and finger range of motion.  Instructions for Wound Care: INSTRUCTIONS FOR WOUND CARE%>Other: Keep your dressing clean and dry for total of 4 days. You may then remove your dressing and shower. Apply Band-Aids afterwards. Do not submerge incisions.     Medication leaflets, if any, will display below     Patient education materials, if any, will display below               Discharge Instructions: After Your Surgery  You've just had surgery. During surgery, you were given medicine called anesthesia to keep you relaxed and free of pain. After surgery, you may have some pain or nausea. This is common. Here are some tips for feeling better and getting well after surgery.     Stay on schedule with your medicine.   Going home  Your healthcare provider will show you how to take care of yourself when you go home. He or she will also answer your questions. Have an adult family member or friend drive you home. For the first 24 hours after your surgery:   Do not drive or use heavy equipment.   Do not make important decisions or sign legal papers.   Do not drink alcohol.   Have someone stay with you, if needed. He or she can watch for problems and help keep you safe.  Be sure to go to all follow-up visits with your healthcare provider. And rest after your surgery for as long as your healthcare provider tells you to.  Coping with pain  If you have pain after surgery, pain medicine will help you feel better. Take it as told, before pain becomes severe. Also, ask your healthcare provider or pharmacist about other ways to control pain. This might be with heat, ice, or relaxation. And follow any other instructions your surgeon or nurse gives you.  Tips for taking pain medicine  To get the best relief possible, remember these points:   Pain medicines can upset your stomach. Taking them with a little  food may help.   Most pain relievers taken by mouth need at least 20 to 30 minutes to start to work.   Taking medicine on a schedule can help you remember to take it. Try to time your medicine so that you can take it before starting an activity. This might be before you get dressed, go for a walk, or sit down for dinner.   Constipation is a common side effect of pain medicines. Call your healthcare provider before taking any medicines such as laxatives or stool softeners to help ease constipation. Also ask if you should skip any foods. Drinking lots of fluids and eating foods such as fruits and vegetables that are high in fiber can also help. Remember, do not take laxatives unless your surgeon has prescribed them.   Drinking alcohol and taking pain medicine can cause dizziness and slow your breathing. It can even be deadly. Do not drink alcohol while taking pain  medicine.   Pain medicine can make you react more slowly to things. Do not drive or run machinery while taking pain medicine.  Your healthcare provider may tell you to take acetaminophen  to help ease your pain. Ask him or her how much you are supposed to take each day. Acetaminophen  or other pain relievers may interact with your prescription medicines or other over-the-counter (OTC) medicines. Some prescription medicines have acetaminophen  and other ingredients. Using both prescription and OTC acetaminophen  for pain can cause you to overdose. Read the labels on your OTC medicines with care. This will help you to clearly know the list of ingredients, how much to take, and any warnings. It may also help you not take too much acetaminophen . If you have questions or do not understand the information, ask your pharmacist or healthcare provider to explain it to you before you take the OTC medicine.  Managing nausea  Some people have an upset stomach after surgery. This is often because of anesthesia, pain, or pain medicine, or the stress of surgery. These  tips will help you handle nausea and eat healthy foods as you get better. If you were on a special food plan before surgery, ask your healthcare provider if you should follow it while you get better. These tips may help:   Do not push yourself to eat. Your body will tell you when to eat and how much.   Start off with clear liquids and soup. They are easier to digest.   Next try semi-solid foods, such as mashed potatoes, applesauce, and gelatin, as you feel ready.   Slowly move to solid foods. Don't eat fatty, rich, or spicy foods at first.   Do not force yourself to have 3 large meals a day. Instead eat smaller amounts more often.   Take pain medicines with a small amount of solid food, such as crackers or toast, to avoid nausea.     Call your surgeon if.   You still have pain an hour after taking medicine. The medicine may not be strong enough.   You feel too sleepy, dizzy, or groggy. The medicine may be too strong.   You have side effects like nausea, vomiting, or skin changes, such as rash, itching, or hives.      If you have obstructive sleep apnea  You were given anesthesia medicine during surgery to keep you comfortable and free of pain. After surgery, you may have more apnea spells because of this medicine and other medicines you were given. The spells may last longer than usual.   At home:   Keep using the continuous positive airway pressure (CPAP) device when you sleep. Unless your healthcare provider tells you not to, use it when you sleep, day or night. CPAP is a common device used to treat obstructive sleep apnea.   Talk with your provider before taking any pain medicine, muscle relaxants, or sedatives. Your provider will tell you about the possible dangers of taking these medicines.     2000-2017 [employer] [address] [place], [place] [address]. All rights reserved. This information is not intended as a substitute for professional medical care. Always follow your  healthcare professional's instructions.                 Discharge Instructions for Rotator Cuff Repair  You had a procedure called rotator cuff repair. The rotator cuff consists of the muscles and tendons that surround your shoulder. The rotator cuff keeps the top of  your upper arm bone (humerus) securely in the shoulder joint. Your doctor made an incision near your shoulder blade and repaired the torn muscles or tendons in your shoulder. Here are instructions to follow when caring for your arm at home.  Activity     You may be told to do daily "pendulum swings" to improve your joint's flexibility. Use your torso to move your arm in a circle as it hangs straight down, first in one direction, then the other.    After surgery, rest your arm and relax for the rest of day.  You will be non-weightbearing on the extremity until you see your doctor. You may move your elbow, wrist and fingers.   If you had general anesthesia (were put to sleep for the procedure), don't operate power tools or machinery, drink alcohol, or make any major decisions for at least 24 hours following surgery.   Wear your sling, brace, or immobilizer, as directed.   Don't drive a car until your doctor says it's OK. And never drive while taking opioid pain medication.   Flex your wrist and wiggle your fingers often to help blood flow.   Unless your doctor tells you otherwise, do pendulum exercises with your affected arm, starting 1 day(s) after your surgery:   Hold on to the back of a chair, or lean on a tabletop with your healthy hand.   Let your arm hang straight down toward the floor and use your torso to move your affected arm in a circle. First do 20 circles in one direction. Then do 20 circles in the other direction.   Repeat the pendulum exercise every 2 hour(s) while you are awake. When you feel ready, increase the number of circles to 50 in each direction every 2 hour(s).  Incision care   Don't soak in a bathtub, hot tub, or pool  until your doctor says it's OK.   Wait 4 day(s) after your surgery to begin showering. Then shower as needed. Carefully wash your incision with soap and water. Gently pat it dry. Don't rub the incision, or apply creams or lotions.   Your incision was closed using sutures. If you have sutures, they may need to be removed 2-3 weeks after surgery. Allow the strips of tape to fall off on their own.  Other home care   Use pain medication as directed by your doctor.   Apply an ice pack or bag of frozen peas-or something similar-wrapped in a thin towel on your shoulder to reduce swelling for the first 48 hours. Leave the ice pack on for 20 minutes; then take it off for 20 minutes. Repeat as needed.   Take your temperature daily for 7 days after your surgery. Report a fever above 100.63F (38C)  to your doctor. Fever may be a sign of infection.  Follow-up  The office has given you a follow up appointment for 2 weeks from your surgery.     When to seek medical attention  Call 911 right away if you have any of the following:   Chest pain   Shortness of breath  Otherwise, call your doctor immediately if you have any of the following:   Increasing shoulder pain or pain not relieved by medication   Pain or swelling in the arm on the side of your surgery   Numbness, tingling, coolness, or blue-gray color of your arm or fingers on the side of your surgery   Fever above 100.63F (38.0C) or shaking chills  Drainage or oozing, redness, or warmth at the incision   Nausea or vomiting     2000-2015 [employer] [address], [place], [place] [address]. All rights reserved. This information is not intended as a substitute for professional medical care. Always follow your healthcare professional's instructions.          IS IT A STROKE? Act FAST and Check for these signs:    FACE                         Does the face look uneven?    ARM                         Does one arm drift down?    SPEECH                     Does their speech sound strange?    TIME                         Call 9-1-1 at any sign of stroke  Heart Attack Signs  Chest discomfort: Most heart attacks involve discomfort in the center of the chest and lasts more than a few minutes, or goes away and comes back. It can feel like uncomfortable pressure, squeezing, fullness or pain.  Discomfort in upper body: Symptoms can include pain or discomfort in one or both arms, back, neck, jaw or stomach.  Shortness of breath: With or without discomfort.  Other signs: Breaking out in a cold sweat, nausea, or lightheaded.  Remember, MINUTES DO MATTER. If you experience any of these heart attack warning signs, call 9-1-1 to get immediate medical attention!     ---------------------------------------------------------------------------------------------------------------------  [facility] allows you to manage your health, view your test results, and retrieve your discharge documents from your hospital stay securely and conveniently from your computer.  To begin the enrollment process, visit [email or web address] Click on "Sign up now" under [facility].

## 2017-05-11 NOTE — Discharge Summary (Signed)
 Inpatient Clinical Summary             [facility]  Post-Acute Care Transfer Instructions  PERSON INFORMATION   Name: Oscar Turner, Oscar Turner  MRN: [id number]    FIN#: [id number]   PHYSICIANS  Admitting Physician: JULIAN DOUGLAS ELSIE FAIRY  Attending Physician: JULIAN DOUGLAS ELSIE FAIRY   PCP: DUNN-MD,  TED ALAN  Discharge Diagnosis:    Comment:       PATIENT EDUCATION INFORMATION  Instructions:             Anesthesia: After Your Surgery; Arthroscopic Rotator Cuff Repair, Discharge Instructions ([id number])  Medication Leaflets:               Follow-up:                          With: Address: When:   CARROLL-MD, DOUGLAS ELSIE FAIRY [address]  [place]  [address]  [phone] In 2 weeks   Comments:   Appointment Scheduled                           MEDICATION LIST  Medication Reconciliation at Discharge:         Medications That Were Updated - Follow Current Instructions  Other Medications  Current omega-3 polyunsaturated fatty acids (Fish Oil) 1,000 Milligram Oral (given by mouth) every day.  Last Dose:____________________    Medications that have not changed  Other Medications  acetaminophen  (Tylenol ) 1,000 Milligram Oral (given by mouth) every 6 hours as needed mild pain (1-3).  Last Dose:____________________  aspirin (Aspirin Low Dose 81 mg oral delayed release tablet) 1 Tabs Oral (given by mouth) every day.  Last Dose:____________________  ibuprofen  (Motrin ) 400 Milligram Oral (given by mouth) every 8 hours as needed mild pain (1-3).  Last Dose:____________________  multivitamin (Multiple Vitamins oral capsule) 1 Capsules Oral (given by mouth) every day.  Last Dose:____________________  testosterone  (Testosterone  Cypionate 200 mg/mL intramuscular solution) 1 Milliliter Intramuscular (in a muscle) every other week.  Last Dose:____________________         Patient's Final Home Medication List Upon Discharge:          acetaminophen  (Tylenol ) 1,000 Milligram Oral (given by  mouth) every 6 hours as needed mild pain (1-3).  aspirin (Aspirin Low Dose 81 mg oral delayed release tablet) 1 Tabs Oral (given by mouth) every day.  ibuprofen  (Motrin ) 400 Milligram Oral (given by mouth) every 8 hours as needed mild pain (1-3).  multivitamin (Multiple Vitamins oral capsule) 1 Capsules Oral (given by mouth) every day.  omega-3 polyunsaturated fatty acids (Fish Oil) 1,000 Milligram Oral (given by mouth) every day.  testosterone  (Testosterone  Cypionate 200 mg/mL intramuscular solution) 1 Milliliter Intramuscular (in a muscle) every other week.         Comment:       ORDERS         Order Name Order Details

## 2017-05-12 NOTE — Nursing Note (Signed)
Medication Administration Follow Up-Text       Medication Administration Follow Up Entered On:  05/12/2017 20:59 EDT    Performed On:  05/12/2017 20:13 EDT by Brynda Greathouse, RN, Sonja A      Intervention Information:     hydromorphone  Performed by Brynda Greathouse, RN, Sonja A on 05/12/2017 19:58:00 EDT       hydromorphone,0.5mg   IV Push,Antecubital, Right       Med Response   ED Medication Response :   No adverse reaction, Symptoms improved   Numeric Rating Pain Scale :   5 = Moderate pain   Pasero Opioid Induced Sedation Scale :   1 = Awake and alert   Koeckeritz, RN, Becky Sax A - 05/12/2017 20:59 EDT

## 2017-05-12 NOTE — Nursing Note (Signed)
Medication Administration Follow Up-Text       Medication Administration Follow Up Entered On:  05/12/2017 19:59 EDT    Performed On:  05/12/2017 19:59 EDT by Brynda Greathouse, RN, Sonja A      Intervention Information:     ketorolac  Performed by Brynda Greathouse, RN, Sonja A on 05/12/2017 19:43:00 EDT       ketorolac,15mg   IV Push,Antecubital, Right       Med Response   ED Medication Response :   No adverse reaction, Symptoms improved   Numeric Rating Pain Scale :   8   Pasero Opioid Induced Sedation Scale :   1 = Awake and alert   Koeckeritz, RN, Becky Sax A - 05/12/2017 19:59 EDT

## 2017-05-12 NOTE — Nursing Note (Signed)
Medication Administration Follow Up-Text       Medication Administration Follow Up Entered On:  05/12/2017 20:59 EDT    Performed On:  05/12/2017 20:38 EDT by Brynda Greathouse, RN, Sonja A      Intervention Information:     hydromorphone  Performed by Brynda Greathouse, RN, Sonja A on 05/12/2017 20:23:00 EDT       hydromorphone,0.5mg   IV Push,Antecubital, Right       Med Response   ED Medication Response :   No adverse reaction, No change in symptoms   Numeric Rating Pain Scale :   5 = Moderate pain   Pasero Opioid Induced Sedation Scale :   1 = Awake and alert   Koeckeritz, RN, Becky Sax A - 05/12/2017 20:59 EDT

## 2017-05-18 DIAGNOSIS — Z6825 Body mass index (BMI) 25.0-25.9, adult: Secondary | ICD-10-CM | POA: Diagnosis not present

## 2017-05-18 DIAGNOSIS — Z23 Encounter for immunization: Secondary | ICD-10-CM | POA: Diagnosis not present

## 2017-05-25 NOTE — Nursing Note (Signed)
Adult Patient History Form-Text       Adult Patient History Entered On:  05/25/2017 18:17 EDT    Performed On:  05/25/2017 18:11 EDT by Heide Guile, RN, [place]   Patient Identified 2018 :   Identification band, Verbal   Patient Identified :   Oscar Turner   Information Given By 2018 :   Self, Spouse   Preferred Mode of Communication :   Verbal   Accompanied By 2018 :   Spouse   In Charge of News (ICON) Name :   Wife [id number] [place]    Pregnancy Status :   N/A   In Charge of News (ICON) Code :   1829   Has the patient received chemotherapy or biotherapy within the last 48 hours? :   No   In Clinical Trial With Signed Consent for Related Condition :   No signed consent for clinical trial   Is the patient currently (2-3 days) receiving radiation treatment? :   No   Heide Guile, RN, ANDREW J - 05/25/2017 18:11 EDT   Allergies   (As Of: 05/25/2017 18:17:03 EDT)   Allergies (Active)   No Known Allergies  Estimated Onset Date:   Unspecified ; Created By:   Lavera Guise; Reaction Status:   Active ; Category:   Drug ; Substance:   No Known Allergies ; Type:   Allergy ; Updated By:   Lavera Guise; Source:   Patient ; Reviewed Date:   05/25/2017 18:12 EDT        Medication History   Medication List   (As Of: 05/25/2017 18:17:03 EDT)   Normal Order    aspirin 81 mg DR Tab  :   aspirin 81 mg DR Tab ; Status:   Ordered ; Ordered As Mnemonic:   aspirin ; Simple Display Line:   81 mg, 1 tabs, Oral, Daily ; Ordering Provider:   Jon Billings, MD, Latricia Heft.; Catalog Code:   aspirin ; Order Dt/Tm:   05/25/2017 15:27:10          A Patient Specific Medication  :   A Patient Specific Medication ; Status:   Ordered ; Ordered As Mnemonic:   A Patient Specific Medication ; Simple Display Line:   1 EA, Kit-Combo, q7mn, PRN: other (see comment) ; Ordering Provider:   CJon Billings MD, JLatricia Heft; Catalog Code:   A Patient Specific Medication ; Order Dt/Tm:   05/25/2017 17:17:55 ; Comment:   to access the patient specific medication drawer          A  Patient Specific Refrigerated Medication  :   A Patient Specific Refrigerated Medication ; Status:   Ordered ; Ordered As Mnemonic:   A Patient Specific Refrigerated Medication ; Simple Display Line:   1 EA, Kit-Combo, q528m, PRN: other (see comment) ; Ordering Provider:   CiJon BillingsMD, JoLatricia Heft Catalog Code:   A Patient Specific Refrigerated Medicati ; Order Dt/Tm:   05/25/2017 17:17:55 ; Comment:   to access the patient specific Refrigerated medications          Delivery and Return [place]  :   Del[place] and [place] ; Status:   Ordered ; Ordered As Mnemonic:   Delivery and Return Bin Access ; Simple Display Line:   1 EA, Kit-Combo, q5m33m PRN: other (see comment) ; Ordering Provider:   CicJon BillingsD, JohLatricia HeftCatalog Code:   Delivery  and Return [employer] ; Order Dt/Tm:   05/25/2017 17:17:55 ; Comment:   This code grants access to the [employer] for the Delivery and Return Bin Access          lidocaine 1% PF Inj Soln 2 mL  :   lidocaine 1% PF Inj Soln 2 mL ; Status:   Ordered ; Ordered As Mnemonic:   lidocaine 1% preservative-free injectable solution ; Simple Display Line:   0.5 mL, ID, q21mn, PRN: other (see comment) ; Ordering Provider:   CJon Billings MD, JLatricia Heft; Catalog Code:   lidocaine ; Order Dt/Tm:   05/25/2017 17:17:56 ; Comment:   to access lidocaine 1%  2 mL vial for IV start and Life Port access          Respiratory MDI Treatment  :   Respiratory MDI Treatment ; Status:   Ordered ; Ordered As Mnemonic:   Respiratory MDI Treatment ; Simple Display Line:   1 EA, Kit-Combo, q5103m, PRN: other (see comment) ; Ordering Provider:   CiJon BillingsMD, JoLatricia Heft Catalog Code:   Respiratory MDI Treatment ; Order Dt/Tm:   05/25/2017 17:17:56          sodium chloride 0.9% Inj Soln 10 mL syringe  :   sodium chloride 0.9% Inj Soln 10 mL syringe ; Status:   Ordered ; Ordered As Mnemonic:   sodium chloride 0.9% flush syringe range dose ; Simple Display Line:   30 mL, IV Push, q5m88m PRN: other (see comment) ;  Ordering Provider:   CicJon BillingsD, JohLatricia HeftCatalog Code:   sodium chloride flush ; Order Dt/Tm:   05/25/2017 17:17:56 ; Comment:   for access to sodium chloride 0.9% syringe for INT flush if needed          sodium chloride 0.9% Inj Soln 10 mL vial  :   sodium chloride 0.9% Inj Soln 10 mL vial ; Status:   Ordered ; Ordered As Mnemonic:   sodium chloride 0.9% vial for reconstitution range dose ; Simple Display Line:   30 mL, IV Push, q5mi66mPRN: other (see comment) ; Ordering Provider:   CiccJon Billings, JohnLatricia Heftatalog Code:   sodium chloride flush ; Order Dt/Tm:   05/25/2017 17:17:56 ; Comment:   for access to sodium chloride 0.9% vial when needed as a diluent for reconstitutable medications          sterile water Inj Soln 10 mL  :   sterile water Inj Soln 10 mL ; Status:   Ordered ; Ordered As Mnemonic:   sterile water for reconstitution ; Simple Display Line:   10 mL, N/A, q5min7mRN: other (see comment) ; Ordering Provider:   CiccoJon Billings John Latricia Hefttalog Code:   sterile water for reconstitution ; Order Dt/Tm:   05/25/2017 17:17:57 ; Comment:   to access sterile water when needed as a diluent for reconstitutable medications. Not for IV use.          adenosine  :   adenosine ; Status:   Discontinued ; Ordered As Mnemonic:   adenosine (diagnostic) ; Simple Display Line:   3 mg, 1 mL, 465 mL/hr, IV Push, Once ; Ordering Provider:   CiccoJon Billings John Latricia Hefttalog Code:   adenosine ; Order Dt/Tm:   05/25/2017 14:42:16 ; Comment:   Rate = 140 mcg/kg/min x 4 minutes; QS with normal saline to 31mL.15md in 60mL s51mge. Max dose for table is '89mg'$ . For patients greater than 350 pounds,  use Lexiscan.  Target Dose: adenosine (diagnostic) 0.56 mg/kg  10/03/2015 09:56:41          aspirin 81 mg Chew Tab  :   aspirin 81 mg Chew Tab ; Status:   Completed ; Ordered As Mnemonic:   aspirin ; Simple Display Line:   324 mg, 4 tabs, Chewed, Once ; Ordering Provider:   Rock Nephew; Catalog Code:   aspirin ; Order Dt/Tm:   05/25/2017  11:19:38            Home Meds    ibuprofen  :   ibuprofen ; Status:   Documented ; Ordered As Mnemonic:   Advil 200 mg oral tablet ; Simple Display Line:   400 mg, 2 tabs, Oral, q4hr, PRN: for pain, 120 tabs, 0 Refill(s) ; Catalog Code:   ibuprofen ; Order Dt/Tm:   05/25/2017 11:16:36          acetaminophen-oxyCODONE  :   acetaminophen-oxyCODONE ; Status:   Completed ; Ordered As Mnemonic:   oxyCODONE-acetaminophen 7.5 mg-325 mg oral tablet range dose ; Simple Display Line:   0 Refill(s) ; Catalog Code:   acetaminophen-oxyCODONE ; Order Dt/Tm:   05/12/2017 19:22:56 ; Comment:   MAX DAILY DOSE OF ACETAMINOPHEN = 4000 MG          ondansetron  :   ondansetron ; Status:   Discontinued ; Ordered As Mnemonic:   ondansetron 4 mg oral tablet, disintegrating ; Simple Display Line:   4 mg, 1 tabs, Oral, Once, 10 tabs, 0 Refill(s) ; Catalog Code:   ondansetron ; Order Dt/Tm:   05/12/2017 19:22:56          ibuprofen  :   ibuprofen ; Status:   Completed ; Ordered As Mnemonic:   Motrin ; Simple Display Line:   400 mg, Oral, q8hr, PRN: mild pain (1-3), 0 Refill(s) ; Catalog Code:   ibuprofen ; Order Dt/Tm:   05/05/2017 11:15:51          aspirin  :   aspirin ; Status:   Documented ; Ordered As Mnemonic:   Aspirin Low Dose 81 mg oral delayed release tablet ; Simple Display Line:   81 mg, 1 tabs, Oral, Daily, 30 tabs, 0 Refill(s) ; Catalog Code:   aspirin ; Order Dt/Tm:   04/07/2016 14:05:26          multivitamin  :   multivitamin ; Status:   Documented ; Ordered As Mnemonic:   Multiple Vitamins oral capsule ; Simple Display Line:   1 caps, Oral, Daily, 30 caps, 0 Refill(s) ; Catalog Code:   multivitamin ; Order Dt/Tm:   04/07/2016 14:05:11          omega-3 polyunsaturated fatty acids  :   omega-3 polyunsaturated fatty acids ; Status:   Documented ; Ordered As Mnemonic:   Fish Oil ; Simple Display Line:   1,000 mg, Oral, Daily, 0 Refill(s) ; Catalog Code:   omega-3 polyunsaturated fatty acids ; Order Dt/Tm:   04/07/2016 14:05:19           testosterone  :   testosterone ; Status:   Documented ; Ordered As Mnemonic:   Testosterone Cypionate 200 mg/mL intramuscular solution ; Simple Display Line:   200 mg, 1 mL, IM, q2wk, 0 Refill(s) ; Ordering Provider:   Farrel Conners; Catalog Code:   testosterone ; Order Dt/Tm:   04/07/2016 14:05:42            Problem History   (  As Of: 05/25/2017 18:17:03 EDT)   Problems(Active)    Alteration in comfort: pain (SNOMED CT  :30160109 )  Name of Problem:   Alteration in comfort: pain ; Recorder:   SYSTEM,  SYSTEM; Confirmation:   Confirmed ; Classification:   Interdisciplinary ; Code:   [id number] ; Last Updated:   05/25/2017 17:18 EDT ; Life Cycle Date:   05/25/2017 ; Life Cycle Status:   Active ; Vocabulary:   SNOMED CT   ; Comments:        05/25/2017 17:18 - SYSTEM,  SYSTEM  Problem added automatically by system based on initiation of Alteration in Comfort Plan of Care      At risk for injury (SNOMED CT  :202542706 )  Name of Problem:   At risk for injury ; Recorder:   SYSTEM,  SYSTEM; Confirmation:   Confirmed ; Classification:   Interdisciplinary ; Code:   [id number] ; Last Updated:   05/25/2017 17:18 EDT ; Life Cycle Date:   05/25/2017 ; Life Cycle Status:   Active ; Vocabulary:   SNOMED CT   ; Comments:        05/25/2017 17:18 - SYSTEM,  SYSTEM  Problem added automatically by system based on initiation of Risk for Injury Plan of Care      Basal cell carcinoma (SNOMED CT  :176160737 )  Name of Problem:   Basal cell carcinoma ; Recorder:   DAVIS, RN, JOY M; Confirmation:   Confirmed ; Classification:   Patient Stated ; Code:   106269485 ; Contributor System:   Conservation officer, nature ; Last Updated:   05/05/2017 11:17 EDT ; Life Cycle Date:   05/05/2017 ; Life Cycle Status:   Active ; Vocabulary:   SNOMED CT        History of prostate cancer (SNOMED CT  :4627035009 )  Name of Problem:   History of prostate cancer ; Recorder:   West,  Sarah E; Confirmation:   Confirmed ; Classification:   Patient Stated ; Code:   [id number] ; Contributor  System:   Conservation officer, nature ; Last Updated:   04/07/2016 14:05 EDT ; Life Cycle Date:   04/07/2016 ; Life Cycle Status:   Active ; Vocabulary:   SNOMED CT        Shoulder pain (SNOMED CT  :69678938 )  Name of Problem:   Shoulder pain ; Recorder:   DAVIS, RN, JOY M; Confirmation:   Confirmed ; Classification:   Patient Stated ; Code:   10175102 ; Contributor System:   Conservation officer, nature ; Last Updated:   05/05/2017 11:16 EDT ; Life Cycle Date:   05/05/2017 ; Life Cycle Status:   Active ; Vocabulary:   SNOMED CT   ; Comments:        05/05/2017 11:16 - DAVIS, RN, JOY M  LEFT SHOULDER. DECREASED ROM      Torn rotator cuff (SNOMED CT  :5852778242 )  Name of Problem:   Torn rotator cuff ; Recorder:   DAVIS, RN, JOY M; Confirmation:   Confirmed ; Classification:   Patient Stated ; Code:   [id number] ; Contributor System:   Conservation officer, nature ; Last Updated:   05/05/2017 11:17 EDT ; Life Cycle Date:   05/05/2017 ; Life Cycle Status:   Active ; Vocabulary:   SNOMED CT          Diagnoses(Active)    Chest pain  Date:   05/25/2017 ; Diagnosis Type:   Reason For Visit ; Confirmation:   Complaint of ; Clinical Dx:  Chest pain ; Classification:   Medical ; Clinical Service:   Emergency medicine ; Code:   PNED ; Probability:   0 ; Diagnosis Code:   [id number]      Chest pain  Date:   05/25/2017 ; Diagnosis Type:   Discharge ; Confirmation:   Confirmed ; Clinical Dx:   Chest pain ; Classification:   Medical ; Clinical Service:   Non-Specified ; Code:   ICD-10-CM ; Probability:   0 ; Diagnosis Code:   R07.9        Procedure History        -    Procedure History   (As Of: 05/25/2017 18:17:03 EDT)     Procedure Dt/Tm:   2007 ; Anesthesia Minutes:   0 ; Procedure Name:   Colonoscopy ; Procedure Minutes:   0 ; Last Reviewed Dt/Tm:   05/25/2017 18:14:03 EDT            Procedure Dt/Tm:   02/2011 ; Anesthesia Minutes:   0 ; Procedure Name:   Radical prostatectomy ; Procedure Minutes:   0 ; Last Reviewed Dt/Tm:   05/25/2017 18:14:03 EDT             Anesthesia Minutes:   0 ; Procedure Name:   Tonsillectomy ; Procedure Minutes:   0 ; Last Reviewed Dt/Tm:   05/25/2017 18:14:03 EDT            Procedure Dt/Tm:   08/2014 ; Anesthesia Minutes:   0 ; Procedure Name:   L4 wedge compression fracture, sequela ; Procedure Minutes:   0 ; Last Reviewed Dt/Tm:   05/25/2017 18:14:03 EDT            Procedure Dt/Tm:   06/20/2016 08:12:00 EDT ; Location:   [facility] ; Provider:   Joanette Gula; Anesthesia Type:   Monitored Anesthesia Care ; :   MILLIRON-MD,  MATTHEW J; Anesthesia Minutes:   0 ; Procedure Name:   Colonoscopy with Biopsy / Brush ; Procedure Minutes:   17 ; Comments:     06/20/2016 08:40 - PHILLIPS, RN, SHARON H  auto-populated from documented surgical case ; Clinical Service:   Surgery ; Last Reviewed Dt/Tm:   05/25/2017 18:14:03 EDT            Anesthesia Minutes:   0 ; Procedure Name:   Mohs surgery ; Procedure Minutes:   0 ; Comments:     05/05/2017 11:17 - DAVIS, RN, [place] ; Last Reviewed Dt/Tm:   05/25/2017 18:14:03 EDT            Procedure Dt/Tm:   05/11/2017 09:11:00 EDT ; Location:   [facility] ; Provider:   Girard Cooter; Anesthesia Type:   General ; :   FERLA-MD,  BRIAN P; Anesthesia Minutes:   0 ; Procedure Name:   Shoulder Arthroscopy with Repair (Left) ; Procedure Minutes:   116 ; Comments:     05/11/2017 11:21 - GATLIN, RN, OLIVIA N  auto-populated from documented surgical case ; Clinical Service:   Surgery ; Last Reviewed Dt/Tm:   05/25/2017 18:14:03 EDT            Immunizations   Influenza Vaccine Status :   Received prior to admission, during current flu season   Last Tetanus :   Sherlyn Lees, RN, ANDREW J - 05/25/2017 18:11 EDT   ID Risk Screen   Chills :   No   Cough (Any Duration) :  No   Fever :   No   Hemoptysis (Blood in Sputum) :   No   Night Sweats :   No   Weight Loss Greater Than 10 Pounds :   No   Hx of TB Now or at Any Time In the Past (Even if on Meds) :   No   Foreign-Born :   No   Homeless or In Shelter :   No    Incarcerated Within Last 2 Years :   No   Intravenous Drug User :   No   Male Homosexual :   No   New TST/IGRA Results Pos(Within 2 yrs),Hx Recent TB Exposure :   No   WILDE, RN, ANDREW J - 05/25/2017 18:11 EDT   3 or more loose/watery stools :   No   MRSA/VRE Screening :   None of these apply   Patient Recent Travel History :   No recent travel   Family Member Travel History :   No recent travel   [place], RN, Marca Ancona - 05/25/2017 18:11 EDT   Infectious Disease Risk Factor Grid   Chills :   No   Fever :   No   Fatigue :   No   Headache :   No   Runny or Stuffy Nose :   No   Sore Throat :   No   Difficulty Breathing :   No   Shortness of Breath :   No   New or Worsening Cough :   No   Wheezing :   No   Vomiting :   No   Diarrhea :   No   Abdominal (Stomach Pain) :   No   Muscle Pain :   No   Weakness/Numbness :   No   Recent Exposure to Communicable Disease :   No   Illness With Generalized Rash :   No   Abnormal Bleeding :   No   Unexplained Hemorrhage (Bleeding or Bruising) :   No   Arthralgia :   No   Conjunctivitis :   No   Heide Guile, RN, ANDREW J - 05/25/2017 18:11 EDT   Bloodless Medicine   Will Patient Accept Blood Transfusion and/or Blood Products :   Yes   WILDE, RN, ANDREW J - 05/25/2017 18:11 EDT   Nutrition   Nutritional Risk Factors :   None   Usual Weight :   77 kg(Converted to: 169 lb 12 oz, 169.76 lb, 2,716.10 oz)    Heide Guile, RN, ANDREW J - 05/25/2017 18:11 EDT   Social History   Social History   (As Of: 05/25/2017 18:17:03 EDT)   Tobacco:        Former smoker, Pipe   Comments:  05/05/2017 11:08 - DAVIS, RN, JOY M: QUIT 2016   (Last Updated: 05/25/2017 11:17:05 EDT by Ardis Hughs, RN, Wilfred Lacy)          Alcohol:        Current, Wine, 3-5 times per week, 1 Number of Drinks Per Day.   (Last Updated: 05/05/2017 11:09:08 EDT by Rosana Hoes, RN, [place])          Substance Abuse:        Denies   (Last Updated: 01/01/2017 09:14:29 EST by CRAWFORD, RN, GREGORY A)            Spiritual   Do you have a concern that you would like to address with a  Chaplain? :  No   Do you have any religious/spiritual/cultural beliefs that could impact the way your care is provided? :   No   Heide Guile, RN, Marca Ancona - 05/25/2017 18:11 EDT   Harm Screen   Feels Unsafe at Home :   No   Recent Life Events :   None   Previous Mental Illness Diagnosis :   None   Suicidal Behavior :   None   Self Harming Behavior :   None   Suicidal Ideation :   None   Heide Guile, RN, ANDREW J - 05/25/2017 18:11 EDT   Advance Directive   Advance Directive :   Yes   Type of Advance Directive :   Living will, Medical durable power of attorney   Florentina Addison - 05/25/2017 18:11 EDT   Education   Written Language :   Trena Platt, RN, ANDREW J - 05/25/2017 18:11 EDT   Caregiver/Advocate Language   Patient :   None   Family :   None   Heide Guile, RN, ANDREW J - 05/25/2017 18:11 EDT   Barriers to Learning :   None evident   Teaching Method :   Explanation   Responsible Learner Present for Session :   Josetta Huddle, RN, ANDREW J - 05/25/2017 18:11 EDT   DCP GENERIC CODE   Unit/Room Orientation :   Rosezena Sensor understanding   Environmental Safety :   Verbalizes understanding   Hand Washing :   Verbalizes understanding   Infection Prevention :   Verbalizes understanding   DVT Prophylaxis :   Verbalizes understanding   Isolation Precaution :   Verbalizes understanding   Heide Guile, RN, ANDREW J - 05/25/2017 18:11 EDT   DC Needs   Living Situation :   Home independently   Heide Guile RN, Marca Ancona - 05/25/2017 18:11 EDT   Valuables and Belongings   Does Patient Have Valuables and Belongings :   Yes   Heide Guile, RN, ANDREW J - 05/25/2017 18:11 EDT   DCP GENERIC CODE   At Bedside :   Cell phone, Clothes, Money   Heide Guile, RN, ANDREW J - 05/25/2017 18:11 EDT   Admission Complete   Admission Complete :   Yes   Heide Guile, RN, ANDREW J - 05/25/2017 18:11 EDT

## 2017-05-26 NOTE — Discharge Summary (Signed)
 Inpatient Patient Summary               [facility]  [address] [place], [place] [address]  [phone]  Patient Discharge Instructions     Name: Oscar Turner, Oscar Turner  Current Date: 05/26/2017 14:59:42  DOB: August 19, 1946 [id number] FIN:[id number]%>[phone]  Patient Address: [address] [place] [address]  Patient Phone: [phone]  Primary Care Provider:  Name: Oscar Turner  Phone: [phone]   Immunizations Provided:      Discharge Diagnosis: Chest pain  Discharged To: TO, ANTICIPATED%>Home independently  Home Treatments: TREATMENTS, ANTICIPATED%>  Devices/Equipment: EQUIPMENT REHAB%>  Post Hospital Services: HOSPITAL SERVICES%>  Professional Skilled Services: SKILLED SERVICES%>  Therapist, sports and Community Resources: SERV AND COMM RES, ANTICIPATED%>  Mode of Discharge Transportation: TRANSPORTATION%>Private vehicle  Discharge Orders:         Discharge Patient 05/26/17 14:36:00 EDT, Discharge Home/Self Care         Comment:   Medications  During the course of your visit, your medication list was updated with the most current information. The details of those changes are reflected below:         Medications that have not changed  Other Medications  omega-3 polyunsaturated fatty acids (Fish Oil) 1,000 Milligram Oral (given by mouth) every day.  Last Dose:____________________  testosterone  (Testosterone  Cypionate 200 mg/mL intramuscular solution) 1 Milliliter Intramuscular (in a muscle) every other week.  Last Dose:____________________  No Longer Take the Following Medications  aspirin (Aspirin Low Dose 81 mg oral delayed release tablet) 1 Tabs Oral (given by mouth) every day.  Stop Taking Reason: Physician Request  ibuprofen  (Advil  200 mg oral tablet) 2 Tabs Oral (given by mouth) every 4 hours as needed for pain.  Stop Taking Reason: Physician Request  multivitamin (Multiple Vitamins oral capsule) 1 Capsules Oral (given by mouth) every day.  Stop Taking Reason: Physician  Request  ondansetron (ondansetron 4 mg oral tablet, disintegrating) 1 Tabs Oral (given by mouth) once.  Stop Taking Reason: CV Entry Error       [facility] would like to thank you for allowing us  to assist you with your healthcare needs. The following includes patient education materials and information regarding your injury/illness.   Turner, Oscar S has been given the following list of follow-up instructions, prescriptions, and patient education materials:  Follow-up Instructions:             With: Address: When:   DUNN-MD, TED ALAN [address]  [place]  [address]  [phone] Within 1 to 2 weeks                It is important to always keep an active list of medications available so that you can share with other providers and manage your medications appropriately. As an additional courtesy, we are also providing you with your final active medications list that you can keep with you.           omega-3 polyunsaturated fatty acids (Fish Oil) 1,000 Milligram Oral (given by mouth) every day.  testosterone  (Testosterone  Cypionate 200 mg/mL intramuscular solution) 1 Milliliter Intramuscular (in a muscle) every other week.      Take only the medications listed above. Contact your doctor prior to taking any medications not on this list.  Discharge instructions, if any, will display below     Instructions for Diet: INSTRUCTIONS FOR DIET%>  Instructions for Supplements: SUPPLEMENT INSTRUCTIONS%>  Instructions for Activity: INSTRUCTIONS FOR ACTIVITY%>  Instructions for Wound Care: INSTRUCTIONS FOR WOUND CARE%>     Medication leaflets, if any, will display below     Patient education materials, if any, will display below        Gastritis (Adult)      Gastritis is inflammation and irritation of the stomach lining. It can be present for a short time (acute) or be long lasting (chronic). Gastritis is often caused by infection with bacteria called H pylori. More than a third of people in the [place]   have this bacteria in their bodies. In many cases, H pylori causes no problems or symptoms. In some people, though, the infection irritates the stomach lining and causes gastritis. Other causes of stomach irritation include drinking alcohol or taking pain-relieving medicines called NSAIDs (such as aspirin or ibuprofen ).    Symptoms of gastritis can include:    Abdominal pain or bloating    Loss of appetite    Nausea or vomiting    Vomiting blood or having black stools    Feeling more tired than usual   An inflamed and irritated stomach lining is more likely to develop a sore called an ulcer. To help prevent this, gastritis should be treated.   Home care   If needed, medicines may be prescribed. If you have H pylori infection, treating it will likely relieve your symptoms. Other changes can help reduce stomach irritation and help it heal.    If you have been prescribed medicines for H pylori infection, take them as directed. Take all of the medicine until it is finished or your healthcare provider tells you to stop, even if you feel better.    Your healthcare provider may recommend avoiding NSAIDs. If you take daily aspirin for your heart or other medical reasons, do not stop without talking to your healthcare provider first.    Avoid drinking alcohol.    Stop smoking. Smoking can irritate the stomach and delay healing. As much as possible, stay away from second hand smoke.   Follow-up care   Follow up with your healthcare provider, or as advised by our staff. Testing may be needed to check for inflammation or an ulcer.   When to seek medical advice   Call your healthcare provider for any of the following:    Stomach pain that gets worse or moves to the lower right abdomen (appendix area)    Chest pain that appears or gets worse, or spreads to the back, neck, shoulder, or arm    Frequent vomiting (cant keep down liquids)    Blood in the stool or vomit (red or black in color)    Feeling weak or dizzy     Fever of 100.54F (38C) or higher, or as directed by your healthcare provider      [phone] [employer], [facility]. [address] [place], [place] [address]. All rights reserved. This information is not intended as a substitute for professional medical care. Always follow your healthcare professional's instructions.         IS IT A STROKE? Act FAST and Check for these signs:    FACE                         Does the face look uneven?    ARM                         Does one  arm drift down?    SPEECH                    Does their speech sound strange?    TIME                         Call 9-1-1 at any sign of stroke  Heart Attack Signs  Chest discomfort: Most heart attacks involve discomfort in the center of the chest and lasts more than a few minutes, or goes away and comes back. It can feel like uncomfortable pressure, squeezing, fullness or pain.  Discomfort in upper body: Symptoms can include pain or discomfort in one or both arms, back, neck, jaw or stomach.  Shortness of breath: With or without discomfort.  Other signs: Breaking out in a cold sweat, nausea, or lightheaded.  Remember, MINUTES DO MATTER. If you experience any of these heart attack warning signs, call 9-1-1 to get immediate medical attention!     ---------------------------------------------------------------------------------------------------------------------  [facility] allows you to manage your health, view your test results, and retrieve your discharge documents from your hospital stay securely and conveniently from your computer.  To begin the enrollment process, visit [email or web address] Click on "Sign up now" under [facility].

## 2017-05-26 NOTE — Nursing Note (Signed)
Nursing Discharge Summary - Text       Nursing Discharge Summary Entered On:  05/26/2017 14:58 EDT    Performed On:  05/26/2017 14:56 EDT by Sheila Oats, RN, [facility]   Discharge To, Anticipated :   Home independently   Professional Skilled Services, Anticipated :   No Needs   Transportation :   Private vehicle   Accompanied By :   Joycie Peek of Discharge :   Wheelchair   MULLER, RN, GRETCHEN A - 05/26/2017 14:56 EDT   Education   Responsible Learner(s) :   Living Situation: Home independently        Performed by: Florentina Addison - 05/25/2017 18:11  Discharge To: Home with family support        Performed by: Joaquim Lai RN, Anderson Malta E - 05/25/2017 18:11     Home Caregiver Present for Session :   Yes   Teaching Method :   Explanation, Printed materials   Junie Panning - 05/26/2017 14:56 EDT   Post-Hospital Education Adult Grid   Activity Expectations :   Rosezena Sensor understanding   Diagnostic Results :   Rosezena Sensor understanding   Disease Process :   Verbalizes understanding   Equipment/Devices :   Verbalizes understanding   Pain Management :   Verbalizes understanding   Physical Limitations :   Verbalizes understanding   Plan of Care :   Verbalizes understanding   When to Call Health Care Provider :   [facility] understanding   Sheila Oats, RNHowell Pringle - 05/26/2017 14:56 EDT   Health Maintenance Education Adult Grid   Diet/Nutrition :   Verbalizes understanding   Exercise :   Verbalizes understanding   MULLER, RN, Howell Pringle - 05/26/2017 14:56 EDT   Medication Education Adult Grid   Med Dosage, Route, Scheduling :   Verbalizes understanding   MULLER, RN, GRETCHEN A - 05/26/2017 14:56 EDT   Additional Learner(s) Present :   Spouse   Time Spent Educating Patient :   10 minutes   MULLER, RN, GRETCHEN A - 05/26/2017 14:56 EDT

## 2017-05-26 NOTE — Discharge Summary (Signed)
 Inpatient Clinical Summary             [facility]  Post-Acute Care Transfer Instructions  PERSON INFORMATION   Name: Oscar Turner, Oscar Turner  MRN: [id number]    FIN#: [id number]   PHYSICIANS  Admitting Physician: RONNELL NORLEEN SHARPER  Attending Physician: RONNELL NORLEEN SHARPER   PCP: DUNN-MD,  TED ALAN  Discharge Diagnosis:  Chest pain  Comment:       PATIENT EDUCATION INFORMATION  Instructions:             GASTRITIS (Adult)  Medication Leaflets:               Follow-up:                          With: Address: When:   DUNN-MD, TED ALAN [address]  [place]  [address]  [phone] Within 1 to 2 weeks                           MEDICATION LIST  Medication Reconciliation at Discharge:         Medications that have not changed  Other Medications  omega-3 polyunsaturated fatty acids (Fish Oil) 1,000 Milligram Oral (given by mouth) every day.  Last Dose:____________________  testosterone  (Testosterone  Cypionate 200 mg/mL intramuscular solution) 1 Milliliter Intramuscular (in a muscle) every other week.  Last Dose:____________________  No Longer Take the Following Medications  aspirin (Aspirin Low Dose 81 mg oral delayed release tablet) 1 Tabs Oral (given by mouth) every day.  Stop Taking Reason: Physician Request  ibuprofen  (Advil  200 mg oral tablet) 2 Tabs Oral (given by mouth) every 4 hours as needed for pain.  Stop Taking Reason: Physician Request  multivitamin (Multiple Vitamins oral capsule) 1 Capsules Oral (given by mouth) every day.  Stop Taking Reason: Physician Request  ondansetron (ondansetron 4 mg oral tablet, disintegrating) 1 Tabs Oral (given by mouth) once.  Stop Taking Reason: CV Entry Error         Patient's Final Home Medication List Upon Discharge:          omega-3 polyunsaturated fatty acids (Fish Oil) 1,000 Milligram Oral (given by mouth) every day.  testosterone  (Testosterone  Cypionate 200 mg/mL intramuscular solution) 1 Milliliter Intramuscular (in a muscle)  every other week.         Comment:       ORDERS         Order Name Order Details   Discharge Patient 05/26/17 14:36:00 EDT, Discharge Home/Self Care

## 2017-05-31 DIAGNOSIS — H40003 Preglaucoma, unspecified, bilateral: Secondary | ICD-10-CM | POA: Diagnosis not present

## 2017-05-31 NOTE — Progress Notes (Signed)
Outpatient PT Certification Letter-Text       Outpatient PT Certification Letter Entered On:  05/31/2017 9:32 EDT    Performed On:  05/31/2017 9:31 EDT by Saralyn Pilar PT, [place]               Physician Certification   Date of Injury or Surgery :   05/11/2017 EDT   Ordering Physician Name :   Girard Cooter   Number of Visits This Interval :   1   Dear Physician :   Thank you for your referral. Below is the patient information for the stated interval of treatment. Please review, modify (if necessary), sign, and return. Thank you.   Date of Evaluation :   4/74/2595 EDT   PT Certification Interval End :   08/29/2017 EDT   Physician Signature Required :   Yes   Staff Physician Signature :   The physician's electronic signature noted above indicates approval of the documented Plan of Care for the stated interval.   PATRICK, PT, [place] - 05/31/2017 9:31 EDT   Problem List   (As Of: 05/31/2017 09:32:17 EDT)   Problems(Active)    Alteration in comfort: pain (SNOMED CT  :63875643 )  Name of Problem:   Alteration in comfort: pain ; Recorder:   SYSTEM,  SYSTEM; Confirmation:   Confirmed ; Classification:   Interdisciplinary ; Code:   32951884 ; Last Updated:   05/25/2017 17:18 EDT ; Life Cycle Date:   05/25/2017 ; Life Cycle Status:   Active ; Vocabulary:   SNOMED CT   ; Comments:        05/25/2017 17:18 - SYSTEM,  SYSTEM  Problem added automatically by system based on initiation of Alteration in Comfort Plan of Care      At risk for injury (SNOMED CT  :166063016 )  Name of Problem:   At risk for injury ; Recorder:   SYSTEM,  SYSTEM; Confirmation:   Confirmed ; Classification:   Interdisciplinary ; Code:   [id number] ; Last Updated:   05/25/2017 17:18 EDT ; Life Cycle Date:   05/25/2017 ; Life Cycle Status:   Active ; Vocabulary:   SNOMED CT   ; Comments:        05/25/2017 17:18 - SYSTEM,  SYSTEM  Problem added automatically by system based on initiation of Risk for Injury Plan of Care      Basal cell carcinoma (SNOMED CT  :732202542 )   Name of Problem:   Basal cell carcinoma ; Recorder:   DAVIS, RN, JOY M; Confirmation:   Confirmed ; Classification:   Patient Stated ; Code:   706237628 ; Contributor System:   Conservation officer, nature ; Last Updated:   05/05/2017 11:17 EDT ; Life Cycle Date:   05/05/2017 ; Life Cycle Status:   Active ; Vocabulary:   SNOMED CT        History of prostate cancer (SNOMED CT  :3151761607 )  Name of Problem:   History of prostate cancer ; Recorder:   West,  Sarah E; Confirmation:   Confirmed ; Classification:   Patient Stated ; Code:   3710626948 ; Contributor System:   Conservation officer, nature ; Last Updated:   04/07/2016 14:05 EDT ; Life Cycle Date:   04/07/2016 ; Life Cycle Status:   Active ; Vocabulary:   SNOMED CT        Shoulder pain (SNOMED CT  :54627035 )  Name of Problem:   Shoulder pain ; Recorder:   DAVIS, RN, JOY M; Confirmation:  Confirmed ; Classification:   Patient Stated ; Code:   56387564 ; Contributor System:   Conservation officer, nature ; Last Updated:   05/05/2017 11:16 EDT ; Life Cycle Date:   05/05/2017 ; Life Cycle Status:   Active ; Vocabulary:   SNOMED CT   ; Comments:        05/05/2017 11:16 - DAVIS, RN, JOY M  LEFT SHOULDER. DECREASED ROM      Torn rotator cuff (SNOMED CT  :3329518841 )  Name of Problem:   Torn rotator cuff ; Recorder:   DAVIS, RN, JOY M; Confirmation:   Confirmed ; Classification:   Patient Stated ; Code:   6606301601 ; Contributor System:   Conservation officer, nature ; Last Updated:   05/05/2017 11:17 EDT ; Life Cycle Date:   05/05/2017 ; Life Cycle Status:   Active ; Vocabulary:   SNOMED CT          Diagnoses(Active)    Pain in left shoulder  Date:   05/31/2017 ; Diagnosis Type:   Other ; Confirmation:   Differential ; Clinical Dx:   Pain in left shoulder ; Classification:   Interdisciplinary ; Clinical Service:   Non-Specified ; Code:   ICD-10-CM ; Probability:   0 ; Diagnosis Code:   U93.235        Plan   PT Frequency Rehab :   Other: 3x/week   PT Duration Rehab :   Other: 90 days   PT Anticipated Treatments, Needs :   Electrical  stimulation, Joint mobilization, Manual therapy, Posture/Body mechanics training, Patient education, Soft tissue massage/MFR, Therapeutic exercises, Vasopneunmatic cryocompression, Other: Dry Needling   PT Certification Letter Complete :   Yes   [place], PT, [place] - 05/31/2017 9:31 EDT   Outpatient Review   Rehab Potential Physical Therapy :   Christena Deem, PT, [place] - 05/31/2017 9:31 EDT   Prior Functional Level Grid   ADL :   Independent   Mobility :   Independent   Instrumental ADL :   Independent   Cognitive-Communication Skills :   Independent   PATRICK, PT, [place] - 05/31/2017 9:31 EDT   PT Impairments or Limitations :   Pain limiting function, Range of motion deficits, Strength deficits   *Clinical Assessment Summary :   Pt. is s/p L supraspinatus/infraspinatus and subscapulair repair on 05/11/17.  He has limited L shoulder ROM and strength as well as impaired functional use of L UE.  Pt. will benefit from PT intervention to restore optimal L shoulder use s pain.  Due to multi-tendon repair pt. is at risk for post-operative stiffness.     PATRICK, [place], [place] - 05/31/2017 9:31 EDT   Long Term Goals   Outpatient PT Long Term Goals Rehab     Long Term Goal 1          Goal :    Pt. will have full L shoulder PROM/AROM              Comments :    90 days                PATRICK, PT, [place] - 05/31/2017 9:31 EDT         Short Term Goals   Other PT Goals Grid     Goal #1  Goal #2  Goal #3      Goal :    Pt. will be Indep/compliant c HEP   Pt. will sleep s L shoulder pain   Pt. will no longer  need L shoulder sling        Comments :    30 days                PATRICK, PT, [place] - 05/31/2017 9:31 EDT  PATRICK, PT, [place] - 05/31/2017 9:31 EDT  PATRICK, PT, [place] - 05/31/2017 9:31 EDT       PT ST Goals Reviewed :   Elson Clan, PT, [place] - 05/31/2017 9:31 EDT    Signature Line                                                 Electronically Signed On 05/31/17 09:31 AM                                                _________________________________________________                                               Clint Bolder, KENT                                                     Electronically Signed On 06/01/17 05:21 PM                                               _________________________________________________                                                Girard Cooter                  [place] Date: 05/31/17 09:31 AM

## 2017-06-13 ENCOUNTER — Telehealth: Payer: Self-pay | Admitting: Internal Medicine

## 2017-06-13 NOTE — Telephone Encounter (Signed)
Patient would like to know if Dr. Jones would take him on as a patient.  °

## 2017-06-13 NOTE — Telephone Encounter (Signed)
yes

## 2017-06-14 NOTE — Telephone Encounter (Signed)
Left patient vm to call back to make appt  °

## 2017-06-26 NOTE — Progress Notes (Signed)
Outpatient PT Certification Letter-Text       Outpatient PT Certification Letter Entered On:  06/26/2017 9:32 EDT    Performed On:  06/26/2017 9:31 EDT by Saralyn Pilar PT, [place]               Physician Certification   Date of Injury or Surgery :   05/11/2017 EDT   Ordering Physician Name :   Girard Cooter   Number of Visits This Interval :   10   Dear Physician :   Thank you for your referral. Below is the patient information for the stated interval of treatment. Please review, modify (if necessary), sign, and return. Thank you.   Date of Evaluation :   5/95/6387 EDT   PT Certification Interval End :   08/29/2017 EDT   Physician Signature Required :   Yes   Staff Physician Signature :   The physician's electronic signature noted above indicates approval of the documented Plan of Care for the stated interval.   PATRICK, PT, [place] - 06/26/2017 9:31 EDT   Problem List   (As Of: 06/26/2017 09:32:03 EDT)   Problems(Active)    Alteration in comfort: pain (SNOMED CT  :56433295 )  Name of Problem:   Alteration in comfort: pain ; Recorder:   SYSTEM,  SYSTEM; Confirmation:   Confirmed ; Classification:   Interdisciplinary ; Code:   [id number] ; Last Updated:   05/25/2017 17:18 EDT ; Life Cycle Date:   05/25/2017 ; Life Cycle Status:   Active ; Vocabulary:   SNOMED CT   ; Comments:        05/25/2017 17:18 - SYSTEM,  SYSTEM  Problem added automatically by system based on initiation of Alteration in Comfort Plan of Care      At risk for injury (SNOMED CT  :630160109 )  Name of Problem:   At risk for injury ; Recorder:   SYSTEM,  SYSTEM; Confirmation:   Confirmed ; Classification:   Interdisciplinary ; Code:   [id number] ; Last Updated:   05/25/2017 17:18 EDT ; Life Cycle Date:   05/25/2017 ; Life Cycle Status:   Active ; Vocabulary:   SNOMED CT   ; Comments:        05/25/2017 17:18 - SYSTEM,  SYSTEM  Problem added automatically by system based on initiation of Risk for Injury Plan of Care      Basal cell carcinoma (SNOMED CT  :025427062 )   Name of Problem:   Basal cell carcinoma ; Recorder:   DAVIS, RN, JOY M; Confirmation:   Confirmed ; Classification:   Patient Stated ; Code:   [id number] ; Contributor System:   Conservation officer, nature ; Last Updated:   05/05/2017 11:17 EDT ; Life Cycle Date:   05/05/2017 ; Life Cycle Status:   Active ; Vocabulary:   SNOMED CT        History of prostate cancer (SNOMED CT  :7616073710 )  Name of Problem:   History of prostate cancer ; Recorder:   West,  Sarah E; Confirmation:   Confirmed ; Classification:   Patient Stated ; Code:   [id number] ; Contributor System:   Conservation officer, nature ; Last Updated:   04/07/2016 14:05 EDT ; Life Cycle Date:   04/07/2016 ; Life Cycle Status:   Active ; Vocabulary:   SNOMED CT        Shoulder pain (SNOMED CT  :70350093 )  Name of Problem:   Shoulder pain ; Recorder:   DAVIS, RN, JOY M; Confirmation:  Confirmed ; Classification:   Patient Stated ; Code:   29798921 ; Contributor System:   Conservation officer, nature ; Last Updated:   05/05/2017 11:16 EDT ; Life Cycle Date:   05/05/2017 ; Life Cycle Status:   Active ; Vocabulary:   SNOMED CT   ; Comments:        05/05/2017 11:16 - DAVIS, RN, JOY M  LEFT SHOULDER. DECREASED ROM      Torn rotator cuff (SNOMED CT  :1941740814 )  Name of Problem:   Torn rotator cuff ; Recorder:   DAVIS, RN, JOY M; Confirmation:   Confirmed ; Classification:   Patient Stated ; Code:   [id number] ; Contributor System:   Conservation officer, nature ; Last Updated:   05/05/2017 11:17 EDT ; Life Cycle Date:   05/05/2017 ; Life Cycle Status:   Active ; Vocabulary:   SNOMED CT          Diagnoses(Active)    Pain in left shoulder  Date:   05/31/2017 ; Diagnosis Type:   Other ; Confirmation:   Differential ; Clinical Dx:   Pain in left shoulder ; Classification:   Interdisciplinary ; Clinical Service:   Non-Specified ; Code:   ICD-10-CM ; Probability:   0 ; Diagnosis Code:   F02.637        Plan   PT Frequency Rehab :   Other: 3x/week   PT Duration Rehab :   8   PT Anticipated Treatments, Needs :   Electrical stimulation, Joint  mobilization, Manual therapy, Posture/Body mechanics training, Patient education, Soft tissue massage/MFR, Therapeutic exercises, Vasopneunmatic cryocompression, Other: Dry Needling   PT Certification Letter Complete :   Yes   PATRICK, PT, [place] - 06/26/2017 9:31 EDT   Outpatient Review   Rehab Potential Physical Therapy :   Christena Deem, PT, [place] - 06/26/2017 9:31 EDT   Prior Functional Level Grid   ADL :   Independent   Mobility :   Independent   Instrumental ADL :   Independent   Cognitive-Communication Skills :   Independent   PATRICK, PT, [place] - 06/26/2017 9:31 EDT   PT Impairments or Limitations :   Pain limiting function, Range of motion deficits, Strength deficits   *Clinical Assessment Summary :   Pt. is s/p L supraspinatus/infraspinatus and subscapulair repair on 05/11/17.  He has limited L shoulder ROM and strength as well as impaired functional use of L UE.  Pt. will benefit from PT intervention to restore optimal L shoulder use s pain.  Due to multi-tendon repair pt. is at risk for post-operative stiffness.     PATRICK, [place], [place] - 06/26/2017 9:31 EDT   Long Term Goals   Outpatient PT Long Term Goals Rehab     Long Term Goal 1          Goal :    Pt. will have full L shoulder PROM/AROM              Comments :    90 days                PATRICK, PT, [place] - 06/26/2017 9:31 EDT         Short Term Goals   Other PT Goals Grid     Goal #1  Goal #2  Goal #3      Goal :    Pt. will be Indep/compliant c HEP   Pt. will sleep s L shoulder pain   Pt. will no longer need L  shoulder sling        Status :    Goal met      Goal met        Date Met :    06/09/2017 EDT      July 11, 2017 EDT        Comments :    30 days                PATRICK, PT, [place] - 07-11-2017 9:31 EDT  PATRICK, PT, [place] - Jul 11, 2017 9:31 EDT  PATRICK, PT, [place] - Jul 11, 2017 9:31 EDT       PT ST Goals Reviewed :   Elson Clan, PT, [place] - 07-11-17 9:31 EDT   PT G-Codes and Modifiers   Primary Functional Limitation Current Status (ref) :   Carrying, moving and handling  objects   Carry Current Status (T-0626) :   CI At least 1% but less than 20% impaired   Primary Functional Limitation Goal Status (ref) :   Carrying, moving and handling objects   Carry Goal Status (R-4854) :   CI At least 1% but less than 20% impaired   PATRICK, PT, [place] - Jul 11, 2017 9:31 EDT    Signature Line                                                 Electronically Signed On 07-11-17 09:31 AM                                               _________________________________________________                                               Clint Bolder, K[place]                        Dicatation Date: July 11, 2017 09:31 AM

## 2017-07-05 ENCOUNTER — Ambulatory Visit (INDEPENDENT_AMBULATORY_CARE_PROVIDER_SITE_OTHER): Payer: Medicare Other | Admitting: Internal Medicine

## 2017-07-05 ENCOUNTER — Other Ambulatory Visit (INDEPENDENT_AMBULATORY_CARE_PROVIDER_SITE_OTHER): Payer: Medicare Other

## 2017-07-05 ENCOUNTER — Encounter: Payer: Self-pay | Admitting: Internal Medicine

## 2017-07-05 VITALS — BP 118/72 | HR 62 | Temp 98.0°F | Resp 16 | Ht 74.0 in | Wt 202.0 lb

## 2017-07-05 DIAGNOSIS — N529 Male erectile dysfunction, unspecified: Secondary | ICD-10-CM | POA: Diagnosis not present

## 2017-07-05 DIAGNOSIS — N400 Enlarged prostate without lower urinary tract symptoms: Secondary | ICD-10-CM

## 2017-07-05 DIAGNOSIS — Z0000 Encounter for general adult medical examination without abnormal findings: Secondary | ICD-10-CM | POA: Insufficient documentation

## 2017-07-05 DIAGNOSIS — R972 Elevated prostate specific antigen [PSA]: Secondary | ICD-10-CM

## 2017-07-05 DIAGNOSIS — E039 Hypothyroidism, unspecified: Secondary | ICD-10-CM

## 2017-07-05 DIAGNOSIS — E785 Hyperlipidemia, unspecified: Secondary | ICD-10-CM

## 2017-07-05 LAB — LIPID PANEL
CHOLESTEROL: 195 mg/dL (ref 0–200)
HDL: 76 mg/dL (ref >39.00)
LDL CALC: 109 mg/dL — AB (ref 0–99)
NonHDL: 119.43
Total CHOL/HDL Ratio: 3
Triglycerides: 52 mg/dL (ref 0.0–149.0)
VLDL: 10.4 mg/dL (ref 0.0–40.0)

## 2017-07-05 LAB — PSA: PSA: 3.35 ng/mL (ref 0.10–4.00)

## 2017-07-05 LAB — TSH: TSH: 1.97 u[IU]/mL (ref 0.35–4.50)

## 2017-07-05 NOTE — Patient Instructions (Signed)

## 2017-07-05 NOTE — Progress Notes (Signed)
Subjective:  Patient ID: Zachary Levy, male    DOB: 1946-08-05  Age: 71 y.o. MRN: [id number]  CC: Annual Exam and Hypothyroidism  NEW TO ME  HPI Zachary Levy presents for a CPX.  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 He has HIV infection that is treated at [facility]. He complains of ED but offers no other complaints today.  History Zachary Levy has a past medical history of HIV infection ([place]) and Hypothyroid.   He has no past surgical history on file.   His family history includes Cancer in his brother and sister.He reports that he has never smoked. He has never used smokeless tobacco. He reports that he drinks about 0.6 oz of alcohol per week . He reports that he does not use drugs.  Outpatient Medications Prior to Visit  Medication Sig Dispense Refill  . Emtricitab-Rilpivir-Tenofov AF (ODEFSEY PO) Take by mouth.    Marland Kitchen LEVOTHYROXINE SODIUM PO Take by mouth.    . raltegravir (ISENTRESS) 400 MG tablet Take 400 mg by mouth 2 (two) times daily.     No facility-administered medications prior to visit.     ROS Review of Systems  Constitutional: Negative.  Negative for activity change, appetite change, diaphoresis, fatigue and unexpected weight change.  HENT: Negative.  Negative for sinus pressure.   Eyes: Negative for visual disturbance.  Respiratory: Negative for cough, chest tightness, shortness of breath and wheezing.   Cardiovascular: Negative for chest pain, palpitations and leg swelling.  Gastrointestinal: Negative for abdominal pain, blood in stool, constipation, diarrhea, nausea and vomiting.  Endocrine: Negative.   Genitourinary: Negative.  Negative for decreased urine volume, difficulty urinating, dysuria, genital sores, penile swelling, scrotal swelling, testicular pain and urgency.       +ED  Musculoskeletal: Negative.  Negative for back pain and myalgias.  Skin: Negative.  Negative for color change and rash.  Allergic/Immunologic:  Negative.   Neurological: Negative.  Negative for dizziness.  Hematological: Negative.  Negative for adenopathy. Does not bruise/bleed easily.    Objective:  BP 118/72 (BP Location: Left Arm, Patient Position: Sitting, Cuff Size: Normal)   Pulse 62   Temp 98 F (36.7 C) (Oral)   Resp 16   Ht 6\' 2"  (1.88 m)   Wt 202 lb (91.6 kg)   SpO2 99%   BMI 25.94 kg/m   Physical Exam  Abdominal: Hernia confirmed negative in the right inguinal area and confirmed negative in the left inguinal area.  Genitourinary: Testes normal and penis normal. Rectal exam shows anal tone abnormal and guaiac positive stool. Rectal exam shows no external hemorrhoid, no internal hemorrhoid, no fissure, no mass and no tenderness. Prostate is enlarged (1+ smooth symm BPH). Prostate is not tender. Right testis shows no mass, no swelling and no tenderness. Right testis is descended. Left testis shows no mass, no swelling and no tenderness. Left testis is descended. Circumcised. No penile erythema or penile tenderness. No discharge found.  Lymphadenopathy:       Right: No inguinal adenopathy present.       Left: No inguinal adenopathy present.    Lab Results  Component Value Date   WBC 5.2 02/12/2007   HGB 14.3 02/12/2007   HCT 40.8 02/12/2007   PLT 169 02/12/2007   GLUCOSE 79 10/02/2006   CHOL 195 07/05/2017   TRIG 52.0 07/05/2017   HDL 76.00 07/05/2017   LDLCALC 109 (H) 07/05/2017   ALT 34 10/02/2006   AST 24 10/02/2006  NA 141 10/02/2006   K 4.0 10/02/2006   CL 107 10/02/2006   CREATININE 0.88 10/02/2006   BUN 14 10/02/2006   CO2 24 10/02/2006   TSH 1.97 07/05/2017   PSA 3.35 07/05/2017   INR 1.0 02/12/2007    Assessment & Plan:   [name] was seen today for annual exam and hypothyroidism.  Diagnoses and all orders for this visit:  Adult hypothyroidism- his TSH is in the normal range, will cont the current dose of T4 -     TSH; Future  Dyslipidemia, goal LDL below 130- he has a mildly elevated  risk, I have asked him to consider starting a statin and asa for CV risk reduction -     Lipid panel; Future  Routine general medical examination at a health care facility  Benign prostatic hyperplasia without lower urinary tract symptoms- he has a mildly elevated PSA but no sx's that need to be treated -     PSA; Future  ED (erectile dysfunction) of organic origin- will cont tri-mix as needed  PSA elevation- mild elevation, will recheck in about 3 months, I have asked him not to ejaculate for 1 week prior to rechecking the PSA   I am having [name] maintain his LEVOTHYROXINE SODIUM PO, raltegravir, and Emtricitab-Rilpivir-Tenofov AF (ODEFSEY PO).  No orders of the defined types were placed in this encounter.  See AVS for instructions about healthy living and anticipatory guidance.  Follow-up: Return in about 6 months (around 01/05/2018).  Scarlette Calico, MD

## 2017-07-07 ENCOUNTER — Encounter: Payer: Self-pay | Admitting: Internal Medicine

## 2017-07-07 DIAGNOSIS — R972 Elevated prostate specific antigen [PSA]: Secondary | ICD-10-CM | POA: Insufficient documentation

## 2017-07-13 DIAGNOSIS — R351 Nocturia: Secondary | ICD-10-CM | POA: Diagnosis not present

## 2017-07-13 DIAGNOSIS — Z6826 Body mass index (BMI) 26.0-26.9, adult: Secondary | ICD-10-CM | POA: Diagnosis not present

## 2017-07-13 DIAGNOSIS — N529 Male erectile dysfunction, unspecified: Secondary | ICD-10-CM | POA: Diagnosis not present

## 2017-07-13 DIAGNOSIS — E291 Testicular hypofunction: Secondary | ICD-10-CM | POA: Diagnosis not present

## 2017-07-17 ENCOUNTER — Telehealth: Payer: Self-pay | Admitting: Internal Medicine

## 2017-07-17 NOTE — Telephone Encounter (Signed)
Patient is requesting a call back.  States he just received Dr. Adah Salvage response on lab work.  States that he does not understand why labs need to be repeated b/c they looked in range to him.  Patient would also like to know if he needs to make a 3 month fu appt or just go to the lab in 3 months.

## 2017-07-18 NOTE — Telephone Encounter (Signed)
The PSA is the only lab to repeat. Confirmation of an elevated PSA is recommended. No appt. Just go to lab.

## 2017-07-19 NOTE — Telephone Encounter (Signed)
Can you please call patient back in regard due to the questions in regard to labs.

## 2017-07-19 NOTE — Telephone Encounter (Signed)
Pt stated that he does not feel he needs to go back to have labs redone at this time.

## 2017-08-30 NOTE — Progress Notes (Signed)
Outpatient PT Certification Letter-Text       PT Outpatient Certification Letter Entered On:  08/30/2017 10:49 EDT    Performed On:  08/30/2017 10:49 EDT by Saralyn Pilar PT, [place]               Physician Certification   Date of Injury or Surgery :   05/11/2017 EDT   Ordering Physician Name :   Girard Cooter   Number of Visits This Interval :   1   Dear Physician :   Thank you for your referral. Below is the patient information for the stated interval of treatment. Please review, modify (if necessary), sign, and return. Thank you.   Date of Evaluation :   09/81/1914 EDT   PT Certification Interval End :   11/28/2017 EST   Physician Signature Required :   Yes   Staff Physician Signature :   The physician's electronic signature noted above indicates approval of the documented Plan of Care for the stated interval.   PATRICK, PT, [place] - 08/30/2017 10:49 EDT   Problem List   (As Of: 08/30/2017 10:49:38 EDT)   Problems(Active)    Alteration in comfort: pain (SNOMED CT  :78295621 )  Name of Problem:   Alteration in comfort: pain ; Recorder:   SYSTEM,  SYSTEM; Confirmation:   Confirmed ; Classification:   Interdisciplinary ; Code:   30865784 ; Last Updated:   05/25/2017 17:18 EDT ; Life Cycle Date:   05/25/2017 ; Life Cycle Status:   Active ; Vocabulary:   SNOMED CT   ; Comments:        05/25/2017 17:18 - SYSTEM,  SYSTEM  Problem added automatically by system based on initiation of Alteration in Comfort Plan of Care      At risk for injury (SNOMED CT  :696295284 )  Name of Problem:   At risk for injury ; Recorder:   SYSTEM,  SYSTEM; Confirmation:   Confirmed ; Classification:   Interdisciplinary ; Code:   132440102 ; Last Updated:   05/25/2017 17:18 EDT ; Life Cycle Date:   05/25/2017 ; Life Cycle Status:   Active ; Vocabulary:   SNOMED CT   ; Comments:        05/25/2017 17:18 - SYSTEM,  SYSTEM  Problem added automatically by system based on initiation of Risk for Injury Plan of Care      Basal cell carcinoma (SNOMED  CT  :725366440 )  Name of Problem:   Basal cell carcinoma ; Recorder:   DAVIS, RN, JOY M; Confirmation:   Confirmed ; Classification:   Patient Stated ; Code:   347425956 ; Contributor System:   Conservation officer, nature ; Last Updated:   05/05/2017 11:17 EDT ; Life Cycle Date:   05/05/2017 ; Life Cycle Status:   Active ; Vocabulary:   SNOMED CT        History of prostate cancer (SNOMED CT  :3875643329 )  Name of Problem:   History of prostate cancer ; Recorder:   West,  Sarah E; Confirmation:   Confirmed ; Classification:   Patient Stated ; Code:   5188416606 ; Contributor System:   Conservation officer, nature ; Last Updated:   04/07/2016 14:05 EDT ; Life Cycle Date:   04/07/2016 ; Life Cycle Status:   Active ; Vocabulary:   SNOMED CT        Shoulder pain (SNOMED CT  :30160109 )  Name of Problem:   Shoulder pain ; Recorder:   DAVIS, RN, JOY M; Confirmation:  Confirmed ; Classification:   Patient Stated ; Code:   46270350 ; Contributor System:   Conservation officer, nature ; Last Updated:   05/05/2017 11:16 EDT ; Life Cycle Date:   05/05/2017 ; Life Cycle Status:   Active ; Vocabulary:   SNOMED CT   ; Comments:        05/05/2017 11:16 - DAVIS, RN, JOY M  LEFT SHOULDER. DECREASED ROM      Torn rotator cuff (SNOMED CT  :0938182993 )  Name of Problem:   Torn rotator cuff ; Recorder:   DAVIS, RN, JOY M; Confirmation:   Confirmed ; Classification:   Patient Stated ; Code:   7169678938 ; Contributor System:   Conservation officer, nature ; Last Updated:   05/05/2017 11:17 EDT ; Life Cycle Date:   05/05/2017 ; Life Cycle Status:   Active ; Vocabulary:   SNOMED CT          Diagnoses(Active)    Pain in left shoulder  Date:   08/30/2017 ; Diagnosis Type:   Other ; Confirmation:   Differential ; Clinical Dx:   Pain in left shoulder ; Classification:   Interdisciplinary ; Clinical Service:   Non-Specified ; Code:   ICD-10-CM ; Probability:   0 ; Diagnosis Code:   B01.751        Plan   PT Frequency Rehab :   Other: 2x/week   PT Duration Rehab :   Other: 90 days   PT Anticipated Treatments, Needs :    Joint mobilization, Manual therapy, Therapeutic exercises, Vasopneunmatic cryocompression   PT Certification Letter Complete :   Yes   [place], PT, [place] - 08/30/2017 10:49 EDT   Outpatient Review   Rehab Potential Physical Therapy :   Christena Deem, PT, [place] - 08/30/2017 10:49 EDT   Prior Functional Level Grid   ADL :   Independent   Mobility :   Independent   Instrumental ADL :   Independent   Cognitive-Communication Skills :   Independent   PATRICK, PT, [place] - 08/30/2017 10:49 EDT   PT Impairments or Limitations :   Pain limiting function, Range of motion deficits, Strength deficits   *Clinical Assessment Summary :   Pt. is > 3 months s/p 3 tendon RC repair.  He continues to have some slight limitations in L shoulder ROM and muscle strength which inhibits the functional use of his L arm.  He will continue to benefit from PT intervention.     PATRICK, [place], [place] - 08/30/2017 10:49 EDT   Long Term Goals   Outpatient PT Long Term Goals Rehab     Long Term Goal 1          Goal :    Pt. will make full return to all recreational activities              Comments :    90 days                PATRICK, PT, [place] - 08/30/2017 10:49 EDT         Short Term Goals   Other PT Goals Grid     Goal #1  Goal #2        Goal :    Pt. will be Indep/compliant c strengthening exercises   Full L shoulder AROM in all directions s pain           Comments :    30 days  PATRICK, PT, [place] - 08/30/2017 10:49 EDT  PATRICK, PT, [place] - 08/30/2017 10:49 EDT        PT ST Goals Reviewed :   Elson Clan, PT, [place] - 08/30/2017 10:49 EDT    Signature Line                                                 Electronically Signed On 08/30/17 10:49 AM                                               _________________________________________________                                               Clint Bolder, [place]                                                     Electronically Signed On 08/30/17 07:15 PM                                                _________________________________________________                                                Girard Cooter                  Dicatation Date: 08/30/17 10:49 AM

## 2017-09-05 DIAGNOSIS — Z23 Encounter for immunization: Secondary | ICD-10-CM | POA: Diagnosis not present

## 2017-12-07 DIAGNOSIS — B20 Human immunodeficiency virus [HIV] disease: Secondary | ICD-10-CM | POA: Diagnosis not present

## 2017-12-07 DIAGNOSIS — E785 Hyperlipidemia, unspecified: Secondary | ICD-10-CM | POA: Diagnosis not present

## 2017-12-07 DIAGNOSIS — E039 Hypothyroidism, unspecified: Secondary | ICD-10-CM | POA: Diagnosis not present

## 2017-12-14 DIAGNOSIS — G4700 Insomnia, unspecified: Secondary | ICD-10-CM | POA: Diagnosis not present

## 2017-12-14 DIAGNOSIS — Z6826 Body mass index (BMI) 26.0-26.9, adult: Secondary | ICD-10-CM | POA: Diagnosis not present

## 2017-12-14 DIAGNOSIS — E039 Hypothyroidism, unspecified: Secondary | ICD-10-CM | POA: Diagnosis not present

## 2017-12-19 DIAGNOSIS — B20 Human immunodeficiency virus [HIV] disease: Secondary | ICD-10-CM | POA: Diagnosis not present

## 2017-12-19 DIAGNOSIS — Z23 Encounter for immunization: Secondary | ICD-10-CM | POA: Diagnosis not present

## 2018-05-11 ENCOUNTER — Ambulatory Visit (INDEPENDENT_AMBULATORY_CARE_PROVIDER_SITE_OTHER): Payer: Medicare Other | Admitting: Family Medicine

## 2018-05-11 ENCOUNTER — Encounter: Payer: Self-pay | Admitting: Family Medicine

## 2018-05-11 VITALS — BP 111/79 | Ht 74.0 in | Wt 195.0 lb

## 2018-05-11 DIAGNOSIS — M25561 Pain in right knee: Secondary | ICD-10-CM

## 2018-05-11 MED ORDER — MELOXICAM 15 MG PO TABS: 15.0000 mg | ORAL_TABLET | Freq: Every day | ORAL | 1 refills | Status: DC

## 2018-05-11 NOTE — Progress Notes (Signed)
Chief complaint: Right knee pain x3 weeks  History of present illness: Zachary Levy is a 72 year old male presents to sports medicine office today with chief complaint of right knee pain.  He reports that symptoms have been present for approximately 3 weeks now.  He points more diffusely in his right knee, feeling pain deep inside the right knee specifically when he does deep bending, lunging, and squatting.  He reports that he recently took up high intensity gym training and has been doing activities that does involve deep bending, lunging, squatting.  He reports that he was specifically doing a [place] when he was standing up from a squatted position when he started to feel pain deep inside his right knee.  He describes the pain as a throbbing, aching, and nonradiating pain.  He does not report of any swelling, warmth, erythema, or ecchymosis.  He does not report of any painful popping, locking, catching, or symptoms of giving way.  He reports since the last few weeks sometimes have been unchanged.  He reports that he has not done any gym exercises over the last week or 2 because he is worried that he would make symptoms worse.  He has not used any medicines, icing, or bracing.  He was here back in 2014 for right knee pain.  X-rays back at that time showed minimal patellofemoral osteoarthritic changes.  He did have ultrasound which did show extrusion of the medial meniscus with fragmentation of the lateral meniscus.  He does have history of right tibial plateau fracture back in 2011.  Review of systems:  As stated above  His past medical history, surgical history, family history, and social history obtained and reviewed.  His past medical history is notable for HIV, with near undetectable viral load, hyperlipidemia, BPH, and hypothyroidism; he does not report of any surgeries/operations; he does not report of any current tobacco use; family history notable for cancer; allergies and medications have been reviewed  and are reflected in EMR   Physical exam: Vital signs are reviewed and are documented in the chart Gen.: Alert, oriented, appears stated age, in no apparent distress HEENT: Moist oral mucosa Respiratory: Normal respirations, able to speak in full sentences Cardiac: Regular rate, distal pulses 2+ Integumentary: No rashes on visible skin:  Neurologic: He does have intact strength with quadriceps, hamstring, hip flexion, hip abduction, knee flexion, and knee extension on both sides, would categorize as strength 5/5, sensation 2+ in bilateral lower extremities Psych: Normal affect, mood is described as good Musculoskeletal: Inspection of his right knee reveals no obvious deformity or muscle atrophy, no warmth, erythema, ecchymosis, or effusion, he is tender to palpation in the medial parapatellar region to deep palpation, no tenderness of the distal quadriceps tendon, patellar tendon, medial joint line, lateral joint line, patellar compression testing negative, patellar apprehension negative, no signs of ligamentous instability as Lachman, anterior drawer, valgus, and varus stress testing negative, McMurray negative, range of motion today is from 0 degrees to 145 degrees on both knees, do feel crepitation long arc of range of motion, no antalgic gait with ambulation  Limited musculoskeletal ultrasound was performed in the office today of his right knee: -Unremarkable appearing quadriceps tendon, without any evidence of suprapatellar effusion - He does have extrusion of medial meniscus measuring 0.46 cm with para meniscal cyst more posteriorly in the joint line -He does have fragmentation and slight extrusion of lateral meniscus -Unremarkable appearing patellar tendon  Impression: Degenerative medial and lateral meniscal tearing, with extrusion of medial meniscus  and parameniscal cyst  Ultrasound performed and interpreted by: Mort Sawyers, MD and [name], MD  Assessment and plan: 1.  Right  knee pain x3 weeks, suspect secondary to aggravation of pre-existing patellofemoral osteoarthritis, with also ultrasound evidence of degenerative medial and lateral meniscal tearing  Plan: Discussed with Zachary Levy today that symptoms seem to be related to aggravation of pre-existing osteoarthritis of the right knee.  Discussed based on ultrasound he does have degenerative changes noted with degenerative tearing of both the medial and lateral meniscus.  Discussed different treatment plans and options with Zachary Levy today.  Pain is not excruciating to him today.  Will opt for conservative management to include starting him on an anti-inflammatory medication.  Will start on meloxicam 15 mg daily, discussed not to use any other ibuprofen, motrin, or Aleve while on this. He will take it daily for 2 weeks, then daily as needed thereafterwards.  In addition, will have him fitted with body helix compressive sleeve.  He will also start with quadriceps, hamstring, hip abductor exercises, as well as ensuring he will stretch the hamstrings.  Discussed avoiding aggravating activities to include deep bending, lunging, and squatting.  Otherwise, discussed that it is okay for him to continue with gym activities.  He will follow-up in 4 weeks for reevaluation.  If he continues to have pain, consideration for cortisone injection would be given at that time.  Otherwise, he will follow-up sooner as needed.   Mort Sawyers, M.D. [facility]

## 2018-05-14 NOTE — Progress Notes (Signed)
[facility]: Attending Note: I have reviewed the chart, discussed wit the Sports Medicine Fellow. I agree with assessment and treatment plan as detailed in the [place] note. I discussed with him activities that would aggravate skeletal issues.  Specifically I would avoid deep squats with weight, box jumps, anything with deep knee flexion resistance.  We discussed this for quite a while.  We discussed other options such as corticosteroid injection etc.  I think if he will be careful and limit his activities to 90% of the activities he wants to do, then he will be able to perform  those activities.  If he continues to push, and  wants to do all activities, then he may end up not being able to do very many at all.

## 2018-06-05 DIAGNOSIS — N5201 Erectile dysfunction due to arterial insufficiency: Secondary | ICD-10-CM | POA: Diagnosis not present

## 2018-06-06 DIAGNOSIS — E039 Hypothyroidism, unspecified: Secondary | ICD-10-CM | POA: Diagnosis not present

## 2018-06-06 DIAGNOSIS — B20 Human immunodeficiency virus [HIV] disease: Secondary | ICD-10-CM | POA: Diagnosis not present

## 2018-06-11 DIAGNOSIS — H1013 Acute atopic conjunctivitis, bilateral: Secondary | ICD-10-CM | POA: Diagnosis not present

## 2018-06-15 DIAGNOSIS — Z6826 Body mass index (BMI) 26.0-26.9, adult: Secondary | ICD-10-CM | POA: Diagnosis not present

## 2018-06-15 DIAGNOSIS — B20 Human immunodeficiency virus [HIV] disease: Secondary | ICD-10-CM | POA: Diagnosis not present

## 2018-08-13 ENCOUNTER — Ambulatory Visit (INDEPENDENT_AMBULATORY_CARE_PROVIDER_SITE_OTHER): Payer: Medicare Other | Admitting: *Deleted

## 2018-08-13 ENCOUNTER — Other Ambulatory Visit: Payer: Self-pay | Admitting: Internal Medicine

## 2018-08-13 ENCOUNTER — Other Ambulatory Visit: Payer: Medicare Other

## 2018-08-13 VITALS — BP 121/74 | HR 67 | Resp 16 | Ht 74.0 in | Wt 202.0 lb

## 2018-08-13 DIAGNOSIS — Z0000 Encounter for general adult medical examination without abnormal findings: Secondary | ICD-10-CM | POA: Diagnosis not present

## 2018-08-13 DIAGNOSIS — N400 Enlarged prostate without lower urinary tract symptoms: Secondary | ICD-10-CM

## 2018-08-13 DIAGNOSIS — Z23 Encounter for immunization: Secondary | ICD-10-CM | POA: Diagnosis not present

## 2018-08-13 NOTE — Patient Instructions (Addendum)
Continue doing brain stimulating activities (puzzles, reading, adult coloring books, staying active) to keep memory sharp.   Continue to eat heart healthy diet (full of fruits, vegetables, whole grains, lean protein, water--limit salt, fat, and sugar intake) and increase physical activity as tolerated.   Mr. Zachary Levy , Thank you for taking time to come for your Medicare Wellness Visit. I appreciate your ongoing commitment to your health goals. Please review the following plan we discussed and let me know if I can assist you in the future.   These are the goals we discussed: Goals    . Patient Stated     I want get my knee as strong as possible. Stay as healthy and as independent as possible by continuing to exercise, eat healthy, travel, enjoy life.       This is a list of the screening recommended for you and due dates:  Health Maintenance  Topic Date Due  . Flu Shot  06/21/2018  . Colon Cancer Screening  12/16/2024  . Tetanus Vaccine  05/19/2027  .  Hepatitis C: One time screening is recommended by Center for Disease Control  (CDC) for  adults born from [birth year] through 1963.   Completed  . Pneumonia vaccines  Completed   Influenza Virus Vaccine injection What is this medicine? INFLUENZA VIRUS VACCINE (in floo EN zuh VAHY ruhs vak SEEN) helps to reduce the risk of getting influenza also known as the flu. The vaccine only helps protect you against some strains of the flu. This medicine may be used for other purposes; ask your health care provider or pharmacist if you have questions. COMMON BRAND NAME(S): Afluria, Agriflu, Alfuria, FLUAD, Fluarix, Fluarix Quadrivalent, Flublok, Flublok Quadrivalent, FLUCELVAX, Flulaval, Fluvirin, Fluzone, Fluzone High-Dose, Fluzone Intradermal What should I tell my health care provider before I take this medicine? They need to know if you have any of these conditions: -bleeding disorder like hemophilia -fever or infection -Guillain-Barre syndrome or other  neurological problems -immune system problems -infection with the human immunodeficiency virus (HIV) or AIDS -low blood platelet counts -multiple sclerosis -an unusual or allergic reaction to influenza virus vaccine, latex, other medicines, foods, dyes, or preservatives. Different brands of vaccines contain different allergens. Some may contain latex or eggs. Talk to your doctor about your allergies to make sure that you get the right vaccine. -pregnant or trying to get pregnant -breast-feeding How should I use this medicine? This vaccine is for injection into a muscle or under the skin. It is given by a health care professional. A copy of Vaccine Information Statements will be given before each vaccination. Read this sheet carefully each time. The sheet may change frequently. Talk to your healthcare provider to see which vaccines are right for you. Some vaccines should not be used in all age groups. Overdosage: If you think you have taken too much of this medicine contact a poison control center or emergency room at once. NOTE: This medicine is only for you. Do not share this medicine with others. What if I miss a dose? This does not apply. What may interact with this medicine? -chemotherapy or radiation therapy -medicines that lower your immune system like etanercept, anakinra, infliximab, and adalimumab -medicines that treat or prevent blood clots like warfarin -phenytoin -steroid medicines like prednisone or cortisone -theophylline -vaccines This list may not describe all possible interactions. Give your health care provider a list of all the medicines, herbs, non-prescription drugs, or dietary supplements you use. Also tell them if you smoke, drink alcohol,  or use illegal drugs. Some items may interact with your medicine. What should I watch for while using this medicine? Report any side effects that do not go away within 3 days to your doctor or health care professional. Call your  health care provider if any unusual symptoms occur within 6 weeks of receiving this vaccine. You may still catch the flu, but the illness is not usually as bad. You cannot get the flu from the vaccine. The vaccine will not protect against colds or other illnesses that may cause fever. The vaccine is needed every year. What side effects may I notice from receiving this medicine? Side effects that you should report to your doctor or health care professional as soon as possible: -allergic reactions like skin rash, itching or hives, swelling of the face, lips, or tongue Side effects that usually do not require medical attention (report to your doctor or health care professional if they continue or are bothersome): -fever -headache -muscle aches and pains -pain, tenderness, redness, or swelling at the injection site -tiredness This list may not describe all possible side effects. Call your doctor for medical advice about side effects. You may report side effects to FDA at 1-800-FDA-1088. Where should I keep my medicine? The vaccine will be given by a health care professional in a clinic, pharmacy, doctor's office, or other health care setting. You will not be given vaccine doses to store at home. NOTE: This sheet is a summary. It may not cover all possible information. If you have questions about this medicine, talk to your doctor, pharmacist, or health care provider.  2018 Elsevier/Gold Standard (2015-05-29 10:07:28)   Health Maintenance, Male A healthy lifestyle and preventive care is important for your health and wellness. Ask your health care provider about what schedule of regular examinations is right for you. What should I know about weight and diet? Eat a Healthy Diet  Eat plenty of vegetables, fruits, whole grains, low-fat dairy products, and lean protein.  Do not eat a lot of foods high in solid fats, added sugars, or salt.  Maintain a Healthy Weight Regular exercise can help you  achieve or maintain a healthy weight. You should:  Do at least 150 minutes of exercise each week. The exercise should increase your heart rate and make you sweat (moderate-intensity exercise).  Do strength-training exercises at least twice a week.  Watch Your Levels of Cholesterol and Blood Lipids  Have your blood tested for lipids and cholesterol every 5 years starting at 70 years of age. If you are at high risk for heart disease, you should start having your blood tested when you are 70 years old. You may need to have your cholesterol levels checked more often if: ? Your lipid or cholesterol levels are high. ? You are older than 70 years of age. ? You are at high risk for heart disease.  What should I know about cancer screening? Many types of cancers can be detected early and may often be prevented. Lung Cancer  You should be screened every year for lung cancer if: ? You are a current smoker who has smoked for at least 30 years. ? You are a former smoker who has quit within the past 15 years.  Talk to your health care provider about your screening options, when you should start screening, and how often you should be screened.  Colorectal Cancer  Routine colorectal cancer screening usually begins at 70 years of age and should be repeated every 5-10 years  until you are 70 years old. You may need to be screened more often if early forms of precancerous polyps or small growths are found. Your health care provider may recommend screening at an earlier age if you have risk factors for colon cancer.  Your health care provider may recommend using home test kits to check for hidden blood in the stool.  A small camera at the end of a tube can be used to examine your colon (sigmoidoscopy or colonoscopy). This checks for the earliest forms of colorectal cancer.  Prostate and Testicular Cancer  Depending on your age and overall health, your health care provider may do certain tests to screen  for prostate and testicular cancer.  Talk to your health care provider about any symptoms or concerns you have about testicular or prostate cancer.  Skin Cancer  Check your skin from head to toe regularly.  Tell your health care provider about any new moles or changes in moles, especially if: ? There is a change in a mole's size, shape, or color. ? You have a mole that is larger than a pencil eraser.  Always use sunscreen. Apply sunscreen liberally and repeat throughout the day.  Protect yourself by wearing long sleeves, pants, a wide-brimmed hat, and sunglasses when outside.  What should I know about heart disease, diabetes, and high blood pressure?  If you are 19-88 years of age, have your blood pressure checked every 3-5 years. If you are 46 years of age or older, have your blood pressure checked every year. You should have your blood pressure measured twice-once when you are at a hospital or clinic, and once when you are not at a hospital or clinic. Record the average of the two measurements. To check your blood pressure when you are not at a hospital or clinic, you can use: ? An automated blood pressure machine at a pharmacy. ? A home blood pressure monitor.  Talk to your health care provider about your target blood pressure.  If you are between 57-43 years old, ask your health care provider if you should take aspirin to prevent heart disease.  Have regular diabetes screenings by checking your fasting blood sugar level. ? If you are at a normal weight and have a low risk for diabetes, have this test once every three years after the age of 48. ? If you are overweight and have a high risk for diabetes, consider being tested at a younger age or more often.  A one-time screening for abdominal aortic aneurysm (AAA) by ultrasound is recommended for men aged 39-75 years who are current or former smokers. What should I know about preventing infection? Hepatitis B If you have a higher  risk for hepatitis B, you should be screened for this virus. Talk with your health care provider to find out if you are at risk for hepatitis B infection. Hepatitis C Blood testing is recommended for:  Everyone born from [birth year] through 1963.  Anyone with known risk factors for hepatitis C.  Sexually Transmitted Diseases (STDs)  You should be screened each year for STDs including gonorrhea and chlamydia if: ? You are sexually active and are younger than 70 years of age. ? You are older than 70 years of age and your health care provider tells you that you are at risk for this type of infection. ? Your sexual activity has changed since you were last screened and you are at an increased risk for chlamydia or gonorrhea. Ask your health care  provider if you are at risk.  Talk with your health care provider about whether you are at high risk of being infected with HIV. Your health care provider may recommend a prescription medicine to help prevent HIV infection.  What else can I do?  Schedule regular health, dental, and eye exams.  Stay current with your vaccines (immunizations).  Do not use any tobacco products, such as cigarettes, chewing tobacco, and e-cigarettes. If you need help quitting, ask your health care provider.  Limit alcohol intake to no more than 2 drinks per day. One drink equals 12 ounces of beer, 5 ounces of [place], or 1 ounces of hard liquor.  Do not use street drugs.  Do not share needles.  Ask your health care provider for help if you need support or information about quitting drugs.  Tell your health care provider if you often feel depressed.  Tell your health care provider if you have ever been abused or do not feel safe at home. This information is not intended to replace advice given to you by your health care provider. Make sure you discuss any questions you have with your health care provider. Document Released: 05/05/2008 Document Revised: 07/06/2016 Document  Reviewed: 08/11/2015 Elsevier Interactive Patient Education  2018 [employer].  Knee Exercises Ask your health care provider which exercises are safe for you. Do exercises exactly as told by your health care provider and adjust them as directed. It is normal to feel mild stretching, pulling, tightness, or discomfort as you do these exercises, but you should stop right away if you feel sudden pain or your pain gets worse.Do not begin these exercises until told by your health care provider. STRETCHING AND RANGE OF MOTION EXERCISES These exercises warm up your muscles and joints and improve the movement and flexibility of your knee. These exercises also help to relieve pain, numbness, and tingling. Exercise A: Knee Extension, Prone 1. Lie on your abdomen on a bed. 2. Place your left / right knee just beyond the edge of the surface so your knee is not on the bed. You can put a towel under your left / right thigh just above your knee for comfort. 3. Relax your leg muscles and allow gravity to straighten your knee. You should feel a stretch behind your left / right knee. 4. Hold this position for __________ seconds. 5. Scoot up so your knee is supported between repetitions. Repeat __________ times. Complete this stretch __________ times a day. Exercise B: Knee Flexion, Active  1. Lie on your back with both knees straight. If this causes back discomfort, bend your left / right knee so your foot is flat on the floor. 2. Slowly slide your left / right heel back toward your buttocks until you feel a gentle stretch in the front of your knee or thigh. 3. Hold this position for __________ seconds. 4. Slowly slide your left / right heel back to the starting position. Repeat __________ times. Complete this exercise __________ times a day. Exercise C: Quadriceps, Prone  1. Lie on your abdomen on a firm surface, such as a bed or padded floor. 2. Bend your left / right knee and hold your ankle. If you  cannot reach your ankle or pant leg, loop a belt around your foot and grab the belt instead. 3. Gently pull your heel toward your buttocks. Your knee should not slide out to the side. You should feel a stretch in the front of your thigh and knee. 4. Hold  this position for __________ seconds. Repeat __________ times. Complete this stretch __________ times a day. Exercise D: Hamstring, Supine 1. Lie on your back. 2. Loop a belt or towel over the ball of your left / right foot. The ball of your foot is on the walking surface, right under your toes. 3. Straighten your left / right knee and slowly pull on the belt to raise your leg until you feel a gentle stretch behind your knee. ? Do not let your left / right knee bend while you do this. ? Keep your other leg flat on the floor. 4. Hold this position for __________ seconds. Repeat __________ times. Complete this stretch __________ times a day. STRENGTHENING EXERCISES These exercises build strength and endurance in your knee. Endurance is the ability to use your muscles for a long time, even after they get tired. Exercise E: Quadriceps, Isometric  1. Lie on your back with your left / right leg extended and your other knee bent. Put a rolled towel or small pillow under your knee if told by your health care provider. 2. Slowly tense the muscles in the front of your left / right thigh. You should see your kneecap slide up toward your hip or see increased dimpling just above the knee. This motion will push the back of the knee toward the floor. 3. For __________ seconds, keep the muscle as tight as you can without increasing your pain. 4. Relax the muscles slowly and completely. Repeat __________ times. Complete this exercise __________ times a day. Exercise F: Straight Leg Raises - Quadriceps 1. Lie on your back with your left / right leg extended and your other knee bent. 2. Tense the muscles in the front of your left / right thigh. You should see  your kneecap slide up or see increased dimpling just above the knee. Your thigh may even shake a bit. 3. Keep these muscles tight as you raise your leg 4-6 inches (10-15 cm) off the floor. Do not let your knee bend. 4. Hold this position for __________ seconds. 5. Keep these muscles tense as you lower your leg. 6. Relax your muscles slowly and completely after each repetition. Repeat __________ times. Complete this exercise __________ times a day. Exercise G: Hamstring, Isometric 1. Lie on your back on a firm surface. 2. Bend your left / right knee approximately __________ degrees. 3. Dig your left / right heel into the surface as if you are trying to pull it toward your buttocks. Tighten the muscles in the back of your thighs to dig as hard as you can without increasing any pain. 4. Hold this position for __________ seconds. 5. Release the tension gradually and allow your muscles to relax completely for __________ seconds after each repetition. Repeat __________ times. Complete this exercise __________ times a day. Exercise H: Hamstring Curls  If told by your health care provider, do this exercise while wearing ankle weights. Begin with __________ weights. Then increase the weight by 1 lb (0.5 kg) increments. Do not wear ankle weights that are more than __________. 1. Lie on your abdomen with your legs straight. 2. Bend your left / right knee as far as you can without feeling pain. Keep your hips flat against the floor. 3. Hold this position for __________ seconds. 4. Slowly lower your leg to the starting position.  Repeat __________ times. Complete this exercise __________ times a day. Exercise I: Squats (Quadriceps) 1. Stand in front of a table, with your feet and knees pointing  straight ahead. You may rest your hands on the table for balance but not for support. 2. Slowly bend your knees and lower your hips like you are going to sit in a chair. ? Keep your weight over your heels, not  over your toes. ? Keep your lower legs upright so they are parallel with the table legs. ? Do not let your hips go lower than your knees. ? Do not bend lower than told by your health care provider. ? If your knee pain increases, do not bend as low. 3. Hold the squat position for __________ seconds. 4. Slowly push with your legs to return to standing. Do not use your hands to pull yourself to standing. Repeat __________ times. Complete this exercise __________ times a day. Exercise J: Wall Slides (Quadriceps)  1. Lean your back against a smooth wall or door while you walk your feet out 18-24 inches (46-61 cm) from it. 2. Place your feet hip-width apart. 3. Slowly slide down the wall or door until your knees bend __________ degrees. Keep your knees over your heels, not over your toes. Keep your knees in line with your hips. 4. Hold for __________ seconds. Repeat __________ times. Complete this exercise __________ times a day. Exercise K: Straight Leg Raises - Hip Abductors 1. Lie on your side with your left / right leg in the top position. Lie so your head, shoulder, knee, and hip line up. You may bend your bottom knee to help you keep your balance. 2. Roll your hips slightly forward so your hips are stacked directly over each other and your left / right knee is facing forward. 3. Leading with your heel, lift your top leg 4-6 inches (10-15 cm). You should feel the muscles in your outer hip lifting. ? Do not let your foot drift forward. ? Do not let your knee roll toward the ceiling. 4. Hold this position for __________ seconds. 5. Slowly return your leg to the starting position. 6. Let your muscles relax completely after each repetition. Repeat __________ times. Complete this exercise __________ times a day. Exercise L: Straight Leg Raises - Hip Extensors 1. Lie on your abdomen on a firm surface. You can put a pillow under your hips if that is more comfortable. 2. Tense the muscles in your  buttocks and lift your left / right leg about 4-6 inches (10-15 cm). Keep your knee straight as you lift your leg. 3. Hold this position for __________ seconds. 4. Slowly lower your leg to the starting position. 5. Let your leg relax completely after each repetition. Repeat __________ times. Complete this exercise __________ times a day. This information is not intended to replace advice given to you by your health care provider. Make sure you discuss any questions you have with your health care provider. Document Released: 09/21/2005 Document Revised: 08/01/2016 Document Reviewed: 09/13/2015 Elsevier Interactive Patient Education  2018 [employer].

## 2018-08-13 NOTE — Progress Notes (Addendum)
Subjective:   Zachary Levy is a 72 y.o. male who presents for Medicare Annual/Subsequent preventive examination.  Review of Systems:  No ROS.  Medicare Wellness Visit. Additional risk factors are reflected in the social history.  Cardiac Risk Factors include: advanced age (>64mn, >>36women);male gender;dyslipidemia Sleep patterns: feels rested on waking, does not get up to void, gets up 1 times nightly to void and sleeps 6-7 hours nightly.    Home Safety/Smoke Alarms: Feels safe in home. Smoke alarms in place.  Living environment; residence and Firearm Safety: 2-story house, no firearms. Lives alone, no needs for DME, good support system Seat Belt Safety/Bike Helmet: Wears seat belt.   PSA-  Lab Results  Component Value Date   PSA 3.35 07/05/2017       Objective:    Vitals: BP 121/74   Pulse 67   Resp 16   Ht '6\' 2"'  (1.88 m)   Wt 202 lb (91.6 kg)   SpO2 98%   BMI 25.94 kg/m   Body mass index is 25.94 kg/m.  Advanced Directives 08/13/2018 07/07/2017  Does Patient Have a Medical Advance Directive? Yes Yes  Type of AParamedicof [place]akesideLiving will [place] will  Copy of HRegino Ramirezin Chart? No - copy requested No - copy requested    Tobacco Social History   Tobacco Use  Smoking Status Never Smoker  Smokeless Tobacco Never Used     Counseling given: Not Answered  Past Medical History:  Diagnosis Date  . HIV infection ([place]   . Hypothyroid    History reviewed. No pertinent surgical history. Family History  Problem Relation Age of Onset  . Cancer Sister   . Cancer Brother    Social History   Socioeconomic History  . Marital status: Divorced    Spouse name: Not on file  . Number of children: Not on file  . Years of education: Not on file  . Highest education level: Not on file  Occupational History  . Not on file  Social Needs  . Financial resource strain: Not hard at all  . Food  insecurity:    Worry: Never true    Inability: Never true  . Transportation needs:    Medical: No    Non-medical: No  Tobacco Use  . Smoking status: Never Smoker  . Smokeless tobacco: Never Used  Substance and Sexual Activity  . Alcohol use: Yes    Alcohol/week: 1.0 standard drinks    Types: 1 Cans of beer per week    Comment: 1-2 glass wine few times weekly  . Drug use: No  . Sexual activity: Not Currently    Partners: Male  Lifestyle  . Physical activity:    Days per week: 6 days    Minutes per session: 60 min  . Stress: Not at all  Relationships  . Social connections:    Talks on phone: More than three times a week    Gets together: More than three times a week    Attends religious service: 1 to 4 times per year    Active member of club or organization: Yes    Attends meetings of clubs or organizations: More than 4 times per year    Relationship status: Not on file  Other Topics Concern  . Not on file  Social History Narrative   Lives with his husband    Outpatient Encounter Medications as of 08/13/2018  Medication Sig  . Emtricitab-Rilpivir-Tenofov AF (  ODEFSEY PO) Take by mouth.  Marland Kitchen LEVOTHYROXINE SODIUM PO Take by mouth.  . meloxicam (MOBIC) 15 MG tablet Take 1 tablet (15 mg total) by mouth daily.  . raltegravir (ISENTRESS) 400 MG tablet Take 400 mg by mouth 2 (two) times daily.   No facility-administered encounter medications on file as of 08/13/2018.     Activities of Daily Living In your present state of health, do you have any difficulty performing the following activities: 08/13/2018  Hearing? N  Vision? N  Difficulty concentrating or making decisions? N  Walking or climbing stairs? N  Dressing or bathing? N  Doing errands, shopping? N  Preparing Food and eating ? N  Using the Toilet? N  In the past six months, have you accidently leaked urine? N  Do you have problems with loss of bowel control? N  Managing your Medications? N  Managing your Finances? N   Housekeeping or managing your Housekeeping? N  Some recent data might be hidden    Patient Care Team: Janith Lima, MD as PCP - General (Internal Medicine)   Assessment:   This is a routine wellness examination for Zachary Levy. Physical assessment deferred to PCP.   Exercise Activities and Dietary recommendations Current Exercise Habits: Home exercise routine;Structured exercise class, Type of exercise: walking;strength training/weights;calisthenics;stretching(tai chi), Time (Minutes): 60, Frequency (Times/Week): 6, Weekly Exercise (Minutes/Week): 360, Intensity: Moderate, Exercise limited by: orthopedic condition(s)  Diet (meal preparation, eat out, water intake, caffeinated beverages, dairy products, fruits and vegetables): in general, a "healthy" diet  , well balanced. eats a variety of fruits and vegetables daily, limits salt, fat/cholesterol, sugar,carbohydrates,caffeine, drinks 6-8 glasses of water daily.  Goals    . Patient Stated     I want get my knee as strong as possible. Stay as healthy and as independent as possible by continuing to exercise, eat healthy, travel, enjoy life.       Fall Risk Fall Risk  08/13/2018 07/07/2017 05/26/2016  Falls in the past year? No No No    Depression Screen PHQ 2/9 Scores 08/13/2018 07/07/2017 05/26/2016  PHQ - 2 Score 0 0 0    Cognitive Function       Ad8 score reviewed for issues:  Issues making decisions: no  Less interest in hobbies / activities: no  Repeats questions, stories (family complaining): no  Trouble using ordinary gadgets (microwave, computer, phone):no  Forgets the month or year: no  Mismanaging finances: no  Remembering appts: no  Daily problems with thinking and/or memory: no Ad8 score is= 0  Immunization History  Administered Date(s) Administered  . Hep A / Hep B 08/17/2007, 12/13/2007, 03/13/2008  . Hepatitis B 11/21/1996, 12/22/1996, 01/19/1997  . Influenza Whole 10/23/2006  . Influenza, High Dose  Seasonal PF 08/13/2018  . Influenza, Seasonal, Injecte, Preservative Fre 07/31/2008, 08/25/2011  . Influenza,inj,Quad PF,6+ Mos 08/22/2013, 10/08/2015  . Influenza-Unspecified 08/25/2014, 09/05/2016  . Pneumococcal Conjugate-13 03/13/2014  . Pneumococcal Polysaccharide-23 12/23/1998, 10/08/2015  . Pneumococcal-Unspecified 08/16/2007  . Td 05/18/2017  . Tdap 05/18/2017  . Zoster Recombinat (Shingrix) 05/18/2017   Screening Tests Health Maintenance  Topic Date Due  . INFLUENZA VACCINE  06/21/2018  . COLONOSCOPY  12/16/2024  . TETANUS/TDAP  05/19/2027  . Hepatitis C Screening  Completed  . PNA vac Low Risk Adult  Completed      Plan:     Continue doing brain stimulating activities (puzzles, reading, adult coloring books, staying active) to keep memory sharp.   Continue to eat heart healthy diet (full of  fruits, vegetables, whole grains, lean protein, water--limit salt, fat, and sugar intake) and increase physical activity as tolerated.  I have personally reviewed and noted the following in the patient's chart:   . Medical and social history . Use of alcohol, tobacco or illicit drugs  . Current medications and supplements . Functional ability and status . Nutritional status . Physical activity . Advanced directives . List of other physicians . Vitals . Screenings to include cognitive, depression, and falls . Referrals and appointments  In addition, I have reviewed and discussed with patient certain preventive protocols, quality metrics, and best practice recommendations. A written personalized care plan for preventive services as well as general preventive health recommendations were provided to patient.    Medical screening examination/treatment/procedure(s) were performed by non-physician practitioner and as supervising physician I was immediately available for consultation/collaboration. I agree with above. Scarlette Calico, MD    Michiel Cowboy, RN  08/13/2018

## 2018-08-14 ENCOUNTER — Encounter: Payer: Self-pay | Admitting: Internal Medicine

## 2018-08-14 LAB — PSA: PSA: 2.9

## 2018-08-14 LAB — PSA, TOTAL AND FREE
PSA, % Free: 24 % (calc) — ABNORMAL LOW (ref >25)
PSA, Free: 0.7 ng/mL
PSA, Total: 2.9 ng/mL (ref <=4.0)

## 2018-08-30 ENCOUNTER — Encounter: Payer: Self-pay | Admitting: Internal Medicine

## 2018-08-30 ENCOUNTER — Ambulatory Visit (INDEPENDENT_AMBULATORY_CARE_PROVIDER_SITE_OTHER): Payer: Medicare Other | Admitting: Internal Medicine

## 2018-08-30 ENCOUNTER — Other Ambulatory Visit (INDEPENDENT_AMBULATORY_CARE_PROVIDER_SITE_OTHER): Payer: Medicare Other

## 2018-08-30 VITALS — BP 112/80 | HR 71 | Temp 97.2°F | Ht 74.0 in | Wt 206.0 lb

## 2018-08-30 DIAGNOSIS — E785 Hyperlipidemia, unspecified: Secondary | ICD-10-CM | POA: Diagnosis not present

## 2018-08-30 DIAGNOSIS — Z0000 Encounter for general adult medical examination without abnormal findings: Secondary | ICD-10-CM

## 2018-08-30 DIAGNOSIS — E039 Hypothyroidism, unspecified: Secondary | ICD-10-CM

## 2018-08-30 DIAGNOSIS — N400 Enlarged prostate without lower urinary tract symptoms: Secondary | ICD-10-CM

## 2018-08-30 LAB — URINALYSIS, ROUTINE W REFLEX MICROSCOPIC
BILIRUBIN URINE: NEGATIVE
Hgb urine dipstick: NEGATIVE
Nitrite: NEGATIVE
RBC / HPF: NONE SEEN (ref 0–?)
SPECIFIC GRAVITY, URINE: 1.02 (ref 1.000–1.030)
Total Protein, Urine: NEGATIVE
Urine Glucose: NEGATIVE
Urobilinogen, UA: 0.2 (ref 0.0–1.0)
pH: 6 (ref 5.0–8.0)

## 2018-08-30 LAB — COMPREHENSIVE METABOLIC PANEL
ALT: 24 U/L (ref 0–53)
AST: 21 U/L (ref 0–37)
Albumin: 4.4 g/dL (ref 3.5–5.2)
Alkaline Phosphatase: 55 U/L (ref 39–117)
BUN: 28 mg/dL — ABNORMAL HIGH (ref 6–23)
CHLORIDE: 103 meq/L (ref 96–112)
CO2: 28 meq/L (ref 19–32)
Calcium: 9.7 mg/dL (ref 8.4–10.5)
Creatinine, Ser: 1 mg/dL (ref 0.40–1.50)
GFR: 78.43 mL/min (ref >60.00)
GLUCOSE: 96 mg/dL (ref 70–99)
POTASSIUM: 4 meq/L (ref 3.5–5.1)
SODIUM: 138 meq/L (ref 135–145)
Total Bilirubin: 0.7 mg/dL (ref 0.2–1.2)
Total Protein: 7.1 g/dL (ref 6.0–8.3)

## 2018-08-30 LAB — CBC WITH DIFFERENTIAL/PLATELET
BASOS PCT: 1 % (ref 0.0–3.0)
Basophils Absolute: 0.1 10*3/uL (ref 0.0–0.1)
EOS PCT: 2.5 % (ref 0.0–5.0)
Eosinophils Absolute: 0.2 10*3/uL (ref 0.0–0.7)
HCT: 45.8 % (ref 39.0–52.0)
Hemoglobin: 15.6 g/dL (ref 13.0–17.0)
LYMPHS ABS: 1.7 10*3/uL (ref 0.7–4.0)
Lymphocytes Relative: 23.8 % (ref 12.0–46.0)
MCHC: 34 g/dL (ref 30.0–36.0)
MCV: 96.2 fl (ref 78.0–100.0)
MONO ABS: 0.5 10*3/uL (ref 0.1–1.0)
Monocytes Relative: 7.5 % (ref 3.0–12.0)
NEUTROS ABS: 4.7 10*3/uL (ref 1.4–7.7)
NEUTROS PCT: 65.2 % (ref 43.0–77.0)
Platelets: 186 10*3/uL (ref 150.0–400.0)
RBC: 4.77 Mil/uL (ref 4.22–5.81)
RDW: 13.1 % (ref 11.5–15.5)
WBC: 7.2 10*3/uL (ref 4.0–10.5)

## 2018-08-30 LAB — TSH: TSH: 0.71 u[IU]/mL (ref 0.35–4.50)

## 2018-08-30 LAB — LIPID PANEL
CHOLESTEROL: 193 mg/dL (ref 0–200)
HDL: 71.3 mg/dL (ref >39.00)
LDL Cholesterol: 102 mg/dL — ABNORMAL HIGH (ref 0–99)
NonHDL: 121.68
TRIGLYCERIDES: 96 mg/dL (ref 0.0–149.0)
Total CHOL/HDL Ratio: 3
VLDL: 19.2 mg/dL (ref 0.0–40.0)

## 2018-08-30 MED ORDER — LEVOTHYROXINE SODIUM 150 MCG PO TABS: 150.0000 ug | ORAL_TABLET | Freq: Every day | ORAL | 0 refills | Status: DC

## 2018-08-30 NOTE — Patient Instructions (Signed)

## 2018-08-30 NOTE — Progress Notes (Signed)
Subjective:  Patient ID: Zachary Levy, male    DOB: 1946/06/14  Age: 72 y.o. MRN: [id number]  CC: Hypothyroidism and Annual Exam   HPI Zachary Levy presents for a CPX.  He has gained 4 pounds over the last 3 weeks.  He is not sure why.  He otherwise feels well and offers no complaints.  He is very active and denies CP, DOE, palpitations, edema, or fatigue.  Outpatient Medications Prior to Visit  Medication Sig Dispense Refill  . Emtricitab-Rilpivir-Tenofov AF (ODEFSEY PO) Take by mouth.    . meloxicam (MOBIC) 15 MG tablet Take 1 tablet (15 mg total) by mouth daily. 30 tablet 1  . raltegravir (ISENTRESS) 400 MG tablet Take 400 mg by mouth 2 (two) times daily.    Marland Kitchen levothyroxine (SYNTHROID, LEVOTHROID) 150 MCG tablet     . LEVOTHYROXINE SODIUM PO Take by mouth.     No facility-administered medications prior to visit.     ROS Review of Systems  Constitutional: Positive for unexpected weight change (wt gain). Negative for appetite change, chills, diaphoresis and fatigue.  HENT: Negative.   Eyes: Negative for visual disturbance.  Respiratory: Negative.  Negative for cough, chest tightness, shortness of breath and wheezing.   Cardiovascular: Negative for chest pain, palpitations and leg swelling.  Gastrointestinal: Negative for abdominal pain, constipation, diarrhea, nausea and vomiting.  Endocrine: Negative for cold intolerance and heat intolerance.  Genitourinary: Negative.  Negative for decreased urine volume, difficulty urinating, discharge, dysuria, frequency, hematuria, penile pain, penile swelling, scrotal swelling, testicular pain and urgency.  Musculoskeletal: Negative.  Negative for arthralgias and myalgias.  Skin: Negative.  Negative for color change and rash.  Neurological: Negative.  Negative for dizziness, weakness, light-headedness and headaches.  Hematological: Negative for adenopathy. Does not bruise/bleed easily.  Psychiatric/Behavioral: Negative.     Objective:    BP 112/80 (BP Location: Left Arm, Patient Position: Sitting, Cuff Size: Normal)   Pulse 71   Temp (!) 97.2 F (36.2 C) (Oral)   Ht 6\' 2"  (1.88 m)   Wt 206 lb (93.4 kg)   SpO2 97%   BMI 26.45 kg/m   BP Readings from Last 3 Encounters:  08/30/18 112/80  08/13/18 121/74  05/11/18 111/79    Wt Readings from Last 3 Encounters:  08/30/18 206 lb (93.4 kg)  08/13/18 202 lb (91.6 kg)  05/11/18 195 lb (88.5 kg)    Physical Exam  Constitutional: He is oriented to person, place, and time. No distress.  HENT:  Mouth/Throat: Oropharynx is clear and moist. No oropharyngeal exudate.  Eyes: Conjunctivae are normal. No scleral icterus.  Neck: Normal range of motion. Neck supple. No JVD present. No thyromegaly present.  Cardiovascular: Normal rate, regular rhythm and normal heart sounds. Exam reveals no gallop and no friction rub.  No murmur heard. Pulmonary/Chest: Effort normal and breath sounds normal. He has no wheezes. He has no rales.  Abdominal: Soft. Bowel sounds are normal. He exhibits no mass. There is no hepatosplenomegaly. There is no tenderness. Hernia confirmed negative in the right inguinal area and confirmed negative in the left inguinal area.  Genitourinary: Rectum normal, testes normal and penis normal. Rectal exam shows no external hemorrhoid, no internal hemorrhoid, no fissure, no mass, no tenderness, anal tone normal and guaiac negative stool. Prostate is enlarged (1+ smooth symm BPH). Prostate is not tender. Right testis shows no mass, no swelling and no tenderness. Left testis shows no swelling and no tenderness. Circumcised. No penile erythema or penile tenderness. No discharge  found.  Musculoskeletal: Normal range of motion. He exhibits no edema, tenderness or deformity.  Lymphadenopathy:    He has no cervical adenopathy. No inguinal adenopathy noted on the right or left side.  Neurological: He is alert and oriented to person, place, and time.  Skin: Skin is warm and dry.  No rash noted. He is not diaphoretic.  Psychiatric: He has a normal mood and affect. His behavior is normal. Judgment and thought content normal.  Vitals reviewed.   Lab Results  Component Value Date   WBC 7.2 08/30/2018   HGB 15.6 08/30/2018   HCT 45.8 08/30/2018   PLT 186.0 08/30/2018   GLUCOSE 96 08/30/2018   CHOL 193 08/30/2018   TRIG 96.0 08/30/2018   HDL 71.30 08/30/2018   LDLCALC 102 (H) 08/30/2018   ALT 24 08/30/2018   AST 21 08/30/2018   NA 138 08/30/2018   K 4.0 08/30/2018   CL 103 08/30/2018   CREATININE 1.00 08/30/2018   BUN 28 (H) 08/30/2018   CO2 28 08/30/2018   TSH 0.71 08/30/2018   PSA 2.9 08/14/2018   INR 1.0 02/12/2007    Dg Cervical Spine Complete  Result Date: 04/09/2013 *RADIOLOGY REPORT* Clinical Data: Neck and arm pain, no trauma CERVICAL SPINE - COMPLETE 4+ VIEW Comparison: None. Findings: The cervical vertebrae are in normal alignment.  There is degenerative disc disease at C5-6 and C6-7 with some loss of disc space, sclerosis, and spurring.  No prevertebral soft tissue swelling is seen.  There is mild foraminal narrowing at C5-6 with no significant narrowing at C6-7.  The odontoid process is intact. The lung apices are clear. IMPRESSION: Degenerative disc disease at C5-6 and C6-7 with mild foraminal narrowing at C5-6. Original Report Authenticated By: Ivar Drape, M.D.   Dg Knee 1-2 Views Left  Result Date: 04/09/2013 *RADIOLOGY REPORT* Clinical Data: Knee pain, no trauma LEFT KNEE - 1-2 VIEW Comparison: None. Findings: There may be very minimal decrease in medial joint space. No significant degenerative spurring is seen although there is a tiny spur emanating from the superior aspect of the patella.  No fracture is seen and no effusion is noted. IMPRESSION: Minimal degenerative change with some loss of medial joint space and minimal patellar spurring. Original Report Authenticated By: Ivar Drape, M.D.   Dg Knee 1-2 Views Right  Result Date:  04/09/2013 *RADIOLOGY REPORT* Clinical Data: Knee pain, no trauma RIGHT KNEE - 1-2 VIEW Comparison: None. Findings: Very minimal degenerative spurring is noted at the patellofemoral articulation.  No significant degenerative change is seen.  The joint spaces are relatively well preserved for age.  No effusion is noted. IMPRESSION: Minimal patellar spurring.  No significant degenerative change. Original Report Authenticated By: Ivar Drape, M.D.    Assessment & Plan:   Montey was seen today for hypothyroidism and annual exam.  Diagnoses and all orders for this visit:  Adult hypothyroidism- His TSH is in the normal range so I have asked him to stay on the current dose of levothyroxine. -     CBC with Differential/Platelet; Future -     TSH; Future -     levothyroxine (SYNTHROID, LEVOTHROID) 150 MCG tablet; Take 1 tablet (150 mcg total) by mouth daily before breakfast.  Benign prostatic hyperplasia without lower urinary tract symptoms- His PSA is low which is reassuring that he does not have prostate cancer.  He has no symptoms that need to be treated. -     Urinalysis, Routine w reflex microscopic; Future  Dyslipidemia, goal  LDL below 130- His ASCVD risk score is less than 15% so I do not recommend a statin for CV risk reduction. -     Lipid panel; Future -     Comprehensive metabolic panel; Future  Routine general medical examination at a health care facility- Exam completed, labs reviewed, vaccines reviewed, screening for colon cancer is up-to-date, patient education material was given.   I have discontinued Kamrin Delahoz's LEVOTHYROXINE SODIUM PO. I have also changed his levothyroxine. Additionally, I am having him maintain his raltegravir, Emtricitab-Rilpivir-Tenofov AF (ODEFSEY PO), and meloxicam.  Meds ordered this encounter  Medications  . levothyroxine (SYNTHROID, LEVOTHROID) 150 MCG tablet    Sig: Take 1 tablet (150 mcg total) by mouth daily before breakfast.    Dispense:  90 tablet     Refill:  0     Follow-up: Return in about 6 months (around 03/01/2019).  Scarlette Calico, MD

## 2018-08-31 LAB — CBC
Hematocrit: 47.1 % (ref 38.0–52.0)
Hemoglobin: 16.3 g/dL (ref 13.0–17.3)
MCH: 31.9 pg (ref 27.0–34.5)
MCHC: 34.6 g/dL (ref 32.0–36.0)
MCV: 92.2 fL (ref 84.0–100.0)
MPV: 8.8 fL (ref 7.2–13.2)
Platelets: 242 10*3/uL (ref 140–440)
RBC: 5.11 x10e6/mcL (ref 4.00–5.60)
RDW: 13 % (ref 11.0–16.0)
WBC: 6.9 10*3/uL (ref 3.8–10.6)

## 2018-08-31 LAB — PROSTATE SPECIFIC ANTIGEN, TOTAL: PSA: 0.02 ng/mL (ref 0.000–4.000)

## 2018-09-03 LAB — TESTOSTERONE, FREE/TOT EQUILIB
Testosterone % Free: 2.7 % (ref 1.50–4.20)
Testosterone, Free: 33.18 ng/dL — ABNORMAL HIGH (ref 5.00–21.00)
Testosterone: 1229 ng/dL — ABNORMAL HIGH (ref 264–916)

## 2018-09-17 ENCOUNTER — Telehealth: Payer: Self-pay

## 2018-09-17 DIAGNOSIS — M25561 Pain in right knee: Secondary | ICD-10-CM

## 2018-09-17 NOTE — Telephone Encounter (Signed)
Referral placed to [facility]. Patient is aware.

## 2018-09-17 NOTE — Telephone Encounter (Signed)
Zachary Levy to give him referral to [facility]  Right knee pain THANKS! [name]

## 2018-10-01 DIAGNOSIS — M25561 Pain in right knee: Secondary | ICD-10-CM | POA: Diagnosis not present

## 2018-10-08 NOTE — Nursing Note (Signed)
Adult Admission Assessment - Text       Perioperative Admission Assessment Entered On:  10/08/2018 14:15 EST    Performed On:  10/08/2018 14:08 EST by Vista Deck, RN, [place]   Call Start :   10/08/2018 14:09 EST   Call Complete :   10/08/2018 14:15 EST   Information Given By :   Self   Height/Length Estimated :   172 cm(Converted to: 5 ft 8 in, 5.64 ft, 67.72 in)    Weight   Estimated :   75 kg(Converted to: 165 lb 6 oz, 165.347 lb)    Primary Care Physician/Specialists :   DR. TED DUNN  DR. Maryfrances Bunnell   Emergency Contact Name :   [place]   Emergency Contact Phone :   [phone]   Languages :   Cleophus Molt   Preferred Communication Mode :   Verbal   LUTZ, RN, CARLA W - 10/08/2018 14:08 EST   Allergies   (As Of: 10/11/2018 07:05:12 EST)   Allergies (Active)   No Known Allergies  Estimated Onset Date:   Unspecified ; Created By:   Lavera Guise; Reaction Status:   Active ; Category:   Drug ; Substance:   No Known Allergies ; Type:   Allergy ; Updated By:   Lavera Guise; Source:   Patient ; Reviewed Date:   10/11/2018 7:01 EST        Medication History   Medication List   (As Of: 10/11/2018 07:05:12 EST)   Normal Order    Lactated Ringers Injection solution 1,000 mL  :   Lactated Ringers Injection solution 1,000 mL ; Status:   Ordered ; Ordered As Mnemonic:   Lactated Ringers Injection 1,000 mL ; Simple Display Line:   40 mL/hr, IV ; Ordering Provider:   Germaine Pomfret; Catalog Code:   Lactated Ringers Injection ; Order Dt/Tm:   10/11/2018 06:53:05 EST ; Comment:   Perioperative use ONLY  For Non Dialysis Patient          Sodium Chloride 0.9% intravenous solution 500 mL  :   Sodium Chloride 0.9% intravenous solution 500 mL ; Status:   Ordered ; Ordered As Mnemonic:   Sodium Chloride 0.9% 500 mL ; Simple Display Line:   10 mL/hr, IV ; Ordering Provider:   Jaquelyn Bitter,  TIMOTHY C; Catalog Code:   Sodium Chloride 0.9% ; Order Dt/Tm:   10/11/2018 06:53:05 EST ; Comment:    Perioperative use ONLY  For Dialysis Patient          Lactated Ringers Injection solution 1,000 mL  :   Lactated Ringers Injection solution 1,000 mL ; Status:   Ordered ; Ordered As Mnemonic:   Lactated Ringers Injection 1,000 mL ; Simple Display Line:   30 mL/hr, IV ; Ordering Provider:   Girard Cooter; Catalog Code:   Lactated Ringers Injection ; Order Dt/Tm:   10/11/2018 06:52:45 EST ; Comment:   for patients that DO NOT have Serum Creatinine > 2.5mg /dL, End-Stage Renal Disease, Hemodialysis          Sodium Chloride 0.9% intravenous solution 500 mL  :   Sodium Chloride 0.9% intravenous solution 500 mL ; Status:   Ordered ; Ordered As Mnemonic:   Sodium Chloride 0.9% 500 mL ; Simple Display Line:   15 mL/hr, IV ; Ordering Provider:   Girard Cooter; Catalog  Code:   Sodium Chloride 0.9% ; Order Dt/Tm:   10/11/2018 06:52:46 EST ; Comment:   For Patients with Serum Creatinine > 2.5mg /dL, End-Stage Renal Disease, Hemodialysis          ceFAZolin  :   ceFAZolin ; Status:   Ordered ; Ordered As Mnemonic:   ceFAZolin ; Simple Display Line:   2 g, IV Piggyback, On Call ; Ordering Provider:   Germaine Pomfret; Catalog Code:   ceFAZolin ; Order Dt/Tm:   10/11/2018 06:53:06 EST ; Comment:   Wt< 120 kg/264lb            Home Meds    testosterone  :   testosterone ; Status:   Documented ; Ordered As Mnemonic:   Testosterone Cypionate 200 mg/mL intramuscular solution ; Simple Display Line:   200 mg, 1 mL, IM, q2wk, 0 Refill(s) ; Ordering Provider:   Farrel Conners; Catalog Code:   testosterone ; Order Dt/Tm:   04/07/2016 14:05:42 EDT          omega-3 polyunsaturated fatty acids  :   omega-3 polyunsaturated fatty acids ; Status:   Documented ; Ordered As Mnemonic:   Fish Oil ; Simple Display Line:   1,000 mg, Oral, Daily, 0 Refill(s) ; Catalog Code:   omega-3 polyunsaturated fatty acids ; Order Dt/Tm:   04/07/2016 14:05:19 EDT          ibuprofen  :   ibuprofen ; Status:   Documented  ; Ordered As Mnemonic:   Advil 200 mg oral tablet ; Simple Display Line:   400 mg, 2 tabs, Oral, q4hr, PRN: for fever, 120 tabs, 0 Refill(s) ; Catalog Code:   ibuprofen ; Order Dt/Tm:   10/11/2018 07:02:43 EST            Problem History   (As Of: 10/11/2018 07:05:13 EST)   Problems(Active)    Basal cell carcinoma (SNOMED CT  :106269485 )  Name of Problem:   Basal cell carcinoma ; Recorder:   DAVIS, RN, JOY M; Confirmation:   Confirmed ; Classification:   Patient Stated ; Code:   [id number] ; Contributor System:   Conservation officer, nature ; Last Updated:   05/05/2017 11:17 EDT ; Life Cycle Date:   05/05/2017 ; Life Cycle Status:   Active ; Vocabulary:   SNOMED CT        History of prostate cancer (SNOMED CT  :9381829937 )  Name of Problem:   History of prostate cancer ; Recorder:   [place], RN, Rande Brunt; Confirmation:   Confirmed ; Classification:   Patient Stated ; Code:   [id number] ; Contributor System:   Conservation officer, nature ; Last Updated:   04/07/2016 14:05 EDT ; Life Cycle Date:   04/07/2016 ; Life Cycle Status:   Active ; Vocabulary:   SNOMED CT        Torn rotator cuff (SNOMED CT  :0175102585 )  Name of Problem:   Torn rotator cuff ; Recorder:   DAVIS, RN, JOY M; Confirmation:   Confirmed ; Classification:   Patient Stated ; Code:   [id number] ; Contributor System:   Conservation officer, nature ; Last Updated:   05/05/2017 11:17 EDT ; Life Cycle Date:   05/05/2017 ; Life Cycle Status:   Active ; Vocabulary:   SNOMED CT        Wears glasses (SNOMED CT  :614431540 )  Name of Problem:   Wears glasses ; Recorder:   LUTZ, RN, CARLA W; Confirmation:   Confirmed ; Classification:  Patient Stated ; Code:   [id number] ; Contributor System:   Conservation officer, nature ; Last Updated:   10/08/2018 14:12 EST ; Life Cycle Date:   10/08/2018 ; Life Cycle Status:   Active ; Vocabulary:   SNOMED CT          Procedure History        -    Procedure History   (As Of: 10/11/2018 07:05:13 EST)     Procedure Dt/Tm:   2007 ; Anesthesia Minutes:   0 ; Procedure Name:   Colonoscopy ; Procedure  Minutes:   0 ; Last Reviewed Dt/Tm:   10/11/2018 07:03:44 EST            Procedure Dt/Tm:   02/2011 ; Anesthesia Minutes:   0 ; Procedure Name:   Radical prostatectomy ; Procedure Minutes:   0 ; Last Reviewed Dt/Tm:   10/11/2018 07:03:44 EST            Anesthesia Minutes:   0 ; Procedure Name:   Tonsillectomy ; Procedure Minutes:   0 ; Last Reviewed Dt/Tm:   10/11/2018 07:03:44 EST            Procedure Dt/Tm:   08/2014 ; Anesthesia Minutes:   0 ; Procedure Name:   L4 wedge compression fracture, sequela ; Procedure Minutes:   0 ; Last Reviewed Dt/Tm:   10/11/2018 07:03:44 EST            Procedure Dt/Tm:   06/20/2016 08:12:00 EDT ; Location:   [facility] ; Provider:   Joanette Gula; Anesthesia Type:   Monitored Anesthesia Care ; :   MILLIRON-MD,  MATTHEW J; Anesthesia Minutes:   0 ; Procedure Name:   Colonoscopy with Biopsy / Brush ; Procedure Minutes:   17 ; Comments:     06/20/2016 8:40 EDT - PHILLIPS, RN, SHARON H  auto-populated from documented surgical case ; Clinical Service:   Surgery ; Last Reviewed Dt/Tm:   10/11/2018 07:03:44 EST            Anesthesia Minutes:   0 ; Procedure Name:   Mohs surgery ; Procedure Minutes:   0 ; Comments:     05/05/2017 11:17 EDT - DAVIS, RN, [place] ; Last Reviewed Dt/Tm:   10/11/2018 07:03:44 EST            Procedure Dt/Tm:   05/11/2017 09:11:00 EDT ; Location:   [facility] ; Provider:   Girard Cooter; Anesthesia Type:   General ; :   FERLA-MD,  BRIAN P; Anesthesia Minutes:   0 ; Procedure Name:   Shoulder Arthroscopy with Repair (Left) ; Procedure Minutes:   116 ; Comments:     05/11/2017 11:21 EDT - Simeon Craft, RN, OLIVIA N  auto-populated from documented surgical case ; Clinical Service:   Surgery ; Last Reviewed Dt/Tm:   10/11/2018 07:03:44 EST            History Confirmation   Problem History Changes PAT :   No   Procedure History Changes PAT :   No   Gilford Rile, RN, Webb Silversmith - 10/11/2018 6:58 EST   Anesthesia/Sedation   Anesthesia History :   Prior general  anesthesia   SN - Malignant Hyperthermia :   Denies   Previous Problem with Anesthesia :   None   Opioid Exposure :   Opioid Naive   Moderate Sedation History :   Prior sedation for procedure   Previous Problem With Sedation :  None   Symptoms of Sleep Apnea :   Age greater than 85, Male Gender   Pregnancy Status :   N/A   LUTZ, RN, CARLA W - 10/08/2018 14:08 EST   Bloodless Medicine   Will Patient Accept Blood Transfusion and/or Blood Products :   Yes   LUTZ, RN, CARLA W - 10/08/2018 14:08 EST   ID Risk Screen Symptoms   Recent Travel History :   No recent travel   TB Symptom Screen :   No symptoms   C. diff Symptom/History ID :   Neither of the above   LUTZ, RN, Edythe Lynn - 10/08/2018 14:08 EST   Social History   Social History   (As Of: 10/11/2018 07:05:13 EST)   Tobacco:        Former smoker, Pipe   Comments:  05/05/2017 11:08 - DAVIS, RN, JOY M: QUIT 2016   (Last Updated: 05/25/2017 11:17:05 EDT by Ardis Hughs, RN, Wilfred Lacy)          Alcohol:        Current, Wine, 3-5 times per week, 1 Number of Drinks Per Day.   (Last Updated: 05/05/2017 11:09:08 EDT by Rosana Hoes, RN, [place])          Substance Abuse:        Denies   (Last Updated: 01/01/2017 09:14:29 EST by CRAWFORD, RN, GREGORY A)            Advance Directive   Advance Directive :   Yes   Type of Advance Directive :   Living will, Medical durable power of attorney   LUTZ, RN, Edythe Lynn - 10/08/2018 14:08 EST   PAT Patient Instructions   Patient Arrival Time PAT :   10/11/2018 0:00 EST   Medications in AM :   NONE   Medication Understanding :   Verbalizes understanding   NPO PAT :   NPO after midnight   LUTZ, RN, CARLA W - 10/08/2018 14:08 EST   PAT Instructions Grid   Jewelry Understanding :   Verbalizes understanding   Perfume Understanding :   Verbalizes understanding   Valuables Understanding :   Verbalizes understanding   Clothing Understanding :   Verbalizes understanding   Contact MD for Illness :   Verbalizes understanding   Contact MD for skin injury :   Verbalizes  understanding   LUTZ, RN, Edythe Lynn - 10/08/2018 14:08 EST   Service Line PAT :   Ortho   Laterality PAT :   Right   Prep PAT :   Hibiclens   Name of Contact PAT :   DEBBIE Been   Relationship of Contact PAT :   WIFE   Contact Number PAT :   [phone]   Transportation Instructions PAT :   Accompany to [place] with 24 hours post-procedure   LUTZ, RN, CARLA W - 10/08/2018 14:08 EST   Harm Screen   Feels Unsafe at Home :   No   Suicidal Behavior :   None   Self Harming Behavior :   None   Suicidal Ideation :   None   Gilford Rile RNWebb Silversmith - 10/11/2018 6:58 EST

## 2018-10-11 NOTE — Nursing Note (Signed)
Nursing Discharge Summary - Text       Physician Discharge Summary Entered On:  10/11/2018 11:47 EST    Performed On:  10/11/2018 11:46 EST by Germaine Pomfret               DC Information   Provider Instructions for Diet :   A Healthy Diet   Provider Instructions for Activity :   Other: Strict nonweightbearing on the right upper extremity.  Use sling at all times except to perform pendulum exercises 3 times daily for 5 minutes each.   Provider Instructions for Wound Care :   Other: Keep dressing clean, dry, and intact for total 4 days.  You may then remove the dressing and run water over the incisions.  Do not submerge incisions.  Reapply Band-Aids and change daily.   Germaine Pomfret - 10/11/2018 11:46 EST

## 2018-10-11 NOTE — Case Communication (Signed)
Discharge Follow-Up Form - Text       Discharge Follow-Up Entered On:  10/16/2018 17:44 EST    Performed On:  10/16/2018 17:42 EST by Runell Gess, RN, [place]   Provider Follow-Up Post Discharge RTF :   Follow-Up Appointments    With:  [facility]  Address:  business (2), [address], [place], [place], [address];[phone] Business (1)  When:  In 2 weeks  Comment:  Follow up in 2 weeks as scheduled.    With:  TED DUNN-MD  Address:  business (1), [address], [place], [place], [address];[phone] Business (1)       Appis, RN, Janalyn Harder - 10/16/2018 17:42 EST   Surgery Evaluation   CM Surg DC Instr Clr :   Yes   CM Surg Pain Controlled :   Yes   CM Surg Nausea/Vomiting :   No   CM Surg FU Appt :   Yes   Appis, RN, Janalyn Harder - 10/16/2018 17:42 EST   Status   Case Status :   Completed   Appis, RN, Janalyn Harder - 10/16/2018 17:42 EST

## 2018-10-11 NOTE — Assessment & Plan Note (Signed)
PreOp Record - [facility]             PreOp Record - [facility] Summary                                                                     Primary Physician:        Janet Berlin    Case Number:              [id number]    Finalized Date/Time:      10/11/18 07:31:45    Pt. Name:                 Oscar Turner, Oscar Turner    D.O.B./Sex:               1946-06-17    Male    Med Rec #:                [id number]    Physician:                Janet Berlin    Financial #:              [id number]    Pt. Type:                 S    Room/Bed:                 /    Admit/Disch:              10/11/18 05:30:00 -    Institution:       [facility] Case Attendance - Preop                                                                                              Entry 1                         Entry 2                                                                          Case Attendee             CARROLL-MD, III,  Dan Humphreys, RN, Arnaldo Natal    Role Performed            Surgeon Primary                 Preoperative Nurse    Time In     Time Out     Last Modified By:         Dan Humphreys, RN, Corlis Hove, RN, Anne                              10/11/18 06:55:08               10/11/18 06:55:08      [facility] - Case Times - PreOp                                                                                                 Entry 1                                                                                                          Patient In Room Time      10/11/18 06:58:00               Nurse In Time                   10/11/18 06:58:00    Nurse Out Time            10/11/18 07:31:00               Patient Ready for               10/11/18 07:31:00                                                              Surgery/Procedure     Last Modified By:         Dan Humphreys, RN, Anne                              10/11/18  07:31:38      [facility] - Case Times -  PreOp Audit                                                                  10/11/18 07:31:38         Owner: Gretta Arab                               Modifier: Gretta Arab                                                        <+> 1         Patient Ready for Surgery/Procedure        <+> 1         Nurse Out Time                Finalized By: Dan Humphreys RN, Anne      Document Signatures                                                                             Signed By:           Dan Humphreys, RN, Anne 10/11/18 07:31

## 2018-10-11 NOTE — Op Note (Signed)
Phase II Record - [facility]             Phase II Record - [facility] Summary                                                                  Primary Physician:        Girard Cooter    Case Number:              [id number]    Finalized Date/Time:      10/11/18 13:06:45    Pt. Name:                 Oscar Turner, Oscar Turner    D.O.B./Sex:               29-Jul-1946    Male    Med Rec #:                [id number]    Physician:                Girard Cooter    Financial #:              [id number]    Pt. Type:                 S    Room/Bed:                 /    Admit/Disch:              10/11/18 05:30:00 -    Institution:       [facility] Case Attendance - Phase II                                                                                           Entry 1                                                                                                          Case Attendee             Orland Penman, RN, Iva Lento  Role Performed                  Post Anesthesia Care                                                                                              Nurse    Last Modified By:         Orland Penman RN, Jerene Pitch N                              10/11/18 13:06:36      [facility] - Case Times - Phase II                                                                                              Entry 1                                                                                                          Phase II In               10/11/18 12:12:00               Phase II Out                    10/11/18 13:06:00    Phase II Discharge        10/11/18 13:06:00    Time     Last Modified By:         Orland Penman, RN, BROOKE N                              10/11/18 13:06:41              Finalized By: LEHMAN, RN, [place]  Signed By:           Orland Penman RN, Jerene Pitch N 10/11/18  13:06

## 2018-10-11 NOTE — Discharge Summary (Signed)
 Inpatient Clinical Summary             [facility]  Post-Acute Care Transfer Instructions  PERSON INFORMATION   Name: Oscar, Turner  MRN: [id number]    FIN#: [id number]   PHYSICIANS  Admitting Physician: JULIAN DOUGLAS ELSIE FAIRY  Attending Physician: JULIAN DOUGLAS ELSIE FAIRY   PCP: DUNN-MD,  TED ALAN  Discharge Diagnosis:  Torn rotator cuff  Comment:       PATIENT EDUCATION INFORMATION  Instructions:             Anesthesia: After Your Surgery; Arthroscopic Rotator Cuff Repair ([id number])  Medication Leaflets:               Follow-up:                           With: Address: When:   Oscar Turner [address] [place], [place] [address]  [phone] Business (1) In 2 weeks 10/25/2018   Comments:   Follow up in 2 weeks as scheduled.       With: Address: When:   TED DUNN-MD [address] [place] [address]  [phone] Business (1)                              MEDICATION LIST  Medication Reconciliation at Discharge:          Medications that have not changed  Other Medications  ibuprofen  (Advil  200 mg oral tablet) 2 Tabs Oral (given by mouth) every 4 hours as needed for fever.  Last Dose:____________________  omega-3 polyunsaturated fatty acids (Fish Oil) 1,000 Milligram Oral (given by mouth) every day.  Last Dose:____________________  testosterone  (Testosterone  Cypionate 200 mg/mL intramuscular solution) 1 Milliliter Intramuscular (in a muscle) every other week.  Last Dose:____________________         Patient's Final Home Medication List Upon Discharge:           ibuprofen  (Advil  200 mg oral tablet) 2 Tabs Oral (given by mouth) every 4 hours as needed for fever.  omega-3 polyunsaturated fatty acids (Fish Oil) 1,000 Milligram Oral (given by mouth) every day.  testosterone  (Testosterone  Cypionate 200 mg/mL intramuscular solution) 1 Milliliter Intramuscular (in a muscle) every other week.         Comment:       ORDERS          Order Name Order Details    Discharge Patient 10/11/18 11:46:00 EST, Discharge Home/Self Care

## 2018-10-11 NOTE — Discharge Summary (Signed)
Inpatient Patient Summary               [facility]  [address] [place], [place] [address]  [phone]  Patient Discharge Instructions     Name: Oscar Turner, Oscar Turner  Current Date: 10/11/2018 11:53:36  DOB: 03/18/1946 [id number] FIN:NBR%>[id number]  Patient Address: [address] [place] [facility] [address]  Patient Phone: [phone]  Primary Care Provider:  Name: Glynis Smiles  Phone: [phone]   Immunizations Provided:      Discharge Diagnosis: Torn rotator cuff  Discharged To: TO, ANTICIPATED%>  Home Treatments: TREATMENTS, ANTICIPATED%>  Devices/Equipment: EQUIPMENT REHAB%>  Post Hospital Services: HOSPITAL SERVICES%>  Professional Skilled Services: SKILLED SERVICES%>  Therapist, sports and Community Resources: SERV AND COMM RES, ANTICIPATED%>  Mode of Discharge Transportation: TRANSPORTATION%>  Discharge Orders:          Discharge Patient 10/11/18 11:46:00 EST, Discharge Home/Self Care         Comment:   Medications  During the course of your visit, your medication list was updated with the most current information. The details of those changes are reflected below:          Medications that have not changed  Other Medications  ibuprofen (Advil 200 mg oral tablet) 2 Tabs Oral (given by mouth) every 4 hours as needed for fever.  Last Dose:____________________  omega-3 polyunsaturated fatty acids (Fish Oil) 1,000 Milligram Oral (given by mouth) every day.  Last Dose:____________________  testosterone (Testosterone Cypionate 200 mg/mL intramuscular solution) 1 Milliliter Intramuscular (in a muscle) every other week.  Last Dose:____________________       [facility] would like to thank you for allowing [place] to assist you with your healthcare needs. The following includes patient education materials and information regarding your injury/illness.   Oscar Turner, Oscar Turner has been given the following list of follow-up instructions, prescriptions, and patient education  materials:  Follow-up Instructions:              With: Address: When:   [facility] Radar [address] [place], [place] [address]  [phone] Business (1) In 2 weeks 10/25/2018   Comments:   Follow up in 2 weeks as scheduled.       With: Address: When:   [facility]UNN-MD [address], [address] [place], [place] [address]  [phone] Business (1)                   It is important to always keep an active list of medications available so that you can share with other providers and manage your medications appropriately. As an additional courtesy, we are also providing you with your final active medications list that you can keep with you.            ibuprofen (Advil 200 mg oral tablet) 2 Tabs Oral (given by mouth) every 4 hours as needed for fever.  omega-3 polyunsaturated fatty acids (Fish Oil) 1,000 Milligram Oral (given by mouth) every day.  testosterone (Testosterone Cypionate 200 mg/mL intramuscular solution) 1 Milliliter Intramuscular (in a muscle) every other week.      Take only the medications listed above. Contact your doctor prior to taking any medications not on this list.  Discharge instructions, if any, will display below     Instructions for Diet: INSTRUCTIONS FOR DIET%>A Healthy Diet  Instructions for Supplements: SUPPLEMENT INSTRUCTIONS%>  Instructions for Activity: INSTRUCTIONS FOR ACTIVITY%>Other: Strict nonweightbearing on the right  upper extremity.  Use sling at all times except to perform pendulum exercises 3 times daily for 5 minutes each.  Instructions for Wound Care: INSTRUCTIONS FOR WOUND CARE%>Other: Keep dressing clean, dry, and intact for total 4 days.  You may then remove the dressing and run water over the incisions.  Do not submerge incisions.  Reapply Band-Aids and change daily.     Medication leaflets, if any, will display below     Patient education materials, if any, will display below        Discharge Instructions: After Your Surgery   Youve just had surgery. During  surgery, you were given medicine called anesthesia to keep you relaxed and free of pain. After surgery, you may have some pain or nausea. This is common. Here are some tips for feeling better and getting well after surgery.       Stay on schedule with your medicine.    Going home   Your healthcare provider will show you how to take care of yourself when you go home. He or she will also answer your questions. Have an adult family member or friend drive you home. For the first 24 hours after your surgery:    Do not drive or use heavy equipment.    Do not make important decisions or sign legal papers.    Do not drink alcohol.    Have someone stay with you, if needed. He or she can watch for problems and help keep you safe.   Be sure to go to all follow-up visits with your healthcare provider. And rest after your surgery for as long as your healthcare provider tells you to.   Coping with pain   If you have pain after surgery, pain medicine will help you feel better. Take it as told, before pain becomes severe. Also, ask your healthcare provider or pharmacist about other ways to control pain. This might be with heat, ice, or relaxation. And follow any other instructions your surgeon or nurse gives you.   Tips for taking pain medicine   To get the best relief possible, remember these points:    Pain medicines can upset your stomach. Taking them with a little food may help.    Most pain relievers taken by mouth need at least 20 to 30 minutes to start to work.    Taking medicine on a schedule can help you remember to take it. Try to time your medicine so that you can take it before starting an activity. This might be before you get dressed, go for a walk, or sit down for dinner.    Constipation is a common side effect of pain medicines. Call your healthcare provider before taking any medicines such as laxatives or stool softeners to help ease constipation. Also ask if you should skip any foods. Drinking lots of  fluids and eating foods such as fruits and vegetables that are high in fiber can also help. Remember, do not take laxatives unless your surgeon has prescribed them.    Drinking alcohol and taking pain medicine can cause dizziness and slow your breathing. It can even be deadly. Do not drink alcohol while taking pain medicine.    Pain medicine can make you react more slowly to things. Do not drive or run machinery while taking pain medicine.   Your healthcare provider may tell you to take acetaminophen to help ease your pain. Ask him or her how much you are supposed to take each day. Acetaminophen or  other pain relievers may interact with your prescription medicines or other over-the-counter (OTC) medicines. Some prescription medicines have acetaminophen and other ingredients. Using both prescription and OTC acetaminophen for pain can cause you to overdose. Read the labels on your OTC medicines with care. This will help you to clearly know the list of ingredients, how much to take, and any warnings. It may also help you not take too much acetaminophen. If you have questions or do not understand the information, ask your pharmacist or healthcare provider to explain it to you before you take the OTC medicine.   Managing nausea   Some people have an upset stomach after surgery. This is often because of anesthesia, pain, or pain medicine, or the stress of surgery. These tips will help you handle nausea and eat healthy foods as you get better. If you were on a special food plan before surgery, ask your healthcare provider if you should follow it while you get better. These tips may help:    Do not push yourself to eat. Your body will tell you when to eat and how much.    Start off with clear liquids and soup. They are easier to digest.    Next try semi-solid foods, such as mashed potatoes, applesauce, and gelatin, as you feel ready.    Slowly move to solid foods. Dont eat fatty, rich, or spicy foods at first.    Do  not force yourself to have 3 large meals a day. Instead eat smaller amounts more often.    Take pain medicines with a small amount of solid food, such as crackers or toast, to avoid nausea.       Call your surgeon if.    You still have pain an hour after taking medicine. The medicine may not be strong enough.    You feel too sleepy, dizzy, or groggy. The medicine may be too strong.    You have side effects like nausea, vomiting, or skin changes, such as rash, itching, or hives.        If you have obstructive sleep apnea   You were given anesthesia medicine during surgery to keep you comfortable and free of pain. After surgery, you may have more apnea spells because of this medicine and other medicines you were given. The spells may last longer than usual.    At home:    Keep using the continuous positive airway pressure (CPAP) device when you sleep. Unless your healthcare provider tells you not to, use it when you sleep, day or night. CPAP is a common device used to treat obstructive sleep apnea.    Talk with your provider before taking any pain medicine, muscle relaxants, or sedatives. Your provider will tell you about the possible dangers of taking these medicines.      2000-2017 [employer] [address], [place], [place] [address]. All rights reserved. This information is not intended as a substitute for professional medical care. Always follow your healthcare professional's instructions.                POST-OPERATIVE HOME INSTRUCTIONS  SHOULDER ARTHROSCOPY WITH ROTATOR CUFF REPAIR  Roxanne Mins, MD - [facility]      Weight Bearing Status   NON-WEIGHTBEARING on the operative extremity immediately following surgery   Sling should be worn at all times  Pain Control   Discharge medications  Percocet 7.5/325 mg, 1-2 tablets every 4 to 6 hours as needed for pain  Zofran 4  mg, 1 tablet every 8 hours as needed for nausea  Buy over the counter Colace 100 mg and take 1 tablet twice  daily while on pain medication   Pain Medication: Percocet as needed  You've been provided with a script (as above), which you may take every 4-6 hours as needed.  Do not take the medication if you don't need it.  Do not take Tylenol with Norco or Percocet.  Do not drive or use heavy machinery while taking this medication  Do not drink alcohol while taking this medication   Additional Pain Control  Ibuprofen as needed, max dose 2,400 mg daily.  You should not take this with other NSAIDs, if you are on a blood thinner, if you have kidney disease, or if you have GI upset or a past GI bleed.  Trimed ice machine: You may use the machine as much as you would like.  Patients find great relief from it.  If you develop a rash, consider discontinuing the Trimed machine as it could be a cold rash.   Other Post Operative Instructions   Alarming signs and symptoms to report: Call the office immediately for guidance [phone].  Fever greater than 101.44F or 38C   Redness and warmth around the incision  Pus draining from the incision  Red streaking up the operative extremity  Inability to urinate 8 hours after surgery, which could be related to the anesthetic and medications   Activity  Non-weightbearing on the operative extremity  Sling should be worn at all times except to shower and change cloths, during which your elbow should remain at your side  You may perform activities that keep your elbows at your side, such as using a remote control or texting on a cellular phone   Dressings: Keep dressings clean, dry, and intact for a total of 4 days.   After 4 days, you may then remove the dressing and run water over the incisions in the shower.  Do not submerge the incisions.  Do not scrub incisions.  You may gently cleanse them with soap and water.  Do not apply ointments to the incisions.  Apply Band-Aids afterward showering and change daily.   Diet: If you had general anesthesia, start with clear liquids (7-Up,  Gingerale, tea, water) to minimize nausea.  If tolerated, advance to soup, crackers, toast and Jell-O, then a regular diet as tolerated.   Driving  No driving for the first two weeks  Initiation of driving depends on your progress.  It is possible you may be ready to drive between 2-6 weeks post-operatively, however this varies from person-to-person.  You may not drive while you are taking pain medication.  This is illegal.   NO SMOKING!  No smoking, no nicotine patch, no nicotine gum, no electronic cigarettes  o Nicotine = cuts the blood and slows tissue healing  o Risks = infection, wound complication, blood clots, pulmonary embolism    Post-Operative Appointments and Timeline for Overall Recovery   **The rotator cuff has poor blood supply and therefore takes a lengthy amount of time to heal.  Therefore, you need to follow the post-operative restriction in order to not stress and damage your rotator cuff repair.  For the same reason, pain could continue for 6-12 months following surgery, although this is the minority of cases.**   Immediately following surgery: You may experience shoulder pain, swelling, and tightness.   1st post-operative appointment: 10-14 days after surgery  If you have not already  scheduled followup for your first post-op appointment, call [phone] the day following surgery to make your first post-operative appointment.  Sutures will be removed  Sling immobilization at all times except for exercises.  It is okay to remove the sling when sitting awake in a chair.  It is best to wear it when you are asleep.  Physical therapy referral if you have not already started.  This will consist of passive range of motion exercises for the first 6 weeks after surgery.  No active motion in order to not stress the rotator cuff repair.  Pain medication: Your shoulder pain should be improving, however you may experience ongoing pain.  Attempt pain control with Tylenol and/or ibuprofen.   2nd  post-operative appointment: 6 weeks after surgery  Passive range of motion will likely be limited secondary to immobilization with the sling.  Advance to Active-Assisted Range of Motion with physical therapy and gradually advance to Active Range of Motion over 6 weeks.  Sling whenever out of the house for protection.  You may discontinue its use at home.  At 10 weeks, you may start gentle strengthening if you are doing well  Pain should be greatly improved at this point.  You will likely have pain from physical therapy.   3rd post-operative appointment: 12 weeks after surgery  Rotator cuff is 70-80% healed  Range of motion is nearly full  Strength is improving  Discontinue sling  Continue physical therapy for range of motion and strengthening  If you golf, you may start putting  No need for additional follow up visits unless you are not doing well.   16 weeks postop: You may start chipping (golf).  Continue physical therapy vs home exercises.   20 weeks postop: You may start using a driver (golf).  Continue shoulder exercises for at least one year after surgery.  Contact Information  If you have any problems or questions please call our office at [phone] and ask for [facility] during office hours 8:00a - 5:00p Monday - Friday. If you have a problem after hours, call [phone] and the answering service will contact the on-call physician assistant.            IS IT A STROKE? Act FAST and Check for these signs:    FACE                         Does the face look uneven?    ARM                         Does one arm drift down?    SPEECH                    Does their speech sound strange?    TIME                         Call 9-1-1 at any sign of stroke  Heart Attack Signs  Chest discomfort: Most heart attacks involve discomfort in the center of the chest and lasts more than a few minutes, or goes away and comes back. It can feel like uncomfortable pressure, squeezing, fullness or pain.  Discomfort in  upper body: Symptoms can include pain or discomfort in one or both arms, back, neck, jaw or stomach.  Shortness of breath: With or without discomfort.  Other signs: Breaking out in a cold  sweat, nausea, or lightheaded.  Remember, MINUTES DO MATTER. If you experience any of these heart attack warning signs, call 9-1-1 to get immediate medical attention!     ---------------------------------------------------------------------------------------------------------------------  [facility] allows you to manage your health, view your test results, and retrieve your discharge documents from your hospital stay securely and conveniently from your computer.  To begin the enrollment process, visit [email or web address] Click on "Sign up now" under [facility].

## 2018-10-11 NOTE — Op Note (Signed)
Phase I Record - [facility]             Phase I Record - [facility] Summary                                                                   Primary Physician:        Girard Cooter    Case Number:              [id number]    Finalized Date/Time:      10/11/18 13:06:25    Pt. Name:                 Oscar Turner, Oscar Turner    D.O.B./Sex:               1946-04-26    Male    Med Rec #:                [id number]    Physician:                Girard Cooter    Financial #:              [id number]    Pt. Type:                 S    Room/Bed:                 /    Admit/Disch:              10/11/18 05:30:00 -    Institution:       [facility] Case Attendance - Phase I                                                                                            Entry 1                         Entry 2                                                                          Case Attendee             CARROLL-MD, III,  [facility], RN, Charlestine Massed    Role Performed            Surgeon Primary                 Post Anesthesia Care                                                              Nurse    Time In     Time Out     Last Modified By:         Orland Penman RN, Felix Pacini, RN, Iva Lento                              10/11/18 11:35:25               10/11/18 11:35:25      MP[facility] - Case Times - Phase I                                                                                               Entry 1                                                                                                          Phase I In                10/11/18 11:42:00               Phase I Out                     10/11/18 12:12:00    Phase I Discharge         10/11/18 12:12:00    Time     Last Modified By:         Orland Penman, RN, BROOKE N                              10/11/18 13:06:24              Finalized By: LEHMAN, RN, Brenham  Signed By:           Orland Penman RN, Jerene Pitch N 10/11/18 13:06

## 2018-10-11 NOTE — Procedures (Signed)
 IntraOp Record - [facility]             IntraOp Record - [facility] Summary                                                                   Primary Physician:        JULIAN DOUGLAS ELSIE FAIRY    Case Number:              [phone]    Finalized Date/Time:      10/11/18 15:31:27    Pt. Name:                 Oscar Turner, Oscar Turner    D.O.B./Sex:               09/29/1946    Male    Med Rec #:                [id number]    Physician:                JULIAN DOUGLAS ELSIE FAIRY    Financial #:              [id number]    Pt. Type:                 S    Room/Bed:                 /    Admit/Disch:              10/11/18 05:30:00 -    Institution:       [facility] - Case Times                                                                                                         Entry 1                                                                                                          Patient      In Room Time  10/11/18 08:45:00               Out Room Time                   10/11/18 11:40:00    Anesthesia     Procedure      Start Time               10/11/18 09:26:00               Stop Time                       10/11/18 11:35:00    Last Modified By:         OKEY OBIE PATRICK B                              10/11/18 11:40:52      [facility] - Case Times Audit                                                                          10/11/18 11:40:52         Owner: CARIDAD                               Modifier: ROSSBR                                                        <+> 1         Out Room Time     10/11/18 11:35:32         Owner: CARIDAD                               Modifier: ROSSBR                                                        <+> 1         Stop Time     10/11/18 09:26:12         Owner: CARIDAD                               Modifier: ROSSBR                                                        <+> 1         Start Time        [facility] - Case Attendance  Entry 1                         Entry 2                         Entry 3                                          Case Attendee             SMITH-CRNA,  MARTHA D           CARROLL-MD, III,                ROBISON-PA,  TIMOTHY C                                                              ELSIE PAC    Role Performed            CRNA                            Surgeon Primary                 Other Authorized                                                                                              Personnel    Time In                   10/11/18 08:45:00               10/11/18 08:45:00               10/11/18 08:45:00    Time Out     Procedure                 Shoulder Arthroscopy            Shoulder Arthroscopy            Shoulder Arthroscopy                              Operative(Right)                Operative(Right)                Operative(Right)    Last Modified By:         OKEY, RN, PETRA KATHEE OKEY, RN, PETRA KATHEE OKEY, RN, BRIELLE B  10/11/18 11:15:43               10/11/18 11:15:43               10/11/18 11:15:43                                Entry 4                         Entry 5                         Entry 6                                          Case Attendee             ROSS, RN, BRIELLE B             Center,  SHELDA MOCHA, RN, SUSAN E    Role Performed            Circulator                      Surgical Scrub                  Circulator Relief    Time In                   10/11/18 08:45:00               10/11/18 08:45:00               10/11/18 11:01:00    Time Out     Procedure                 Shoulder Arthroscopy            Shoulder Arthroscopy            Shoulder Arthroscopy                              Operative(Right)                Operative(Right)                Operative(Right)    Last Modified By:         OKEY RN, PETRA KATHEE OKEY, RN, PETRA KATHEE OKEY, RN, BRIELLE B                              10/11/18 11:15:43               10/11/18 11:15:43               10/11/18 11:15:43      [facility] - Case Attendance Audit  10/11/18 11:15:43         Owner: CARIDAD                               Modifier: ROSSBR                                                            1     <+> Time In            1     <*> Procedure                              Shoulder Arthroscopy Operative(Right)            2     <+> Time In            2     <*> Procedure                              Shoulder Arthroscopy Operative(Right)            3     <+> Time In            3     <*> Procedure                              Shoulder Arthroscopy Operative(Right)            4     <+> Time In            4     <*> Procedure                              Shoulder Arthroscopy Operative(Right)            5     <+> Time In            5     <*> Procedure                              Shoulder Arthroscopy Operative(Right)        <+> 6         Case Attendee        <+> 6         Role Performed        <+> 6         Time In        <+> 6         Procedure     10/11/18 07:35:51         Owner: ROSSBR                               Modifier: ROSSBR                                                        <+>  3         Case Attendee        <+> 3         Role Performed        <+> 3         Procedure        <+> 4         Case Attendee        <+> 4         Role Performed        <+> 4         Procedure        <+> 5         Case Attendee        <+> 5         Role Performed        <+> 5         Procedure        [facility] - Skin Assessment                                                                          Pre-Care Text:            A.240 Assesses baseline skin condition Im.120 Implements protective measures to prevent skin or tissue injury           due to mechanical sources  Im.280.1 Implements progective measures to prevent skin or  tissue injury due to           thermal sources Im.360 Monitors for signs and symptons of infection                              Entry 1                                                                                                          Skin Integrity            Intact    Last Modified By:         OKEY RN, PETRA B                              10/11/18 07:38:02    Post-Care Text:            E.10 Evaluates for signs and symptoms of physical injury to skin and tissue E.270 Evaluate tissue perfusion           O.60 Patient is free from signs and symptoms of injury caused by extraneous objects   O.210 Patinet's tissue           perfusion is consistent with or improved from baseline levels  [facility] - Patient Positioning                                                                      Pre-Care Text:            A.240 Assesses baseline skin condition A.280 Identifies baseline musculoskeletal status A.280.1 Identifies           physical alterations that require additional precautions for procedure-specific positioning A.510.8 Maintains           patient's dignity and privacy Im.120 Implements protective measures to prevent skin/tissue injury due to           mechanical sources Im.40 Positions the patient Im.80 Applies safety devices                              Entry 1                                                                                                          Procedure                 Shoulder Arthroscopy            Body Position                   Lateral Right Up                              Operative(Right)    Left Arm Position         Extended on Padded Arm          Right Arm Position              Secured in Limb                              Board w/Security Strap                                          Positioner    Left Leg Position         Extended Security               Right Leg Position              Extended Security                              Strap, Flexed, Padding  Strap, Flexed, Pillow                              Under Knee                                                      Under Knee    Feet Uncrossed            Yes                             Pressure Points                 Yes                                                              Checked     Additional                spider limb positioner          Positioning Device              Bean Bag, Pillow, Heel    Information                                                                               pad, Foam Padding,                                                                                              Axillary Roll, Wrist                                                                                              Security Strap, Safety  Strap    Positioned By             SMITH-CRNA,  MARTHA D,          Outcome Met (O.80)              Yes                              CARROLL-MD, III,                              ELSIE PAC,                              ROBISON-PA,  TIMOTHY C,                              ROSS, RN, BRIELLE B    Last Modified By:         OKEY RN, PETRA B                              10/11/18 07:40:24    Post-Care Text:            E.10 Evaluates for signs and symptoms of physical injury to skin and tissue E.290 Evaluates musculoskeletal           status O.80 Patient is free from signs and symptoms of injury related to positioning O.120 the patient is free           from signs and symptoms of injury related to transfer/transport  O.250 Patient's musculoskeletal status is           maintained at or improved from baseline levels      [facility] - Skin Prep                                                                                Pre-Care Text:            A.30 Verifies allergies A.20 Verifies procedure, surgical site, and laterality A.510.8 Maintains paritnet's           dignity and privacy  Im.270 Performs Skin Preparation Im.270.1 Implements protective measures to prevent skin           and tissue injury due to chemical sources  A.300.1 Protects from cross-contamination                              Entry 1  Hair Removal     Skin Prep      Prep Agents (Im.270)     Chlorhexidine Gluconate         Prep Area (Im.270)              Arm, Shoulder                              2% w/Alcohol     Prep Area Details        Right                           Prep By                         JULIAN DOUGLAS ELSIE FAIRY    Outcome Met (O.100)       Yes    Last Modified By:         OKEY RN, PETRA B                              10/11/18 07:38:25    Post-Care Text:            E.10 Evaluates for signs and symptoms of physical injury to skin and tissue O.100 Patient is free from signs           and symptoms of chemical injury  O.740 The patient's right to privacy is maintained      [facility] - Counts Initial and Final                                                                 Pre-Care Text:            A.20 Verifies operative procedure, sugical site, and laterality A.20.2 Assesses the risk for unintended           retained foreign body Im.20 Performs required counts                              Entry 1  Initial Counts      Initial Counts           [place],  [place],           Items included in               Sponges, Sharps     Performed By             [employer], RN, BRIELLE B             the Initial Count     Final Counts      Final Counts             ROSS, RN, BRIELLE B,            Final Count Status              Correct     Performed By             [place],  [place]GET[place]     Items Included in        Sponges, Sharps     Final  Count     Outcome Met (O.20)        Yes    Last Modified By:         OKEY RN, PETRA B                              10/11/18 11:32:53    Post-Care Text:            E.50 Evaluates results of the surgical count O.20 Patient is free from unintended retained foreign objects      [facility] - Counts Initial and Final Audit                                                            10/11/18 11:32:53         Owner: ROSSBR                               Modifier: ROSSBR                                                        <+> 1         Outcome Met (O.20)        [facility] - Counts Additional                                                                        Pre-Care Text:            A.20 Verifies operative procedure, sugical site, and laterality A.20.2 Assesses the risk for unintended           retained foreign body Im.20 Performs required counts  Entry 1                                                                                                          Additional Count          Closing Count                   Additional Count                ROSS, RN, BRIELLE B,    Type                                                      Participants                    [facility],  [place]    Count Status              Correct                         Items Counted                   Sponges, Sharps    Outcome Met (O.20)        Yes    Last Modified By:         OKEY RN, PETRA B                              10/11/18 09:48:27    Post-Care Text:            E.50 Evaluates results of the surgical count O.20 Patient is free from unintended retained foreign objects      [facility] - Counts Additional Audit                                                                   10/11/18 09:48:27         Owner: ROSSBR                               Modifier: ROSSBR                                                        <+> 1         Outcome Met (O.20)        [facility] - General Case Data  Pre-Care Text:            A.350.1 Classifies surgical wound                              Entry 1                                                                                                          Case Information      ASA Class                2                               Case Level                      Level 3     OR                       MP 02                           Specialty                       Orthopedic (SN)     Wound Class              1-Clean    Preop Diagnosis           RCT                             Postop Diagnosis                RCT    Last Modified By:         OKEY OBIE PATRICK B                              10/11/18 08:55:10    Post-Care Text:            O.760 Patient receives consistent and comparable care regardless of the setting      [facility] - General Case Data Audit                                                                   10/11/18 08:55:10         Owner: ROSSBR                               Modifier: ROSSBR                                                        <+>  1         ASA Class        [facility] - Fire Risk Assessment                                                                                               Entry 1                                                                                                          Fire Risk                 Alcohol Based Prep              Fire Risk Score                 3    Assessment: If            Solution, Surgical Site    checked, checkmark        Above Xiphoid, Ignition    = 1 point                 Source In Use    Last Modified By:         OKEY, RN, PETRA B                              10/11/18 07:36:35      [facility] - Time Out                                                                                 Pre-Care Text:            A.10 Confirms patient identity A.20 Verifies operative procedure, surgical site, and laterality A.20.1 Verifies           consent for planned procedure A.30 Verifies allergies                               Entry 1  Procedure                 Shoulder Arthroscopy            Is everyone ready               Yes                              Operative(Right)                to perform time out     Have all members of       Yes                             Patient name and                Yes    the surgical team                                         DOB confirmed     been introduced     Allergies discussed       Yes                             Surgical procedure              Yes                                                              to be performed                                                               confirmed and                                                               verified by                                                               completed surgical                                                               consent     Correct surgical  Yes                             Correct laterality              Yes    site marked and                                           confirmed     initials are     visible through     prepped and draped     field (or     alternative ID band     used)     Correct patient           Yes                             Surgeon shares                  Yes    position confirmed                                        operative plan,                                                               possible                                                               difficulties,                                                               expected duration,                                                               anticipated blood                                                               loss and reviews  all                                                                critical/specific                                                               concerns     Required blood            Yes                             Essential imaging               Yes    products, implants,                                       available and fetal     devices and/or                                            heartones confirmed     special equipment                                         (if applicable)     available and     sterility confirmed     VTE prophylaxis           Yes                             Antibiotics ordered             Yes    addressed                                                 and administered     Anesthesia shares         Yes                             Fire risk                       Yes    anesthetic plan and                                       assessment scored     reviews patient  and plan discussed     specific concerns     Appropriate drying        Yes                             Surgeon states:                 Yes    time for prep                                             Does anyone have     observed before                                           any concerns? If     draping                                                   you see, suspect,                                                               or feel that                                                               patient care is                                                               being compromised,                                                               speak up for                                                               patient safety     Time Out Complete         10/11/18 09:23:00    Last Modified By:         OKEY, RN, BRIELLE B  10/11/18 09:23:54    Post-Care Text:            E.30 Evaluates verification process for correct patient, site, side, and level surgery      [facility] - Time Out Audit                                                                             10/11/18 09:23:54         Owner: ROSSBR                               Modifier: ROSSBR                                                            1     <*> Procedure                              Shoulder Arthroscopy Operative(Right)            1     <+> Time Out Complete        [facility] - Debrief                                                                                  Pre-Care Text:            Im.330 Manages specimen handling and disposition                              Entry 1                                                                                                          Procedure                 Shoulder Arthroscopy            Final counts                    Yes  Operative(Right)                correct and                                                               verbally verified                                                               with                                                               surgeon/licensed                                                               independent                                                               practitioner (if                                                               applicable)     Actual procedure          Yes                             Postop diagnosis                Yes    performed confirmed                                       confirmed     Wound                     Yes                             Confirm specimens               Yes    classification  and specimens     confirmed                                                 labeled                                                               appropriately (if                                                               applicable)     Foley catheter            Yes                             Patient recovery                Yes    removed  (if                                               plan confirmed     applicable)     Debrief Complete          10/11/18 11:32:00    Last Modified By:         OKEY RN, PETRA B                              10/11/18 11:33:02    Post-Care Text:            E.800 Ensures continuity of care E.50 Evaluates results of the surgical count O.30 Patient's procedure is           performed on the correct site, side, and level O.50 patient's current status is communicated throughout the           continuum of care O.40 Patient's specimen(s) is managed in the appropriate manner      [facility] - Debrief Audit                                                                             10/11/18 11:33:02         Owner: ROSSBR                               Modifier: ROSSBR  1     <*> Procedure                              Shoulder Arthroscopy Operative(Right)            1     <+> Debrief Complete        [facility] - Patient Care Devices                                                                     Pre-Care Text:            A.200 Assesses risk for normothermia regulation A.40 Verifies presence of prosthetics or corrective devices           Im.280 Implements thermoregulation measures Im.60 Uses supplies and equipment within safe parameters                              Entry 1                         Entry 2                                                                          Equipment Type            MACHINE SEQUENTIAL              BAIR HUGGER                              COMPRESSION    SCD Sleeve Site           Legs Bilateral    Equipment/Tag Number      [id number]                       [id number]    Initiated Pre             Yes                             Yes    Induction     Last Modified By:         OKEY RN, PETRA KATHEE OKEY, RN, BRIELLE B                              10/11/18 07:38:44               10/11/18 07:38:44    Post-Care Text:            E.10 Evaluates signs  and symptoms of physical injury to skin and tissue O.60 Patient is free from signs and  symptoms of injury caused by extraneous objects      [facility] - Medications                                                                              Pre-Care Text:            A.10 Confirms patient identity A.30 Verifies allergies Im.220 Administers prescribed medications Im.220.2           Administers prescribed antibiotic therapy as ordered                              Entry 1                         Entry 2                                                                          Time Administered         10/11/18 09:25:00               10/11/18 09:30:00    Medication                BUPIVACAINE 0.25%               EPINEPRINE 1:1000 - 30CC                              EPINEPHRINE INJECTION                                 Route of Admin            Local Injection                 Irrigation    Dose/Volume               7ml                             3CC PER 3L BAG OF LR    (include amount and     unit of measure)     Site                      Shoulder                        Shoulder    Site Detail               Right                           Right    Administered By  CARROLL-MD, III,                CARROLL-MD, III,                              WILLIAM JOSEPH                  WILLIAM JOSEPH    Outcome Met (O.130)       Yes                             Yes    Last Modified By:         OKEY, RN, PETRA KATHEE OKEY, RN, BRIELLE B                              10/11/18 09:30:46               10/11/18 09:30:46    Post-Care Text:            E.20 Evaluates response to medications O.130 Patient receives appropriately administerd medication(s)      [facility] - Medications Audit                                                                         10/11/18 09:30:46         Owner: ROSSBR                               Modifier: ROSSBR                                                            1     <*> Medication                              BUPIVACAINE 0.25% EPINEPHRINE INJECTION            1     <+> Dose/Volume (include amount and                      unit of measure)            1     <+> Outcome Met (O.130)        <+> 2         Medication        <+> 2         Route of Admin        <+> 2         Administered By        <+> 2         Dose/Volume (include amount and  unit of measure)        <+> 2         Time Administered        <+> 2         Outcome Met (O.130)        <+> 2         Site        <+> 2         Site Detail        [facility] - Implants/Endoscopy Stents                                                                Pre-Care Text:            A.20 Verifies operative procedure, surgical site, and laterality A.20.1 Verifies consent for planned procedure           Im.350 Records implants inserted during the operative or invasive procedure                              Entry 1                         Entry 2                         Entry 3                                          Implant/Explant           Implant                         Implant                         Implant    Catalog #                [id number]                        [id number]                        [id number]    Implant     Identification      Description              ANCHOR HEALICOIL PK             ANCHOR HEALICOIL PK             ANCHOR Q-FIX ALL-SUTURE                              5. W/ 3 # 2                 5. W/ 3 # 2                 2.  CINDERELLA ALBERTEEN AMARYLLIS CINDERELLA ALBERTEEN BLU     Expiration Date          04/04/23                        06/10/23                        02/23/21     Lot Number               [id number]                         [id number]                         [id number]     Unique ID Number      Manufacturer             [employer] AND NEPHEW [facility] AND NEPHEW [facility] AND NEPHEW INC     Serial Number     Usage Data      Implant Site             right  shoulder                  right shoulder                  right shoulder     Quantity                 1                               1                               1     Last Modified By:         OKEY RN, PETRA KATHEE OKEY, RN, PETRA KATHEE OKEY, RN, BRIELLE B                              10/11/18 09:48:16               10/11/18 09:48:16               10/11/18 10:10:32                                Entry 4  Implant/Explant           Implant    Catalog #                [id number]    Implant     Identification      Description              ANCHOR PEEK KNOTLESS                              QUATTRO LINK 4.5MM     Expiration Date          02/18/23     Lot Number               [id number]     Unique ID Number      Manufacturer             [employer]     Serial Number     Usage Data      Implant Site             right shoulder     Quantity                 2    Last Modified By:         OKEY RN, PETRA B                              10/11/18 10:59:56    Post-Care Text:            E.30 Evaluates verification process for correct patient, site, side and level surgery O.30 Patient's procedure           is performed on the correct site, side, and level      [facility] - Implants/Endoscopy Stents Audit                                                           10/11/18 10:59:56         Owner: ROSSBR                               Modifier: ROSSBR                                                        <+> 4         Description        <+> 4         Lot Number        <+> 4         Manufacturer        <+> 4         Expiration Date        <+> 4         Implant Site        <+> 4         Quantity        <+> 4  Catalog #        <+> 4         Implant/Explant     10/11/18 10:10:32         Owner: ROSSBR                               Modifier: ROSSBR                                                        <+> 3          Description        <+> 3         Lot Number        <+> 3         Manufacturer        <+> 3         Expiration Date        <+> 3         Implant Site        <+> 3         Quantity        <+> 3         Catalog #        <+> 3         Implant/Explant        MPOR - Dressing/Packing                                                                         Pre-Care Text:            A.350 Assesses susceptibility for infection Im.250 Administers care to invasive devices Im.290 Administer care           to wound sites  Im.300 Implements aseptic technique                              Entry 1                                                                                                          Site                      Shoulder                        Site Details                    Right    Dressing  Item     Details      Dressing Item            Medicated Gauze, 4x4's,         Miscellaneous                   Shoulder     (Im.290)                 Occlusive Dressing              (Im.290)                        Immobilizer/Sling    Last Modified By:         OKEY RN, PETRA B                              10/11/18 07:36:29    Post-Care Text:            E.320 Evaluate factors associted with increased risk for postoperative infection at the completion of the           procedure O.200 Patient's wound perfusion is consistent with or improved from baseline levels  O.Patient is           free from signs and symptoms of infection      MP[facility] - Procedures                                                                               Pre-Care Text:            A.20 Verifies operative procedure, surgical site, and laterality Im.150 Develops individualized plan of care                              Entry 1                                                                                                          Procedure     Description      Procedure                Shoulder Arthroscopy            Modifiers                       Right                               Operative     Surgical Procedure       RIGHT ARTHROSCOPIC  Text                     ROTATOR CUFF REPAIR    Primary Procedure         Yes                             Primary Surgeon                 JULIAN DOUGLAS ELSIE FAIRY    Start                     10/11/18 09:26:00               Stop                            10/11/18 11:35:00    Anesthesia Type           General                         Surgical Service                Orthopedic (SN)    Wound Class               1-Clean    Last Modified By:         OKEY OBIE PATRICK B                              10/11/18 11:35:32    Post-Care Text:            O.730 The patinet's care is consistent with the individualized perioperative plan of care      MP[facility] - Procedures Audit                                                                          10/11/18 11:35:32         Owner: ROSSBR                               Modifier: ROSSBR                                                        <+> 1         Stop  MPOR - Transfer                                                                                                           Entry 1                                                                                                          Transferred By            SMITH-CRNA,  MARTHA D,          Via                             Stretcher                              ROSS, RN, PETRA NOVAK    Post-op Destination       PACU    Skin Assessment      Condition                Intact    Last Modified By:         OKEY OBIE PETRA B                              10/11/18 07:38:00      Case Comments                                                                                         <None>              Finalized By: Lorella Jeoffrey HERO      Document Signatures                                                                             Signed By:  ROSS, RN,  PETRA B 10/11/18 11:40          Lorella Jeoffrey HERO 10/11/18 15:31      Unfinalized History                                                                                     Date/Time            Username    Reason for Unfinalizing         Freetext Reason for Unfinalizing                                          10/11/18 15:30       [facility]   Charging

## 2018-10-11 NOTE — Anesthesia Pre-Procedure Evaluation (Signed)
Preanesthesia Evaluation        Patient:   Oscar Turner, Oscar Turner            MRN: [id number]            FIN: [id number]               Age:   72 years     Sex:  Male     DOB:  10-24-1946   Associated Diagnoses:   None   Author:   Leontine Radman-MD,  Ladora Daniel      Preoperative Information   NPO:  NPO greater than 8 hours.    Anesthesia history     Patient's history: negative.     Family's history: negative.        Health Status   Allergies:    Allergic Reactions (Selected)  No Known Allergies   Current medications:    Home Medications (3) Active  Advil 200 mg oral tablet 400 mg = 2 tabs, PRN, Oral, q4hr  Fish Oil 1,000 mg, Oral, Daily  Testosterone Cypionate 200 mg/mL intramuscular solution 200 mg = 1 mL, IM, q2wk  ,    Medications (5) Active  Scheduled: (1)  ceFAZolin duplex  2 g 50 mL, IV Piggyback, On Call  Continuous: (4)  Lactated Ringers Injection solution 1,000 mL  1,000 mL, IV, 40 mL/hr  Lactated Ringers Injection solution 1,000 mL  1,000 mL, IV, 30 mL/hr  Sodium Chloride 0.9% intravenous solution 500 mL  500 mL, IV, 10 mL/hr  Sodium Chloride 0.9% intravenous solution 500 mL  500 mL, IV, 15 mL/hr  PRN: (0)     Problem list:    Active Problems (4)  Basal cell carcinoma   History of prostate cancer   Torn rotator cuff   Wears glasses         Histories   Past Medical History:    No active or resolved past medical history items have been selected or recorded.   Procedure history:    Shoulder Arthroscopy with Repair (Left) on 05/11/2017 at 72 Years.  Comments:  05/11/2017 11:21 EDT - Simeon Craft, RN, OLIVIA N  auto-populated from documented surgical case  Colonoscopy with Biopsy / Brush on 06/20/2016 at 55 Years.  Comments:  06/20/2016 8:40 EDT - PHILLIPS, RN, SHARON H  auto-populated from documented surgical case  L4 wedge compression fracture, sequela ([id number]) in the month of 08/2014 at 16 Years.  Radical prostatectomy ([id number]) in the month of 02/2011 at 42  Years.  Colonoscopy ([id number]) in 2007 at 69 Years.  Tonsillectomy [phone]).  Mohs surgery ([phone]).  Comments:  05/05/2017 11:17 EDT - DAVIS, RN, [facility]    Alcohol  05/05/2017  Use: Current    Type: Wine    Frequency: 3-5 times per week    Number of Drinks Per Day 1    Substance Abuse  01/01/2017  Use: Denies    Tobacco  05/25/2017  Use: Former smoker    Type: Pipe    Comment: QUIT 2016 - 05/05/2017 11:08 - DAVIS, RN, [place] M  .        Physical Examination   Vital Signs   09/14/8526 7:82 EST Systolic Blood Pressure 423 mmHg    Diastolic Blood Pressure 81 mmHg    Temperature Oral 36.8 degC    Heart Rate Monitored 57 bpm  LOW    Respiratory Rate 16 br/min  SpO2 96 %    SBP/DBP Cuff Details Left arm         Vital Signs (last 24 hrs)_____  Last Charted___________  Temp Oral     36.8 degC  (NOV 21 07:10)  Resp Rate         16 br/min  (NOV 21 07:10)  SBP      123 mmHg  (NOV 21 07:10)  DBP      81 mmHg  (NOV 21 07:10)  SpO2      96 %  (NOV 21 07:10)  Weight      75 kg  (NOV 21 07:10)  Height      172 cm  (NOV 21 07:10)     Measurements from flowsheet : Measurements   10/11/2018 7:10 EST Height/Length Measured 172 cm    Patient Stated Height/Length 172 cm    Weight Measured 75 kg    Weight Dosing 75 kg    Body Mass Index est meas 25.35      Pain assessment:  Pain Assessment   10/11/2018 7:10 EST Numeric Rating Pain Scale 3    Primary Pain Location Shoulder    Primary Pain Laterality Right    Primary Pain Quality Aching      .       Review / Management   Results review:     No qualifying data available, Lab results: 10/11/2018 7:16 EST      Estimated Creatinine Clearance            82.33 mL/min  .       Assessment and Plan   American Society of Anesthesiologists#(ASA) physical status classification:  Class II.    Anesthetic Preoperative Plan     Anesthetic technique: Inhalation.     Maintenance airway: Laryngeal mask airway.      Signature Line     Electronically Signed on 10/11/2018 08:07 AM EST   ________________________________________________   Brantlee Penn-MD,  Ladora Daniel

## 2018-10-22 DIAGNOSIS — M25561 Pain in right knee: Secondary | ICD-10-CM | POA: Diagnosis not present

## 2018-10-31 NOTE — Progress Notes (Signed)
Outpatient PT Certification Letter-Text       PT Outpatient Certification Letter Entered On:  10/31/2018 14:39 EST    Performed On:  10/31/2018 14:39 EST by Luisa Hart, PT, [facility]               Physician Certification   Date of Injury or Surgery :   10/11/2018 EST   Ordering Physician Name :   Janet Berlin   Number of Visits This Interval :   1   Dear Physician :   Thank you for your referral. Below is the patient information for the stated interval of treatment. Please review, modify (if necessary), sign, and return. Thank you.   Date of Evaluation :   10/31/2018 EST   PT Certification Interval End :   01/28/2019 EDT   Physician Signature Required :   Yes   Staff Physician Signature :   The physician's electronic signature noted above indicates approval of the documented Plan of Care for the stated interval.   PATRICK, PT, [facility] - 10/31/2018 14:39 EST   Problem List   (As Of: 10/31/2018 14:39:35 EST)   Problems(Active)    Basal cell carcinoma (SNOMED CT  :517616073 )  Name of Problem:   Basal cell carcinoma ; Recorder:   DAVIS, RN, JOY M; Confirmation:   Confirmed ; Classification:   Patient Stated ; Code:   [id number] ; Contributor System:   Dietitian ; Last Updated:   05/05/2017 11:17 EDT ; Life Cycle Date:   05/05/2017 ; Life Cycle Status:   Active ; Vocabulary:   SNOMED CT        History of prostate cancer (SNOMED CT  :5462703500 )  Name of Problem:   History of prostate cancer ; Recorder:   [place]ad, RN, [place] Scrape; Confirmation:   Confirmed ; Classification:   Patient Stated ; Code:   [id number] ; Contributor System:   Dietitian ; Last Updated:   04/07/2016 14:05 EDT ; Life Cycle Date:   04/07/2016 ; Life Cycle Status:   Active ; Vocabulary:   SNOMED CT        Torn rotator cuff (SNOMED CT  :1696789381 )  Name of Problem:   Torn rotator cuff ; Recorder:   DAVIS, RN, JOY M; Confirmation:   Confirmed ; Classification:   Patient Stated ; Code:   [id number] ; Contributor System:   Dietitian ; Last Updated:    05/05/2017 11:17 EDT ; Life Cycle Date:   05/05/2017 ; Life Cycle Status:   Active ; Vocabulary:   SNOMED CT        Wears glasses (SNOMED CT  :277824235 )  Name of Problem:   Wears glasses ; Recorder:   LUTZ, RN, CARLA W; Confirmation:   Confirmed ; Classification:   Patient Stated ; Code:   [id number] ; Contributor System:   Dietitian ; Last Updated:   10/08/2018 14:12 EST ; Life Cycle Date:   10/08/2018 ; Life Cycle Status:   Active ; Vocabulary:   SNOMED CT          Diagnoses(Active)    Pain in right shoulder  Date:   10/31/2018 ; Diagnosis Type:   Other ; Confirmation:   Differential ; Clinical Dx:   Pain in right shoulder ; Classification:   Interdisciplinary ; Clinical Service:   Non-Specified ; Code:   ICD-10-CM ; Probability:   0 ; Diagnosis Code:   M25.511        Plan   PT Frequency Outpatient :  Other: 2x/week   PT Duration Outpatient :   90    PT Duration Unit Outpatient :   Days   PT Anticipated Treatments, Needs :   Electrical stimulation, Joint mobilization, Manual therapy, Posture/Body mechanics training, Patient education, Soft tissue massage/MFR, Therapeutic exercises, Vasopneunmatic cryocompression   PT Certification Letter Complete :   Yes   PATRICK, PT, [facility] - 10/31/2018 14:39 EST   Outpatient Review   *Clinical Assessment Summary :   Pt. is a 72 y/o male s/p R RC repair c biceps tenodesis on 10/11/18.  Pt. presents c impaired R shoulder PROM in all directions as well as significant muscle weakness.  He was instructed in Codman's ex. today and received PROM in all directions c very gentle overpressure.  He felt decreased pain after tx. today and will use his cryotherapy unit at home to alleviate residual soreness.  Pt. will benefit from PT intervention to restore optimal R UE functional mobility.      Rehab Potential Physical Therapy :   Judeth Horn, PT, [facility] - 10/31/2018 14:39 EST   Prior Functional Level Grid   ADL :   Independent   Mobility :   Independent   Instrumental ADL :   Independent    Cognitive-Communication Skills :   Independent   PATRICK, PT, [facility] - 10/31/2018 14:39 EST   PT Impairments or Limitations :   Pain limiting function, Range of motion deficits, Strength deficits   PATRICK, PT, [facility] - 10/31/2018 14:39 EST   Long Term Goals   Outpatient PT Long Term Goals Rehab     Long Term Goal 1  Long Term Goal 2        Goal :    Pt. will have full R shoulder PROM in all directions   Pt. will be able to chip/putt s R shoulder pain             PATRICK, PT, [facility] - 10/31/2018 14:39 EST  PATRICK, PT, [facility] - 10/31/2018 14:39 EST        Short Term Goals   Other PT Goals Grid     Goal #1  Goal #2  Goal #3  Goal #4    Goal :    Pt. will be Indep/compliant c HEP   Pt. will no longer require R shoulder sling   Pt. will return to sleeping in his bed s R shoulder pain   Increase R shoulder ROM by >20 deg. all directions       PATRICK, PT, [facility] - 10/31/2018 14:39 EST  PATRICK, PT, [facility] - 10/31/2018 14:39 EST  PATRICK, PT, [facility] - 10/31/2018 14:39 EST  PATRICK, PT, [facility] - 10/31/2018 14:39 EST       Signature Line                                                 Electronically Signed On 10/31/18 02:39 PM                                               _________________________________________________  PATRICK, PT, [facility]                                                     Electronically Signed On 11/01/18 10:17 AM                                               _________________________________________________                                                Janet Berlin                  Dicatation Date: 10/31/18 02:39 PM

## 2018-11-05 DIAGNOSIS — M25561 Pain in right knee: Secondary | ICD-10-CM | POA: Diagnosis not present

## 2018-11-26 DIAGNOSIS — M25561 Pain in right knee: Secondary | ICD-10-CM | POA: Diagnosis not present

## 2018-12-05 NOTE — Progress Notes (Signed)
Outpatient PT Certification Letter-Text       PT Outpatient Certification Letter Entered On:  12/05/2018 14:15 EST    Performed On:  12/05/2018 14:15 EST by Saralyn Pilar, PT, [place]               Physician Certification   Date of Injury or Surgery :   10/11/2018 EST   Ordering Physician Name :   Girard Cooter   Number of Visits This Interval :   67   Dear Physician :   Thank you for your referral. Below is the patient information for the stated interval of treatment. Please review, modify (if necessary), sign, and return. Thank you.   Date of Evaluation :   97/98/9211 EST   PT Certification Interval End :   01/28/2019 EDT   Physician Signature Required :   Yes   Staff Physician Signature :   The 4 electronic signature noted above indicates approval of the documented Plan of Care for the stated interval.   PATRICK, PT, [facility] - 12/05/2018 14:15 EST   Problem List   (As Of: 12/05/2018 14:15:20 EST)   Problems(Active)    Basal cell carcinoma (SNOMED CT  :941740814 )  Name of Problem:   Basal cell carcinoma ; Recorder:   DAVIS, RN, JOY M; Confirmation:   Confirmed ; Classification:   Patient Stated ; Code:   [id number] ; Contributor System:   Conservation officer, nature ; Last Updated:   05/05/2017 11:17 EDT ; Life Cycle Date:   05/05/2017 ; Life Cycle Status:   Active ; Vocabulary:   SNOMED CT        History of prostate cancer (SNOMED CT  :9702637858 )  Name of Problem:   History of prostate cancer ; Recorder:   [place], RN, Rande Brunt; Confirmation:   Confirmed ; Classification:   Patient Stated ; Code:   [id number] ; Contributor System:   Conservation officer, nature ; Last Updated:   04/07/2016 14:05 EDT ; Life Cycle Date:   04/07/2016 ; Life Cycle Status:   Active ; Vocabulary:   SNOMED CT        Torn rotator cuff (SNOMED CT  :7867672094 )  Name of Problem:   Torn rotator cuff ; Recorder:   DAVIS, RN, JOY M; Confirmation:   Confirmed ; Classification:   Patient Stated ; Code:   7096283662 ; Contributor System:   Conservation officer, nature ; Last Updated:    05/05/2017 11:17 EDT ; Life Cycle Date:   05/05/2017 ; Life Cycle Status:   Active ; Vocabulary:   SNOMED CT        Wears glasses (SNOMED CT  :947654650 )  Name of Problem:   Wears glasses ; Recorder:   LUTZ, RN, CARLA W; Confirmation:   Confirmed ; Classification:   Patient Stated ; Code:   [id number] ; Contributor System:   Conservation officer, nature ; Last Updated:   10/08/2018 14:12 EST ; Life Cycle Date:   10/08/2018 ; Life Cycle Status:   Active ; Vocabulary:   SNOMED CT          Diagnoses(Active)    Pain in right shoulder  Date:   10/31/2018 ; Diagnosis Type:   Other ; Confirmation:   Differential ; Clinical Dx:   Pain in right shoulder ; Classification:   Interdisciplinary ; Clinical Service:   Non-Specified ; Code:   ICD-10-CM ; Probability:   0 ; Diagnosis Code:   X51.700        Plan   PT Frequency Outpatient :  Other: 2x/week   PT Duration Outpatient :   90    PT Duration Unit Outpatient :   Days   PT Anticipated Treatments, Needs :   Electrical stimulation, Joint mobilization, Manual therapy, Posture/Body mechanics training, Patient education, Soft tissue massage/MFR, Therapeutic exercises, Vasopneunmatic cryocompression   PT Certification Letter Complete :   Yes   PATRICK, PT, [facility] - 12/05/2018 14:15 EST   Outpatient Review   *Clinical Assessment Summary :   Pt. has been present for 11 total PT visits where he has received PROM and AAROM including instruction in a HEP.  He has regained significant PROM in all directions except IR which has been avoided secondary to biceps tenodesis.  He has minimal pain at this time and is gradually beginning to use his R UE for light household activities.  He has made very good progress and will continue to benefit from PT intervention to restore optimal R UE mobility.  He will be out of town x 1 week and will follow up c PT when he returns.      Rehab Potential Physical Therapy :   Christena Deem, PT, [facility] - 12/05/2018 14:15 EST   Prior Functional Level Grid   ADL :   Independent    Mobility :   Independent   Instrumental ADL :   Independent   Cognitive-Communication Skills :   Independent   PATRICK, PT, [facility] - 12/05/2018 14:15 EST   PT Impairments or Limitations :   Pain limiting function, Range of motion deficits, Strength deficits   PATRICK, PT, [facility] - 12/05/2018 14:15 EST   Long Term Goals   Outpatient PT Long Term Goals Rehab     Long Term Goal 1  Long Term Goal 2        Goal :    Pt. will have full R shoulder PROM in all directions   Pt. will be able to chip/putt s R shoulder pain             PATRICK, PT, [facility] - 12/05/2018 14:15 EST  PATRICK, PT, [facility] - 12/05/2018 14:15 EST        Short Term Goals   Other PT Goals Grid     Goal #1  Goal #2  Goal #3  Goal #4    Goal :    Pt. will be Indep/compliant c HEP   Pt. will no longer require R shoulder sling   Pt. will return to sleeping in his bed s R shoulder pain   Increase R shoulder ROM by >20 deg. all directions     Status :    Goal met   Goal met   Progressing, continue   Goal met     Date Met :       11/29/2018 EST             PATRICK, PT, [facility] - 12/05/2018 14:15 EST  PATRICK, PT, [facility] - 12/05/2018 14:15 EST  PATRICK, PT, [facility] - 12/05/2018 14:15 EST  PATRICK, PT, [facility] - 12/05/2018 14:15 EST       Signature Line                                                 Electronically Signed On 12/05/18 02:15 PM  _________________________________________________                                               Clint Bolder, [facility]                        Dicatation Date: 12/05/18 02:15 PM

## 2018-12-13 DIAGNOSIS — R799 Abnormal finding of blood chemistry, unspecified: Secondary | ICD-10-CM | POA: Diagnosis not present

## 2018-12-13 DIAGNOSIS — E785 Hyperlipidemia, unspecified: Secondary | ICD-10-CM | POA: Diagnosis not present

## 2018-12-13 DIAGNOSIS — E039 Hypothyroidism, unspecified: Secondary | ICD-10-CM | POA: Diagnosis not present

## 2018-12-13 DIAGNOSIS — B20 Human immunodeficiency virus [HIV] disease: Secondary | ICD-10-CM | POA: Diagnosis not present

## 2018-12-17 DIAGNOSIS — M25561 Pain in right knee: Secondary | ICD-10-CM | POA: Diagnosis not present

## 2018-12-20 DIAGNOSIS — B20 Human immunodeficiency virus [HIV] disease: Secondary | ICD-10-CM | POA: Diagnosis not present

## 2018-12-20 DIAGNOSIS — E785 Hyperlipidemia, unspecified: Secondary | ICD-10-CM | POA: Diagnosis not present

## 2018-12-20 DIAGNOSIS — Z6827 Body mass index (BMI) 27.0-27.9, adult: Secondary | ICD-10-CM | POA: Diagnosis not present

## 2018-12-24 DIAGNOSIS — N5201 Erectile dysfunction due to arterial insufficiency: Secondary | ICD-10-CM | POA: Diagnosis not present

## 2018-12-28 LAB — TESTOSTERONE, FREE/TOT EQUILIB
Testosterone % Free: 2.6 % (ref 1.50–4.20)
Testosterone, Free: 13.68 ng/dL (ref 5.00–21.00)
Testosterone: 526 ng/dL (ref 264–916)

## 2019-01-07 DIAGNOSIS — M25561 Pain in right knee: Secondary | ICD-10-CM | POA: Diagnosis not present

## 2019-01-10 NOTE — Progress Notes (Signed)
Outpatient PT Certification Letter-Text       PT Outpatient Certification Letter Entered On:  01/10/2019 13:42 EST    Performed On:  01/10/2019 13:42 EST by Saralyn Pilar, PT, [place]               Physician Certification   Date of Injury or Surgery :   10/11/2018 EST   Ordering Physician Name :   Girard Cooter   Number of Visits This Interval :   64   Dear Physician :   Thank you for your referral. Below is the patient information for the stated interval of treatment. Please review, modify (if necessary), sign, and return. Thank you.   Date of Evaluation :   16/08/9603 EST   PT Certification Interval End :   01/28/2019 EDT   Physician Signature Required :   Yes   Staff Physician Signature :   The 26 electronic signature noted above indicates approval of the documented Plan of Care for the stated interval.   PATRICK, PT, [facility] - 01/10/2019 13:42 EST   Problem List   (As Of: 01/10/2019 13:42:43 EST)   Problems(Active)    Basal cell carcinoma (SNOMED CT  :540981191 )  Name of Problem:   Basal cell carcinoma ; Recorder:   DAVIS, RN, JOY M; Confirmation:   Confirmed ; Classification:   Patient Stated ; Code:   [id number] ; Contributor System:   Conservation officer, nature ; Last Updated:   05/05/2017 11:17 EDT ; Life Cycle Date:   05/05/2017 ; Life Cycle Status:   Active ; Vocabulary:   SNOMED CT        History of prostate cancer (SNOMED CT  :3086578469 )  Name of Problem:   History of prostate cancer ; Recorder:   [place], RN, Rande Brunt; Confirmation:   Confirmed ; Classification:   Patient Stated ; Code:   [id number] ; Contributor System:   Conservation officer, nature ; Last Updated:   04/07/2016 14:05 EDT ; Life Cycle Date:   04/07/2016 ; Life Cycle Status:   Active ; Vocabulary:   SNOMED CT        Torn rotator cuff (SNOMED CT  :4401027253 )  Name of Problem:   Torn rotator cuff ; Recorder:   DAVIS, RN, JOY M; Confirmation:   Confirmed ; Classification:   Patient Stated ; Code:   [id number] ; Contributor System:   Conservation officer, nature ; Last Updated:    05/05/2017 11:17 EDT ; Life Cycle Date:   05/05/2017 ; Life Cycle Status:   Active ; Vocabulary:   SNOMED CT        Wears glasses (SNOMED CT  :595638756 )  Name of Problem:   Wears glasses ; Recorder:   LUTZ, RN, CARLA W; Confirmation:   Confirmed ; Classification:   Patient Stated ; Code:   [id number] ; Contributor System:   Conservation officer, nature ; Last Updated:   10/08/2018 14:12 EST ; Life Cycle Date:   10/08/2018 ; Life Cycle Status:   Active ; Vocabulary:   SNOMED CT          Diagnoses(Active)    Pain in right shoulder  Date:   10/31/2018 ; Diagnosis Type:   Other ; Confirmation:   Differential ; Clinical Dx:   Pain in right shoulder ; Classification:   Interdisciplinary ; Clinical Service:   Non-Specified ; Code:   ICD-10-CM ; Probability:   0 ; Diagnosis Code:   C16.606        Plan   PT Frequency Outpatient :  Other: 2x/week   PT Duration Outpatient :   90    PT Duration Unit Outpatient :   Days   PT Anticipated Treatments, Needs :   Electrical stimulation, Joint mobilization, Manual therapy, Posture/Body mechanics training, Patient education, Soft tissue massage/MFR, Therapeutic exercises, Vasopneunmatic cryocompression   PT Certification Letter Complete :   Yes   PATRICK, PT, [place] - 01/10/2019 13:42 EST   Outpatient Review   *Clinical Assessment Summary :   Pt. has been present for 20 total PT visits where he has received progressive R shoulder PROM > AAROM > AROM > light strengthening c instruction in HEP.  He has regained virtually all of his PROM but continues to have significant weakness in his L rotator cuff musculature leading to scapular hiking c all attempts at abduction of overhead reaching.  He requires verbal/manual assistance for all exercises to maintain proper technique and manage appropriate increases in intensity/load.  Pt. will continue to benefit from PT intervention.      Rehab Potential Physical Therapy :   Good   PATRICK, PT, [place] - 01/10/2019 13:42 EST   Prior Functional Level Grid   ADL :    Independent   Mobility :   Independent   Instrumental ADL :   Independent   Cognitive-Communication Skills :   Independent   PATRICK, PT, [place] - 01/10/2019 13:42 EST   PT Impairments or Limitations :   Pain limiting function, Strength deficits   PATRICK, PT, [place] - 01/10/2019 13:42 EST   Long Term Goals   Outpatient PT Long Term Goals Rehab     Long Term Goal 1  Long Term Goal 2        Goal :    Pt. will have full R shoulder PROM in all directions   Pt. will be able to chip/putt s R shoulder pain           Status :    Goal met   Progressing, continue             PATRICK, PT, [place] - 01/10/2019 13:42 EST  PATRICK, PT, [place] - 01/10/2019 13:42 EST        Short Term Goals   Other PT Goals Grid     Goal #1  Goal #2  Goal #3  Goal #4    Goal :    Pt. will be Indep/compliant c HEP   Pt. will no longer require R shoulder sling   Pt. will return to sleeping in his bed s R shoulder pain   Increase R shoulder ROM by >20 deg. all directions     Status :    Goal met   Goal met   Goal met   Goal met     Date Met :       11/29/2018 EST             PATRICK, PT, [place] - 01/10/2019 13:42 EST  PATRICK, PT, [place] - 01/10/2019 13:42 EST  PATRICK, PT, [place] - 01/10/2019 13:42 EST  PATRICK, PT, [place] - 01/10/2019 13:42 EST       Signature Line                                                 Electronically Signed On 01/10/19 01:42 PM  _________________________________________________                                               Clint Bolder, [facility]                        Dicatation Date: 01/10/19 01:42 PM

## 2019-01-28 DIAGNOSIS — M25561 Pain in right knee: Secondary | ICD-10-CM | POA: Diagnosis not present

## 2019-01-29 NOTE — Progress Notes (Signed)
Outpatient PT Certification Letter-Text       PT Outpatient Certification Letter Entered On:  01/29/2019 15:53 EDT    Performed On:  01/29/2019 15:53 EDT by Saralyn Pilar PT, [place]               Physician Certification   Date of Injury or Surgery :   10/11/2018 EST   Ordering Physician Name :   Girard Cooter   Number of Visits This Interval :   1   Dear Physician :   Thank you for your referral. Below is the patient information for the stated interval of treatment. Please review, modify (if necessary), sign, and return. Thank you.   Date of Evaluation :   1/32/4401 EDT   PT Certification Interval End :   04/29/2019 EDT   Physician Signature Required :   Yes   Staff Physician Signature :   The 29 electronic signature noted above indicates approval of the documented Plan of Care for the stated interval.   PATRICK, PT, [place] - 01/29/2019 15:53 EDT   Problem List   (As Of: 01/29/2019 15:53:22 EDT)   Problems(Active)    Basal cell carcinoma (SNOMED CT  :027253664 )  Name of Problem:   Basal cell carcinoma ; Recorder:   DAVIS, RN, JOY M; Confirmation:   Confirmed ; Classification:   Patient Stated ; Code:   [id number] ; Contributor System:   Conservation officer, nature ; Last Updated:   05/05/2017 11:17 EDT ; Life Cycle Date:   05/05/2017 ; Life Cycle Status:   Active ; Vocabulary:   SNOMED CT        History of prostate cancer (SNOMED CT  :5638756433 )  Name of Problem:   History of prostate cancer ; Recorder:   [place], RN, Rande Brunt; Confirmation:   Confirmed ; Classification:   Patient Stated ; Code:   [id number] ; Contributor System:   Conservation officer, nature ; Last Updated:   04/07/2016 14:05 EDT ; Life Cycle Date:   04/07/2016 ; Life Cycle Status:   Active ; Vocabulary:   SNOMED CT        Torn rotator cuff (SNOMED CT  :0630160109 )  Name of Problem:   Torn rotator cuff ; Recorder:   DAVIS, RN, JOY M; Confirmation:   Confirmed ; Classification:   Patient Stated ; Code:   [id number] ; Contributor System:   Conservation officer, nature ; Last Updated:    05/05/2017 11:17 EDT ; Life Cycle Date:   05/05/2017 ; Life Cycle Status:   Active ; Vocabulary:   SNOMED CT        Wears glasses (SNOMED CT  :254270623 )  Name of Problem:   Wears glasses ; Recorder:   LUTZ, RN, CARLA W; Confirmation:   Confirmed ; Classification:   Patient Stated ; Code:   [id number] ; Contributor System:   Conservation officer, nature ; Last Updated:   10/08/2018 14:12 EST ; Life Cycle Date:   10/08/2018 ; Life Cycle Status:   Active ; Vocabulary:   SNOMED CT          Diagnoses(Active)    Pain in right shoulder  Date:   01/29/2019 ; Diagnosis Type:   Other ; Confirmation:   Differential ; Clinical Dx:   Pain in right shoulder ; Classification:   Interdisciplinary ; Clinical Service:   Non-Specified ; Code:   ICD-10-CM ; Probability:   0 ; Diagnosis Code:   O16.073        Plan   PT Frequency Outpatient :  Other: 2x/week   PT Duration Outpatient :   90    PT Duration Unit Outpatient :   Days   PT Anticipated Treatments, Needs :   Joint mobilization, Manual therapy, Moist heat/ice, Patient education, Soft tissue massage/MFR, Therapeutic exercises, Vasopneunmatic cryocompression   PT Certification Letter Complete :   Yes   PATRICK, PT, [place] - 01/29/2019 15:53 EDT   Outpatient Review   *Clinical Assessment Summary :   Pt. is a 73 y/o male s/p R RC repair c biceps tenodesis on 10/11/18.  Pt. presents to PT c mild PROM deficits as well as limited active overhead reaching c scapular hiking present.  He has restricted R GHJ capsular mobility in all directions as well as decreased rotator cuff musculature which limits his ability to perform functional tasks at home.  He has started to chip and putt in preparation to return to playing golf in the future when cleared by his MD.  Pt. will continue to benefit from PT Intervention to restore optimal R UE functional mobility.      Rehab Potential Physical Therapy :   Christena Deem, PT, [place] - 01/29/2019 15:53 EDT   Prior Functional Level Grid   ADL :   Independent   Mobility :    Independent   Instrumental ADL :   Independent   Cognitive-Communication Skills :   Independent   PATRICK, PT, [place] - 01/29/2019 15:53 EDT   PT Impairments or Limitations :   Pain limiting function, Range of motion deficits, Strength deficits   PATRICK, PT, [place] - 01/29/2019 15:53 EDT   Long Term Goals   Outpatient PT Long Term Goals Rehab     Long Term Goal 1  Long Term Goal 2        Goal :    Full return to golf s R shoulder pain   Pt. will sleep all night s R shoulder pain             PATRICK, PT, [place] - 01/29/2019 15:53 EDT  PATRICK, PT, [place] - 01/29/2019 15:53 EDT        Short Term Goals   Other PT Goals Grid     Goal #1  Goal #2  Goal #3      Goal :    Pt. will be Indep/compliant c HEP   Increase R shoulder PROM by > 5 deg. all directions   Pt. will lift 2 lbs. overhead s scapular hiking.          PATRICK, PT, [place] - 01/29/2019 15:53 EDT  PATRICK, PT, [place] - 01/29/2019 15:53 EDT  PATRICK, PT, [place] - 01/29/2019 15:53 EDT        Signature Line                                                 Electronically Signed On 01/29/19 03:53 PM                                               _________________________________________________  Saralyn Pilar, PT, [facility]                                                     Electronically Signed On 01/30/19 07:55 PM                                               _________________________________________________                                                Girard Cooter                  Dicatation Date: 01/29/19 03:53 PM

## 2019-03-16 NOTE — Progress Notes (Signed)
Outpatient PT Certification Letter-Text       PT Outpatient Certification Letter Entered On:  04/02/2019 11:10 EDT    Performed On:  04/02/2019 11:10 EDT by Clint Bolder, [place]               Physician Certification   Date of Injury or Surgery :   10/11/2018 EST   Ordering Physician Name :   Girard Cooter   Number of Visits This Interval :   2   Dear Physician :   Thank you for your referral. Below is the patient information for the stated interval of treatment. Please review, modify (if necessary), sign, and return. Thank you.   Date of Evaluation :   4/78/2956 EDT   PT Certification Interval End :   04/29/2019 EDT   Physician Signature Required :   Yes   Staff Physician Signature :   The 74 electronic signature noted above indicates approval of the documented Plan of Care for the stated interval.   PATRICK, PT, [place] - 04/02/2019 11:10 EDT   Problem List   (As Of: 04/02/2019 11:10:28 EDT)   Problems(Active)    Basal cell carcinoma (SNOMED CT  :213086578 )  Name of Problem:   Basal cell carcinoma ; Recorder:   DAVIS, RN, JOY M; Confirmation:   Confirmed ; Classification:   Patient Stated ; Code:   [id number] ; Contributor System:   Conservation officer, nature ; Last Updated:   05/05/2017 11:17 EDT ; Life Cycle Date:   05/05/2017 ; Life Cycle Status:   Active ; Vocabulary:   SNOMED CT        History of prostate cancer (SNOMED CT  :4132440102 )  Name of Problem:   History of prostate cancer ; Recorder:   [place], RN, Rande Brunt; Confirmation:   Confirmed ; Classification:   Patient Stated ; Code:   7253664403 ; Contributor System:   Conservation officer, nature ; Last Updated:   04/07/2016 14:05 EDT ; Life Cycle Date:   04/07/2016 ; Life Cycle Status:   Active ; Vocabulary:   SNOMED CT        Torn rotator cuff (SNOMED CT  :4742595638 )  Name of Problem:   Torn rotator cuff ; Recorder:   DAVIS, RN, JOY M; Confirmation:   Confirmed ; Classification:   Patient Stated ; Code:   7564332951 ; Contributor System:   Conservation officer, nature ; Last Updated:    05/05/2017 11:17 EDT ; Life Cycle Date:   05/05/2017 ; Life Cycle Status:   Active ; Vocabulary:   SNOMED CT        Wears glasses (SNOMED CT  :884166063 )  Name of Problem:   Wears glasses ; Recorder:   LUTZ, RN, CARLA W; Confirmation:   Confirmed ; Classification:   Patient Stated ; Code:   016010932 ; Contributor System:   Conservation officer, nature ; Last Updated:   10/08/2018 14:12 EST ; Life Cycle Date:   10/08/2018 ; Life Cycle Status:   Active ; Vocabulary:   SNOMED CT          Diagnoses(Active)    Pain in right shoulder  Date:   01/29/2019 ; Diagnosis Type:   Other ; Confirmation:   Differential ; Clinical Dx:   Pain in right shoulder ; Classification:   Interdisciplinary ; Clinical Service:   Non-Specified ; Code:   ICD-10-CM ; Probability:   0 ; Diagnosis Code:   T55.732        Plan   PT Frequency Outpatient :  Other: 2x/week   PT Duration Outpatient :   90    PT Duration Unit Outpatient :   Days   PT Anticipated Treatments, Needs :   Joint mobilization, Manual therapy, Moist heat/ice, Patient education, Soft tissue massage/MFR, Therapeutic exercises, Vasopneunmatic cryocompression   PT Certification Letter Complete :   Yes   PATRICK, PT, [place] - 04/02/2019 11:10 EDT   Outpatient Review   *Clinical Assessment Summary :   Pt. is s/p R RC repair c biceps tenodesis on 10/11/18.  Pt. was treated c progressive PROM> AAROM > AROM > light strengthening c instruction in a HEP.  His PT was interrupted by the COVID-19 outbreak and he elected to stop face to face visits.  He will continue HEP as directed and will follow up c MD/PT as needed after the Stay at home order is lifted.       Rehab Potential Physical Therapy :   Christena Deem, PT, [place] - 04/02/2019 11:10 EDT   Prior Functional Level Grid   ADL :   Independent   Mobility :   Independent   Instrumental ADL :   Independent   Cognitive-Communication Skills :   Independent   PATRICK, PT, [place] - 04/02/2019 11:10 EDT   PT Impairments or Limitations :   Pain limiting function, Range of  motion deficits, Strength deficits   PATRICK, PT, [place] - 04/02/2019 11:10 EDT   Long Term Goals   Outpatient PT Long Term Goals Rehab     Long Term Goal 1  Long Term Goal 2        Goal :    Full return to golf s R shoulder pain   Pt. will sleep all night s R shoulder pain           Status :    Discontinue   Discontinue             PATRICK, PT, [place] - 04/02/2019 11:10 EDT  PATRICK, PT, [place] - 04/02/2019 11:10 EDT        Short Term Goals   Other PT Goals Grid     Goal #1  Goal #2  Goal #3      Goal :    Pt. will be Indep/compliant c HEP   Increase R shoulder PROM by > 5 deg. all directions   Pt. will lift 2 lbs. overhead s scapular hiking.        Status :    [place], PT, [facility] - 04/02/2019 11:10 EDT  PATRICK, PT, [place] - 04/02/2019 11:10 EDT  PATRICK, PT, [place] - 04/02/2019 11:10 EDT        Signature Line                                                 Electronically Signed On 04/02/19 11:10 AM                                               _________________________________________________  PATRICK, PT, [place]                        Dicatation Date: 04/02/19 11:10 AM

## 2019-04-17 LAB — COMPREHENSIVE METABOLIC PANEL
ALT: 19 U/L (ref 0–41)
AST: 15 U/L (ref 0–40)
Albumin/Globulin Ratio: 1.8 mmol/L (ref 1.00–2.00)
Albumin: 4.3 g/dL (ref 3.5–5.2)
Alk Phosphatase: 68 U/L (ref 40–130)
Anion Gap: 10 mmol/L (ref 2–17)
BUN: 18 mg/dL (ref 8–23)
CO2: 28 mmol/L (ref 22–29)
Calcium: 9.1 mg/dL (ref 8.8–10.2)
Chloride: 101 mmol/L (ref 98–107)
Creatinine: 1 mg/dL (ref 0.7–1.3)
GFR African American: 88 mL/min/{1.73_m2} — ABNORMAL LOW (ref >=90)
GFR Non-African American: 76 mL/min/{1.73_m2} — ABNORMAL LOW (ref >=90)
Globulin: 2 g/dL (ref 1.9–4.4)
Glucose: 86 mg/dL (ref 70–99)
OSMOLALITY CALCULATED: 279 mOsm/kg (ref 270–287)
Potassium: 4.2 mmol/L (ref 3.5–5.3)
Sodium: 139 mmol/L (ref 135–145)
Total Bilirubin: 0.3 mg/dL (ref 0.00–1.20)
Total Protein: 6.7 g/dL (ref 6.4–8.3)

## 2019-04-17 LAB — CBC WITH AUTO DIFFERENTIAL
Absolute Baso #: 0 10*3/uL (ref 0.0–0.2)
Absolute Eos #: 0.2 10*3/uL (ref 0.0–0.5)
Absolute Lymph #: 1.7 10*3/uL (ref 1.0–3.2)
Absolute Mono #: 0.6 10*3/uL (ref 0.3–1.0)
Basophils %: 0.4 % (ref 0.0–2.0)
Eosinophils %: 2.4 % (ref 0.0–7.0)
Hematocrit: 48.7 % (ref 38.0–52.0)
Hemoglobin: 16.7 g/dL (ref 12.0–17.3)
Immature Grans (Abs): 0 10*3/uL
Immature Granulocytes: 0.4 %
Lymphocytes: 22.8 % (ref 15.0–45.0)
MCH: 31.1 pg (ref 27.0–34.5)
MCHC: 34.3 g/dL (ref 32.0–36.0)
MCV: 90.7 fL (ref 84.0–100.0)
MPV: 10.5 fL (ref 7.2–13.2)
Monocytes: 7.3 % (ref 4.0–12.0)
NRBC Absolute: 0 10*3/uL
NRBC Automated: 0 %
Neutrophils %: 66.7 % (ref 42.0–74.0)
Neutrophils Absolute: 5.1 10*3/uL (ref 1.6–7.3)
Platelets: 290 10*3/uL (ref 140–440)
RBC: 5.37 x10e6/mcL — ABNORMAL HIGH (ref 4.00–5.20)
RDW: 12.6 % (ref 11.0–16.0)
WBC: 7.6 10*3/uL (ref 3.8–10.6)

## 2019-04-17 LAB — LIPID PANEL
Chol/HDL Ratio: 3.7 (ref 0.0–4.4)
Cholesterol: 174 mg/dL (ref 100–200)
HDL: 47 mg/dL — ABNORMAL LOW (ref 55–72)
LDL Cholesterol: 91 mg/dL (ref 0.0–100.0)
LDL/HDL Ratio: 1.9
Triglycerides: 182 mg/dL — ABNORMAL HIGH (ref 0–149)
VLDL: 36.4 mg/dL (ref 5.0–40.0)

## 2019-07-01 ENCOUNTER — Ambulatory Visit: Payer: Self-pay

## 2019-07-01 NOTE — Telephone Encounter (Signed)
Yesterday pt cleaned his ears to remove wax and afterwards the hearing ot of his left ear is muffled. He is having no pain or dizziness. Pt stated that he did have a large amount of wax to come out. Pt would like appt with PCP to see inside his ear. Sending note to office.   Answer Assessment - Initial Assessment Questions 1. DESCRIPTION: "What type of hearing problem are you having? Describe it for me." (e.g., complete hearing loss, partial loss)     Partial loss 2. LOCATION: "One or both ears?" If one, ask: "Which ear?"     Left ear 3. SEVERITY: "Can you hear anything?" If so, ask: "What can you hear?" (e.g., ticking watch, whisper, talking)     talking 4. ONSET: "When did this begin?" "Did it start suddenly or come on gradually?"     yesterday  5. PATTERN: "Does this come and go, or has it been constant since it started?"     constant 6. PAIN: "Is there any pain in your ear(s)?"  (Scale 1-10; or mild, moderate, severe)    No  7. CAUSE: "What do you think is causing this hearing problem?"      8. OTHER SYMPTOMS: "Do you have any other symptoms?" (e.g., dizziness, ringing in ears)     *No Answer* 9. PREGNANCY: "Is there any chance you are pregnant?" "When was your last menstrual period?"     *No Answer*  Protocols used: HEARING LOSS OR CHANGE-A-AH

## 2019-07-02 ENCOUNTER — Ambulatory Visit (INDEPENDENT_AMBULATORY_CARE_PROVIDER_SITE_OTHER): Payer: Medicare Other | Admitting: Internal Medicine

## 2019-07-02 ENCOUNTER — Encounter: Payer: Self-pay | Admitting: Internal Medicine

## 2019-07-02 ENCOUNTER — Other Ambulatory Visit (INDEPENDENT_AMBULATORY_CARE_PROVIDER_SITE_OTHER): Payer: Medicare Other

## 2019-07-02 ENCOUNTER — Other Ambulatory Visit: Payer: Self-pay

## 2019-07-02 VITALS — BP 120/70 | HR 64 | Temp 97.8°F | Resp 16 | Ht 74.0 in | Wt 199.8 lb

## 2019-07-02 DIAGNOSIS — H5203 Hypermetropia, bilateral: Secondary | ICD-10-CM | POA: Diagnosis not present

## 2019-07-02 DIAGNOSIS — H10413 Chronic giant papillary conjunctivitis, bilateral: Secondary | ICD-10-CM | POA: Diagnosis not present

## 2019-07-02 DIAGNOSIS — E039 Hypothyroidism, unspecified: Secondary | ICD-10-CM

## 2019-07-02 DIAGNOSIS — H52203 Unspecified astigmatism, bilateral: Secondary | ICD-10-CM | POA: Diagnosis not present

## 2019-07-02 DIAGNOSIS — H353131 Nonexudative age-related macular degeneration, bilateral, early dry stage: Secondary | ICD-10-CM | POA: Diagnosis not present

## 2019-07-02 DIAGNOSIS — E785 Hyperlipidemia, unspecified: Secondary | ICD-10-CM

## 2019-07-02 DIAGNOSIS — H6123 Impacted cerumen, bilateral: Secondary | ICD-10-CM | POA: Diagnosis not present

## 2019-07-02 NOTE — Telephone Encounter (Signed)
Appointment has been made for today @ 3:20pm

## 2019-07-02 NOTE — Progress Notes (Signed)
Subjective:  Patient ID: Zachary Levy, male    DOB: [birth year]  Age: 73 y.o. MRN: [id number]  CC: Hypothyroidism   HPI Zachary Levy presents for he complains of a several month history of decreased level of hearing on both sides.  He complains of weight gain but otherwise feels well and offers no other complaints today.  Outpatient Medications Prior to Visit  Medication Sig Dispense Refill  . Emtricitab-Rilpivir-Tenofov AF (ODEFSEY PO) Take by mouth.    . raltegravir (ISENTRESS) 400 MG tablet Take 400 mg by mouth 2 (two) times daily.    Marland Kitchen levothyroxine (SYNTHROID, LEVOTHROID) 150 MCG tablet Take 1 tablet (150 mcg total) by mouth daily before breakfast. 90 tablet 0  . meloxicam (MOBIC) 15 MG tablet Take 1 tablet (15 mg total) by mouth daily. 30 tablet 1   No facility-administered medications prior to visit.     ROS Review of Systems  Constitutional: Positive for unexpected weight change (wt gain).  HENT: Positive for hearing loss. Negative for ear pain, sinus pressure, sneezing and sore throat.   Eyes: Negative.   Respiratory: Negative.  Negative for cough, chest tightness, shortness of breath and wheezing.   Cardiovascular: Negative for chest pain, palpitations and leg swelling.  Gastrointestinal: Negative.  Negative for abdominal pain, constipation, diarrhea, nausea and vomiting.  Endocrine: Negative for cold intolerance and heat intolerance.  Genitourinary: Negative.  Negative for difficulty urinating and hematuria.  Musculoskeletal: Negative.  Negative for arthralgias and myalgias.  Skin: Negative.  Negative for color change and pallor.  Neurological: Negative.  Negative for dizziness and light-headedness.  Hematological: Negative for adenopathy. Does not bruise/bleed easily.  Psychiatric/Behavioral: Negative.     Objective:  BP 120/70 (BP Location: Left Arm, Patient Position: Sitting, Cuff Size: Normal)   Pulse 64   Temp 97.8 F (36.6 C) (Oral)   Resp 16   Ht 6\' 2"   (1.88 m)   Wt 199 lb 12 oz (90.6 kg)   SpO2 97%   BMI 25.65 kg/m   BP Readings from Last 3 Encounters:  07/02/19 120/70  08/30/18 112/80  08/13/18 121/74    Wt Readings from Last 3 Encounters:  07/02/19 199 lb 12 oz (90.6 kg)  08/30/18 206 lb (93.4 kg)  08/13/18 202 lb (91.6 kg)    Physical Exam Vitals signs reviewed.  Constitutional:      Appearance: He is not ill-appearing or diaphoretic.  HENT:     Right Ear: External ear normal. Decreased hearing noted. No drainage or swelling. There is impacted cerumen. Tympanic membrane is not erythematous.     Left Ear: External ear normal. Decreased hearing noted. No drainage or swelling. There is impacted cerumen. Tympanic membrane is not erythematous.     Ears:     Comments: The cerumen was removed with suction.  The exam afterward shows that the [facility] is normal.  He tells me his hearing has returned to normal.    Nose: Nose normal. No rhinorrhea.     Mouth/Throat:     Pharynx: Oropharynx is clear.  Eyes:     General: No scleral icterus.    Conjunctiva/sclera: Conjunctivae normal.  Neck:     Musculoskeletal: Normal range of motion and neck supple. No neck rigidity.  Cardiovascular:     Rate and Rhythm: Normal rate and regular rhythm.     Heart sounds: No murmur.  Pulmonary:     Effort: Pulmonary effort is normal. No respiratory distress.     Breath sounds: No stridor. No wheezing,  rhonchi or rales.  Abdominal:     General: Abdomen is flat.     Palpations: There is no mass.     Tenderness: There is no abdominal tenderness. There is no guarding.  Musculoskeletal: Normal range of motion.     Right lower leg: No edema.     Left lower leg: No edema.  Skin:    General: Skin is warm and dry.     Coloration: Skin is not pale.  Neurological:     General: No focal deficit present.  Psychiatric:        Mood and Affect: Mood normal.        Behavior: Behavior normal.     Lab Results  Component Value Date   WBC 7.2 08/30/2018    HGB 15.6 08/30/2018   HCT 45.8 08/30/2018   PLT 186.0 08/30/2018   GLUCOSE 96 08/30/2018   CHOL 193 08/30/2018   TRIG 96.0 08/30/2018   HDL 71.30 08/30/2018   LDLCALC 102 (H) 08/30/2018   ALT 24 08/30/2018   AST 21 08/30/2018   NA 138 08/30/2018   K 4.0 08/30/2018   CL 103 08/30/2018   CREATININE 1.00 08/30/2018   BUN 28 (H) 08/30/2018   CO2 28 08/30/2018   TSH 0.33 (L) 07/02/2019   PSA 2.9 08/14/2018   INR 1.0 02/12/2007    Dg Cervical Spine Complete  Result Date: 04/09/2013 *RADIOLOGY REPORT* Clinical Data: Neck and arm pain, no trauma CERVICAL SPINE - COMPLETE 4+ VIEW Comparison: None. Findings: The cervical vertebrae are in normal alignment.  There is degenerative disc disease at C5-6 and C6-7 with some loss of disc space, sclerosis, and spurring.  No prevertebral soft tissue swelling is seen.  There is mild foraminal narrowing at C5-6 with no significant narrowing at C6-7.  The odontoid process is intact. The lung apices are clear. IMPRESSION: Degenerative disc disease at C5-6 and C6-7 with mild foraminal narrowing at C5-6. Original Report Authenticated By: Ivar Drape, M.D.   Dg Knee 1-2 Views Left  Result Date: 04/09/2013 *RADIOLOGY REPORT* Clinical Data: Knee pain, no trauma LEFT KNEE - 1-2 VIEW Comparison: None. Findings: There may be very minimal decrease in medial joint space. No significant degenerative spurring is seen although there is a tiny spur emanating from the superior aspect of the patella.  No fracture is seen and no effusion is noted. IMPRESSION: Minimal degenerative change with some loss of medial joint space and minimal patellar spurring. Original Report Authenticated By: Ivar Drape, M.D.   Dg Knee 1-2 Views Right  Result Date: 04/09/2013 *RADIOLOGY REPORT* Clinical Data: Knee pain, no trauma RIGHT KNEE - 1-2 VIEW Comparison: None. Findings: Very minimal degenerative spurring is noted at the patellofemoral articulation.  No significant degenerative change is  seen.  The joint spaces are relatively well preserved for age.  No effusion is noted. IMPRESSION: Minimal patellar spurring.  No significant degenerative change. Original Report Authenticated By: Ivar Drape, M.D.    Patient's verbal consent obtained prior to lighted suction removal.  I used the Bionix lighted suction to remove the cerumen.  The cerumen was removed and a full view of tympanic membrane was possible after the procedure.  Patient tolerated procedure well.   Assessment & Plan:   Darl was seen today for hypothyroidism.  Diagnoses and all orders for this visit:  Adult hypothyroidism- His TSH is suppressed so I recommended that he decrease his dose of levothyroxine. -     TSH; Future -     Discontinue:  levothyroxine (SYNTHROID) 137 MCG tablet; Take 1 tablet (137 mcg total) by mouth daily before breakfast.  Hearing loss of both ears due to cerumen impaction- His hearing has returned to normal since the cerumen has been extracted.   I have discontinued Jeneen Rinks Sterkel's meloxicam and levothyroxine. I am also having him maintain his raltegravir and Emtricitab-Rilpivir-Tenofov AF (ODEFSEY PO).  Meds ordered this encounter  Medications  . DISCONTD: levothyroxine (SYNTHROID) 137 MCG tablet    Sig: Take 1 tablet (137 mcg total) by mouth daily before breakfast.    Dispense:  90 tablet    Refill:  1     Follow-up: Return in about 3 months (around 10/02/2019).  Scarlette Calico, MD

## 2019-07-02 NOTE — Progress Notes (Signed)
Patient consent obtained. Irrigation with water and peroxide performed. Full view of tympanic membranes after procedure.  Patient tolerated procedure well.   

## 2019-07-02 NOTE — Patient Instructions (Signed)
Hypothyroidism  Hypothyroidism is when the thyroid gland does not make enough of certain hormones (it is underactive). The thyroid gland is a small gland located in the lower front part of the neck, just in front of the windpipe (trachea). This gland makes hormones that help control how the body uses food for energy (metabolism) as well as how the heart and brain function. These hormones also play a role in keeping your bones strong. When the thyroid is underactive, it produces too little of the hormones thyroxine (T4) and triiodothyronine (T3). What are the causes? This condition may be caused by:  Hashimoto's disease. This is a disease in which the body's disease-fighting system (immune system) attacks the thyroid gland. This is the most common cause.  Viral infections.  Pregnancy.  Certain medicines.  Birth defects.  Past radiation treatments to the head or neck for cancer.  Past treatment with radioactive iodine.  Past exposure to radiation in the environment.  Past surgical removal of part or all of the thyroid.  Problems with a gland in the center of the brain (pituitary gland).  Lack of enough iodine in the diet. What increases the risk? You are more likely to develop this condition if:  You are male.  You have a family history of thyroid conditions.  You use a medicine called lithium.  You take medicines that affect the immune system (immunosuppressants). What are the signs or symptoms? Symptoms of this condition include:  Feeling as though you have no energy (lethargy).  Not being able to tolerate cold.  Weight gain that is not explained by a change in diet or exercise habits.  Lack of appetite.  Dry skin.  Coarse hair.  Menstrual irregularity.  Slowing of thought processes.  Constipation.  Sadness or depression. How is this diagnosed? This condition may be diagnosed based on:  Your symptoms, your medical history, and a physical exam.  Blood  tests. You may also have imaging tests, such as an ultrasound or MRI. How is this treated? This condition is treated with medicine that replaces the thyroid hormones that your body does not make. After you begin treatment, it may take several weeks for symptoms to go away. Follow these instructions at home:  Take over-the-counter and prescription medicines only as told by your health care provider.  If you start taking any new medicines, tell your health care provider.  Keep all follow-up visits as told by your health care provider. This is important. ? As your condition improves, your dosage of thyroid hormone medicine may change. ? You will need to have blood tests regularly so that your health care provider can monitor your condition. Contact a health care provider if:  Your symptoms do not get better with treatment.  You are taking thyroid replacement medicine and you: ? Sweat a lot. ? Have tremors. ? Feel anxious. ? Lose weight rapidly. ? Cannot tolerate heat. ? Have emotional swings. ? Have diarrhea. ? Feel weak. Get help right away if you have:  Chest pain.  An irregular heartbeat.  A rapid heartbeat.  Difficulty breathing. Summary  Hypothyroidism is when the thyroid gland does not make enough of certain hormones (it is underactive).  When the thyroid is underactive, it produces too little of the hormones thyroxine (T4) and triiodothyronine (T3).  The most common cause is Hashimoto's disease, a disease in which the body's disease-fighting system (immune system) attacks the thyroid gland. The condition can also be caused by viral infections, medicine, pregnancy, or past   radiation treatment to the head or neck.  Symptoms may include weight gain, dry skin, constipation, feeling as though you do not have energy, and not being able to tolerate cold.  This condition is treated with medicine to replace the thyroid hormones that your body does not make. This information  is not intended to replace advice given to you by your health care provider. Make sure you discuss any questions you have with your health care provider. Document Released: 11/07/2005 Document Revised: 10/20/2017 Document Reviewed: 10/18/2017 Elsevier Patient Education  2020 [employer] Inc.  

## 2019-07-03 ENCOUNTER — Encounter: Payer: Self-pay | Admitting: Internal Medicine

## 2019-07-03 DIAGNOSIS — E039 Hypothyroidism, unspecified: Secondary | ICD-10-CM

## 2019-07-03 LAB — TSH: TSH: 0.33 u[IU]/mL — ABNORMAL LOW (ref 0.35–4.50)

## 2019-07-03 MED ORDER — LEVOTHYROXINE SODIUM 137 MCG PO TABS: 137.0000 ug | ORAL_TABLET | Freq: Every day | ORAL | 1 refills | Status: DC

## 2019-07-04 ENCOUNTER — Encounter: Payer: Self-pay | Admitting: Internal Medicine

## 2019-07-04 MED ORDER — LEVOTHYROXINE SODIUM 137 MCG PO TABS: 137.0000 ug | ORAL_TABLET | Freq: Every day | ORAL | 1 refills | Status: DC

## 2019-07-12 LAB — CBC
Hematocrit: 49.6 % (ref 38.0–52.0)
Hemoglobin: 16.9 g/dL (ref 13.0–17.3)
MCH: 30.7 pg (ref 27.0–34.5)
MCHC: 34.1 g/dL (ref 32.0–36.0)
MCV: 90.2 fL (ref 84.0–100.0)
MPV: 10.2 fL (ref 7.2–13.2)
NRBC Absolute: 0 10*3/uL (ref 0.000–0.012)
NRBC Automated: 0 % (ref 0.0–0.2)
Platelets: 265 10*3/uL (ref 140–440)
RBC: 5.5 x10e6/mcL (ref 4.00–5.60)
RDW: 12.5 % (ref 11.0–16.0)
WBC: 6.5 10*3/uL (ref 3.8–10.6)

## 2019-07-12 LAB — PROSTATE SPECIFIC ANTIGEN, TOTAL: PSA: 0.021 ng/mL (ref 0.000–4.000)

## 2019-07-15 LAB — TESTOSTERONE, FREE/TOT EQUILIB: Testosterone: 250 ng/dL — ABNORMAL LOW (ref 264–916)

## 2019-07-18 ENCOUNTER — Other Ambulatory Visit: Payer: Self-pay | Admitting: Internal Medicine

## 2019-07-18 DIAGNOSIS — N400 Enlarged prostate without lower urinary tract symptoms: Secondary | ICD-10-CM

## 2019-07-18 DIAGNOSIS — E039 Hypothyroidism, unspecified: Secondary | ICD-10-CM

## 2019-07-18 DIAGNOSIS — E785 Hyperlipidemia, unspecified: Secondary | ICD-10-CM

## 2019-07-25 ENCOUNTER — Other Ambulatory Visit: Payer: Self-pay | Admitting: Internal Medicine

## 2019-07-25 DIAGNOSIS — R739 Hyperglycemia, unspecified: Secondary | ICD-10-CM | POA: Insufficient documentation

## 2019-07-25 DIAGNOSIS — E785 Hyperlipidemia, unspecified: Secondary | ICD-10-CM

## 2019-07-25 DIAGNOSIS — B20 Human immunodeficiency virus [HIV] disease: Secondary | ICD-10-CM

## 2019-07-26 ENCOUNTER — Encounter: Payer: Self-pay | Admitting: Internal Medicine

## 2019-07-26 ENCOUNTER — Other Ambulatory Visit (INDEPENDENT_AMBULATORY_CARE_PROVIDER_SITE_OTHER): Payer: Medicare Other

## 2019-07-26 DIAGNOSIS — B20 Human immunodeficiency virus [HIV] disease: Secondary | ICD-10-CM

## 2019-07-26 DIAGNOSIS — E039 Hypothyroidism, unspecified: Secondary | ICD-10-CM

## 2019-07-26 DIAGNOSIS — R739 Hyperglycemia, unspecified: Secondary | ICD-10-CM | POA: Diagnosis not present

## 2019-07-26 DIAGNOSIS — N400 Enlarged prostate without lower urinary tract symptoms: Secondary | ICD-10-CM | POA: Diagnosis not present

## 2019-07-26 DIAGNOSIS — E785 Hyperlipidemia, unspecified: Secondary | ICD-10-CM

## 2019-07-26 LAB — PSA: PSA: 2.81 ng/mL (ref 0.10–4.00)

## 2019-07-26 LAB — HEPATIC FUNCTION PANEL
ALT: 16 U/L (ref 0–53)
AST: 17 U/L (ref 0–37)
Albumin: 4.1 g/dL (ref 3.5–5.2)
Alkaline Phosphatase: 53 U/L (ref 39–117)
Bilirubin, Direct: 0.1 mg/dL (ref 0.0–0.3)
Total Bilirubin: 1 mg/dL (ref 0.2–1.2)
Total Protein: 6.6 g/dL (ref 6.0–8.3)

## 2019-07-26 LAB — LIPID PANEL
Cholesterol: 203 mg/dL — ABNORMAL HIGH (ref 0–200)
HDL: 65.8 mg/dL (ref >39.00)
LDL Cholesterol: 128 mg/dL — ABNORMAL HIGH (ref 0–99)
NonHDL: 137.38
Total CHOL/HDL Ratio: 3
Triglycerides: 49 mg/dL (ref 0.0–149.0)
VLDL: 9.8 mg/dL (ref 0.0–40.0)

## 2019-07-26 LAB — CBC WITH DIFFERENTIAL/PLATELET
Basophils Absolute: 0.1 10*3/uL (ref 0.0–0.1)
Basophils Relative: 1.5 % (ref 0.0–3.0)
Eosinophils Absolute: 0.2 10*3/uL (ref 0.0–0.7)
Eosinophils Relative: 4 % (ref 0.0–5.0)
HCT: 44.7 % (ref 39.0–52.0)
Hemoglobin: 15.3 g/dL (ref 13.0–17.0)
Lymphocytes Relative: 25.7 % (ref 12.0–46.0)
Lymphs Abs: 1.4 10*3/uL (ref 0.7–4.0)
MCHC: 34.3 g/dL (ref 30.0–36.0)
MCV: 95.7 fl (ref 78.0–100.0)
Monocytes Absolute: 0.5 10*3/uL (ref 0.1–1.0)
Monocytes Relative: 10.2 % (ref 3.0–12.0)
Neutro Abs: 3.1 10*3/uL (ref 1.4–7.7)
Neutrophils Relative %: 58.6 % (ref 43.0–77.0)
Platelets: 173 10*3/uL (ref 150.0–400.0)
RBC: 4.67 Mil/uL (ref 4.22–5.81)
RDW: 13.2 % (ref 11.5–15.5)
WBC: 5.3 10*3/uL (ref 4.0–10.5)

## 2019-07-26 LAB — BASIC METABOLIC PANEL
BUN: 27 mg/dL — ABNORMAL HIGH (ref 6–23)
CO2: 26 mEq/L (ref 19–32)
Calcium: 9.2 mg/dL (ref 8.4–10.5)
Chloride: 105 mEq/L (ref 96–112)
Creatinine, Ser: 0.93 mg/dL (ref 0.40–1.50)
GFR: 80.03 mL/min (ref >60.00)
Glucose, Bld: 103 mg/dL — ABNORMAL HIGH (ref 70–99)
Potassium: 3.9 mEq/L (ref 3.5–5.1)
Sodium: 139 mEq/L (ref 135–145)

## 2019-07-26 LAB — TSH: TSH: 0.66 u[IU]/mL (ref 0.35–4.50)

## 2019-07-26 LAB — HEMOGLOBIN A1C: Hgb A1c MFr Bld: 5.6 % (ref 4.6–6.5)

## 2019-07-26 MED ORDER — PRAVASTATIN SODIUM 40 MG PO TABS: 40.0000 mg | ORAL_TABLET | Freq: Every day | ORAL | 1 refills | Status: DC

## 2019-07-26 NOTE — Addendum Note (Signed)
Addended by: Janith Lima on: 07/26/2019 05:14 PM   Modules accepted: Orders

## 2019-07-31 ENCOUNTER — Encounter: Payer: Self-pay | Admitting: Internal Medicine

## 2019-07-31 LAB — HIV-1 RNA QUANT-NO REFLEX-BLD
HIV 1 RNA Quant: <20 copies/mL
HIV-1 RNA Quant, Log: <1.3 Log copies/mL

## 2019-08-01 DIAGNOSIS — Z79899 Other long term (current) drug therapy: Secondary | ICD-10-CM | POA: Diagnosis not present

## 2019-08-01 DIAGNOSIS — Z6826 Body mass index (BMI) 26.0-26.9, adult: Secondary | ICD-10-CM | POA: Diagnosis not present

## 2019-08-01 DIAGNOSIS — B20 Human immunodeficiency virus [HIV] disease: Secondary | ICD-10-CM | POA: Diagnosis not present

## 2019-08-01 DIAGNOSIS — E785 Hyperlipidemia, unspecified: Secondary | ICD-10-CM | POA: Diagnosis not present

## 2019-08-01 DIAGNOSIS — E039 Hypothyroidism, unspecified: Secondary | ICD-10-CM | POA: Diagnosis not present

## 2019-08-03 DIAGNOSIS — Z23 Encounter for immunization: Secondary | ICD-10-CM | POA: Diagnosis not present

## 2019-09-02 ENCOUNTER — Other Ambulatory Visit: Payer: Self-pay

## 2019-09-02 ENCOUNTER — Encounter: Payer: Self-pay | Admitting: Internal Medicine

## 2019-09-02 ENCOUNTER — Ambulatory Visit (INDEPENDENT_AMBULATORY_CARE_PROVIDER_SITE_OTHER): Payer: Medicare Other | Admitting: Internal Medicine

## 2019-09-02 VITALS — BP 130/80 | HR 68 | Temp 98.4°F | Resp 16 | Ht 74.0 in | Wt 207.0 lb

## 2019-09-02 DIAGNOSIS — E785 Hyperlipidemia, unspecified: Secondary | ICD-10-CM

## 2019-09-02 DIAGNOSIS — E039 Hypothyroidism, unspecified: Secondary | ICD-10-CM | POA: Diagnosis not present

## 2019-09-02 DIAGNOSIS — N138 Other obstructive and reflux uropathy: Secondary | ICD-10-CM

## 2019-09-02 DIAGNOSIS — N401 Enlarged prostate with lower urinary tract symptoms: Secondary | ICD-10-CM

## 2019-09-02 DIAGNOSIS — N400 Enlarged prostate without lower urinary tract symptoms: Secondary | ICD-10-CM

## 2019-09-02 MED ORDER — ALFUZOSIN HCL ER 10 MG PO TB24: 10.0000 mg | ORAL_TABLET | Freq: Every day | ORAL | 1 refills | Status: DC

## 2019-09-02 NOTE — Patient Instructions (Signed)
Hypothyroidism  Hypothyroidism is when the thyroid gland does not make enough of certain hormones (it is underactive). The thyroid gland is a small gland located in the lower front part of the neck, just in front of the windpipe (trachea). This gland makes hormones that help control how the body uses food for energy (metabolism) as well as how the heart and brain function. These hormones also play a role in keeping your bones strong. When the thyroid is underactive, it produces too little of the hormones thyroxine (T4) and triiodothyronine (T3). What are the causes? This condition may be caused by:  Hashimoto's disease. This is a disease in which the body's disease-fighting system (immune system) attacks the thyroid gland. This is the most common cause.  Viral infections.  Pregnancy.  Certain medicines.  Birth defects.  Past radiation treatments to the head or neck for cancer.  Past treatment with radioactive iodine.  Past exposure to radiation in the environment.  Past surgical removal of part or all of the thyroid.  Problems with a gland in the center of the brain (pituitary gland).  Lack of enough iodine in the diet. What increases the risk? You are more likely to develop this condition if:  You are male.  You have a family history of thyroid conditions.  You use a medicine called lithium.  You take medicines that affect the immune system (immunosuppressants). What are the signs or symptoms? Symptoms of this condition include:  Feeling as though you have no energy (lethargy).  Not being able to tolerate cold.  Weight gain that is not explained by a change in diet or exercise habits.  Lack of appetite.  Dry skin.  Coarse hair.  Menstrual irregularity.  Slowing of thought processes.  Constipation.  Sadness or depression. How is this diagnosed? This condition may be diagnosed based on:  Your symptoms, your medical history, and a physical exam.  Blood  tests. You may also have imaging tests, such as an ultrasound or MRI. How is this treated? This condition is treated with medicine that replaces the thyroid hormones that your body does not make. After you begin treatment, it may take several weeks for symptoms to go away. Follow these instructions at home:  Take over-the-counter and prescription medicines only as told by your health care provider.  If you start taking any new medicines, tell your health care provider.  Keep all follow-up visits as told by your health care provider. This is important. ? As your condition improves, your dosage of thyroid hormone medicine may change. ? You will need to have blood tests regularly so that your health care provider can monitor your condition. Contact a health care provider if:  Your symptoms do not get better with treatment.  You are taking thyroid replacement medicine and you: ? Sweat a lot. ? Have tremors. ? Feel anxious. ? Lose weight rapidly. ? Cannot tolerate heat. ? Have emotional swings. ? Have diarrhea. ? Feel weak. Get help right away if you have:  Chest pain.  An irregular heartbeat.  A rapid heartbeat.  Difficulty breathing. Summary  Hypothyroidism is when the thyroid gland does not make enough of certain hormones (it is underactive).  When the thyroid is underactive, it produces too little of the hormones thyroxine (T4) and triiodothyronine (T3).  The most common cause is Hashimoto's disease, a disease in which the body's disease-fighting system (immune system) attacks the thyroid gland. The condition can also be caused by viral infections, medicine, pregnancy, or past   radiation treatment to the head or neck.  Symptoms may include weight gain, dry skin, constipation, feeling as though you do not have energy, and not being able to tolerate cold.  This condition is treated with medicine to replace the thyroid hormones that your body does not make. This information  is not intended to replace advice given to you by your health care provider. Make sure you discuss any questions you have with your health care provider. Document Released: 11/07/2005 Document Revised: 10/20/2017 Document Reviewed: 10/18/2017 Elsevier Patient Education  2020 [employer] Inc.  

## 2019-09-02 NOTE — Progress Notes (Signed)
Subjective:  Patient ID: Zachary Levy, male    DOB: March 16, 1946  Age: 73 y.o. MRN: [id number]  CC: Hypothyroidism and Hyperlipidemia   HPI Zachary Tumolo presents for f/up - He complains of nocturia up to 2-3 times a night with weak urine stream and frequency.  Outpatient Medications Prior to Visit  Medication Sig Dispense Refill   Emtricitab-Rilpivir-Tenofov AF (ODEFSEY PO) Take by mouth.     levothyroxine (SYNTHROID) 137 MCG tablet Take 1 tablet (137 mcg total) by mouth daily before breakfast. 90 tablet 1   raltegravir (ISENTRESS) 400 MG tablet Take 400 mg by mouth 2 (two) times daily.     pravastatin (PRAVACHOL) 40 MG tablet Take 1 tablet (40 mg total) by mouth daily. (Patient not taking: Reported on 09/02/2019) 90 tablet 1   No facility-administered medications prior to visit.     ROS Review of Systems  Constitutional: Positive for unexpected weight change (wt gain). Negative for appetite change, chills, diaphoresis and fatigue.  HENT: Negative for trouble swallowing.   Eyes: Negative for visual disturbance.  Respiratory: Negative for cough, chest tightness, shortness of breath and wheezing.   Cardiovascular: Negative for chest pain, palpitations and leg swelling.  Gastrointestinal: Negative for abdominal pain, constipation, diarrhea, nausea and vomiting.  Endocrine: Negative for cold intolerance and heat intolerance.  Genitourinary: Positive for difficulty urinating and frequency. Negative for decreased urine volume, discharge, dysuria, hematuria, penile pain, penile swelling and testicular pain.  Musculoskeletal: Negative.  Negative for arthralgias and myalgias.  Skin: Negative.   Neurological: Negative.  Negative for dizziness, weakness and light-headedness.  Hematological: Negative for adenopathy. Does not bruise/bleed easily.  Psychiatric/Behavioral: Negative.     Objective:  BP 130/80 (BP Location: Left Arm, Patient Position: Sitting, Cuff Size: Normal)    Pulse 68     Temp 98.4 F (36.9 C) (Oral)    Resp 16    Ht 6\' 2"  (1.88 m)    Wt 207 lb (93.9 kg)    SpO2 98%    BMI 26.58 kg/m   BP Readings from Last 3 Encounters:  09/02/19 130/80  07/02/19 120/70  08/30/18 112/80    Wt Readings from Last 3 Encounters:  09/02/19 207 lb (93.9 kg)  07/02/19 199 lb 12 oz (90.6 kg)  08/30/18 206 lb (93.4 kg)    Physical Exam Vitals signs reviewed.  Constitutional:      Appearance: Normal appearance.  HENT:     Nose: Nose normal.     Mouth/Throat:     Mouth: Mucous membranes are moist.  Eyes:     General: No scleral icterus.    Conjunctiva/sclera: Conjunctivae normal.  Neck:     Musculoskeletal: Normal range of motion and neck supple.  Cardiovascular:     Rate and Rhythm: Normal rate and regular rhythm.     Heart sounds: No murmur.  Pulmonary:     Effort: Pulmonary effort is normal.     Breath sounds: No stridor. No wheezing, rhonchi or rales.  Abdominal:     General: Abdomen is flat. Bowel sounds are normal. There is no distension.     Palpations: Abdomen is soft. There is no hepatomegaly or splenomegaly.     Tenderness: There is no abdominal tenderness.     Hernia: There is no hernia in the left inguinal area or right inguinal area.  Genitourinary:    Pubic Area: No rash.      Penis: Normal. No discharge, swelling or lesions.      Scrotum/Testes: Normal.  Right: Mass, tenderness or swelling not present.        Left: Mass, tenderness or swelling not present.     Epididymis:     Right: Normal. Not inflamed or enlarged. No mass.     Left: Normal. Not inflamed or enlarged. No mass.     Prostate: Enlarged (1+ smooth symm BPH). Not tender and no nodules present.     Rectum: Normal. Guaiac result negative. No mass, tenderness, anal fissure, external hemorrhoid or internal hemorrhoid. Normal anal tone.  Musculoskeletal: Normal range of motion.     Right lower leg: No edema.     Left lower leg: No edema.  Lymphadenopathy:     Cervical: No  cervical adenopathy.     Lower Body: No right inguinal adenopathy. No left inguinal adenopathy.  Skin:    General: Skin is warm and dry.     Coloration: Skin is not pale.  Neurological:     General: No focal deficit present.     Mental Status: He is alert.  Psychiatric:        Mood and Affect: Mood normal.        Behavior: Behavior normal.     Lab Results  Component Value Date   WBC 5.3 07/26/2019   HGB 15.3 07/26/2019   HCT 44.7 07/26/2019   PLT 173.0 07/26/2019   GLUCOSE 103 (H) 07/26/2019   CHOL 203 (H) 07/26/2019   TRIG 49.0 07/26/2019   HDL 65.80 07/26/2019   LDLCALC 128 (H) 07/26/2019   ALT 16 07/26/2019   AST 17 07/26/2019   NA 139 07/26/2019   K 3.9 07/26/2019   CL 105 07/26/2019   CREATININE 0.93 07/26/2019   BUN 27 (H) 07/26/2019   CO2 26 07/26/2019   TSH 0.66 07/26/2019   PSA 2.81 07/26/2019   INR 1.0 02/12/2007   HGBA1C 5.6 07/26/2019    Dg Cervical Spine Complete  Result Date: 04/09/2013 *RADIOLOGY REPORT* Clinical Data: Neck and arm pain, no trauma CERVICAL SPINE - COMPLETE 4+ VIEW Comparison: None. Findings: The cervical vertebrae are in normal alignment.  There is degenerative disc disease at C5-6 and C6-7 with some loss of disc space, sclerosis, and spurring.  No prevertebral soft tissue swelling is seen.  There is mild foraminal narrowing at C5-6 with no significant narrowing at C6-7.  The odontoid process is intact. The lung apices are clear. IMPRESSION: Degenerative disc disease at C5-6 and C6-7 with mild foraminal narrowing at C5-6. Original Report Authenticated By: Ivar Drape, M.D.   Dg Knee 1-2 Views Left  Result Date: 04/09/2013 *RADIOLOGY REPORT* Clinical Data: Knee pain, no trauma LEFT KNEE - 1-2 VIEW Comparison: None. Findings: There may be very minimal decrease in medial joint space. No significant degenerative spurring is seen although there is a tiny spur emanating from the superior aspect of the patella.  No fracture is seen and no effusion  is noted. IMPRESSION: Minimal degenerative change with some loss of medial joint space and minimal patellar spurring. Original Report Authenticated By: Ivar Drape, M.D.   Dg Knee 1-2 Views Right  Result Date: 04/09/2013 *RADIOLOGY REPORT* Clinical Data: Knee pain, no trauma RIGHT KNEE - 1-2 VIEW Comparison: None. Findings: Very minimal degenerative spurring is noted at the patellofemoral articulation.  No significant degenerative change is seen.  The joint spaces are relatively well preserved for age.  No effusion is noted. IMPRESSION: Minimal patellar spurring.  No significant degenerative change. Original Report Authenticated By: Ivar Drape, M.D.    Assessment &  Plan:   Girish was seen today for hypothyroidism and hyperlipidemia.  Diagnoses and all orders for this visit:  Adult hypothyroidism- His recent TSH was in the normal range.  I recommended he stay on the current dose of levothyroxine.  Dyslipidemia, goal LDL below 130- He is not willing to take a statin for CV risk reduction.  Benign prostatic hyperplasia without lower urinary tract symptoms  BPH with obstruction/lower urinary tract symptoms- I have asked him to start taking a peripheral alpha-blocker to improve his symptoms. -     alfuzosin (UROXATRAL) 10 MG 24 hr tablet; Take 1 tablet (10 mg total) by mouth daily with breakfast.   I have discontinued Jeneen Rinks Bodine's pravastatin. I am also having him start on alfuzosin. Additionally, I am having him maintain his raltegravir, Emtricitab-Rilpivir-Tenofov AF (ODEFSEY PO), and levothyroxine.  Meds ordered this encounter  Medications   alfuzosin (UROXATRAL) 10 MG 24 hr tablet    Sig: Take 1 tablet (10 mg total) by mouth daily with breakfast.    Dispense:  90 tablet    Refill:  1     Follow-up: Return in about 6 months (around 03/02/2020).  Scarlette Calico, MD

## 2019-09-30 DIAGNOSIS — L821 Other seborrheic keratosis: Secondary | ICD-10-CM | POA: Diagnosis not present

## 2019-09-30 DIAGNOSIS — L814 Other melanin hyperpigmentation: Secondary | ICD-10-CM | POA: Diagnosis not present

## 2019-09-30 DIAGNOSIS — C4442 Squamous cell carcinoma of skin of scalp and neck: Secondary | ICD-10-CM | POA: Diagnosis not present

## 2019-09-30 DIAGNOSIS — D1801 Hemangioma of skin and subcutaneous tissue: Secondary | ICD-10-CM | POA: Diagnosis not present

## 2019-09-30 DIAGNOSIS — L570 Actinic keratosis: Secondary | ICD-10-CM | POA: Diagnosis not present

## 2019-09-30 DIAGNOSIS — D485 Neoplasm of uncertain behavior of skin: Secondary | ICD-10-CM | POA: Diagnosis not present

## 2019-09-30 DIAGNOSIS — L720 Epidermal cyst: Secondary | ICD-10-CM | POA: Diagnosis not present

## 2019-10-12 ENCOUNTER — Other Ambulatory Visit: Payer: Self-pay | Admitting: Internal Medicine

## 2019-10-12 DIAGNOSIS — E039 Hypothyroidism, unspecified: Secondary | ICD-10-CM

## 2019-10-12 MED ORDER — LEVOTHYROXINE SODIUM 137 MCG PO TABS: 137.0000 ug | ORAL_TABLET | Freq: Every day | ORAL | 1 refills | Status: DC

## 2019-11-01 ENCOUNTER — Encounter: Payer: Self-pay | Admitting: Internal Medicine

## 2019-11-01 MED ORDER — LEVOTHYROXINE SODIUM 137 MCG PO TABS: 137.0000 ug | ORAL_TABLET | Freq: Every day | ORAL | 1 refills | Status: DC

## 2019-11-07 ENCOUNTER — Telehealth: Payer: Self-pay | Admitting: Internal Medicine

## 2019-11-07 NOTE — Telephone Encounter (Signed)
Copied from [place] [phone]. Topic: General - Other >> Nov 07, 2019  3:01 PM Keene Breath wrote: Reason for CRM: Called to inform the doctor that the pharmacy does not have the generic for Synthroid and would like an alternative for the patient.  CB# [phone]

## 2019-11-11 NOTE — Telephone Encounter (Signed)
This has been taken care of via my chart message that pt sent.

## 2019-12-09 LAB — LIPID PANEL
Chol/HDL Ratio: 3.3 (ref 0.0–4.4)
Cholesterol: 183 mg/dL (ref 100–200)
HDL: 56 mg/dL (ref 55–72)
LDL Cholesterol: 109 mg/dL — ABNORMAL HIGH (ref 0.0–100.0)
LDL/HDL Ratio: 1.9
Triglycerides: 91 mg/dL (ref 0–149)
VLDL: 18.2 mg/dL (ref 5.0–40.0)

## 2019-12-09 LAB — COMPREHENSIVE METABOLIC PANEL
ALT: 22 U/L (ref 0–41)
AST: 19 U/L (ref 0–40)
Albumin/Globulin Ratio: 2 mmol/L (ref 1.00–2.00)
Albumin: 4.5 g/dL (ref 3.5–5.2)
Alk Phosphatase: 66 U/L (ref 40–130)
Anion Gap: 10 mmol/L (ref 2–17)
BUN: 14 mg/dL (ref 8–23)
CO2: 29 mmol/L (ref 22–29)
Calcium: 9.3 mg/dL (ref 8.8–10.2)
Chloride: 102 mmol/L (ref 98–107)
Creatinine: 0.9 mg/dL (ref 0.7–1.3)
GFR African American: 99 mL/min/{1.73_m2} (ref >=90)
GFR Non-African American: 86 mL/min/{1.73_m2} — ABNORMAL LOW (ref >=90)
Globulin: 2 g/dL (ref 1.9–4.4)
Glucose: 115 mg/dL — ABNORMAL HIGH (ref 70–99)
OSMOLALITY CALCULATED: 283 mOsm/kg (ref 270–287)
Potassium: 4.4 mmol/L (ref 3.5–5.3)
Sodium: 141 mmol/L (ref 135–145)
Total Bilirubin: 0.4 mg/dL (ref 0.00–1.20)
Total Protein: 6.7 g/dL (ref 6.4–8.3)

## 2019-12-12 ENCOUNTER — Ambulatory Visit: Payer: Medicare Other | Attending: Internal Medicine

## 2019-12-12 DIAGNOSIS — Z23 Encounter for immunization: Secondary | ICD-10-CM | POA: Insufficient documentation

## 2019-12-12 NOTE — Progress Notes (Signed)
   Covid-19 Vaccination Clinic  Name:  Zachary Levy    MRN: [id number] DOB: [birth year]  12/12/2019  [name] was observed post Covid-19 immunization for 15 minutes without incidence. He was provided with Vaccine Information Sheet and instruction to access the V-Safe system.   [name] was instructed to call 911 with any severe reactions post vaccine: Marland Kitchen Difficulty breathing  . Swelling of your face and throat  . A fast heartbeat  . A bad rash all over your body  . Dizziness and weakness    Immunizations Administered    Name Date Dose VIS Date Route   Pfizer COVID-19 Vaccine 12/12/2019 10:26 AM 0.3 mL 11/01/2019 Intramuscular   Manufacturer: [place]   Lot: [id number]   [place]: [id number]

## 2020-01-02 ENCOUNTER — Ambulatory Visit: Payer: Medicare Other | Attending: Internal Medicine

## 2020-01-02 DIAGNOSIS — Z23 Encounter for immunization: Secondary | ICD-10-CM | POA: Insufficient documentation

## 2020-01-02 NOTE — Progress Notes (Signed)
   Covid-19 Vaccination Clinic  Name:  Zachary Levy    MRN: [id number] DOB: Oct 10, 1946  01/02/2020  Mr. Zachary Levy was observed post Covid-19 immunization for 15 minutes without incidence. He was provided with Vaccine Information Sheet and instruction to access the V-Safe system.   Mr. Zachary Levy was instructed to call 911 with any severe reactions post vaccine: Marland Kitchen Difficulty breathing  . Swelling of your face and throat  . A fast heartbeat  . A bad rash all over your body  . Dizziness and weakness    Immunizations Administered    Name Date Dose VIS Date Route   Pfizer COVID-19 Vaccine 01/02/2020 11:41 AM 0.3 mL 11/01/2019 Intramuscular   Manufacturer: [place]   Lot: [id number]   [place]: [id number]

## 2020-01-06 ENCOUNTER — Ambulatory Visit: Payer: Medicare Other

## 2020-01-22 ENCOUNTER — Encounter: Payer: Self-pay | Admitting: Internal Medicine

## 2020-01-27 ENCOUNTER — Other Ambulatory Visit: Payer: Self-pay | Admitting: Internal Medicine

## 2020-01-27 ENCOUNTER — Encounter: Payer: Self-pay | Admitting: Internal Medicine

## 2020-01-27 DIAGNOSIS — B20 Human immunodeficiency virus [HIV] disease: Secondary | ICD-10-CM

## 2020-01-27 DIAGNOSIS — N400 Enlarged prostate without lower urinary tract symptoms: Secondary | ICD-10-CM

## 2020-01-27 DIAGNOSIS — E039 Hypothyroidism, unspecified: Secondary | ICD-10-CM

## 2020-01-27 DIAGNOSIS — E785 Hyperlipidemia, unspecified: Secondary | ICD-10-CM

## 2020-01-28 ENCOUNTER — Other Ambulatory Visit (INDEPENDENT_AMBULATORY_CARE_PROVIDER_SITE_OTHER): Payer: Medicare Other

## 2020-01-28 DIAGNOSIS — B20 Human immunodeficiency virus [HIV] disease: Secondary | ICD-10-CM | POA: Diagnosis not present

## 2020-01-28 DIAGNOSIS — E785 Hyperlipidemia, unspecified: Secondary | ICD-10-CM | POA: Diagnosis not present

## 2020-01-28 DIAGNOSIS — E039 Hypothyroidism, unspecified: Secondary | ICD-10-CM | POA: Diagnosis not present

## 2020-01-28 DIAGNOSIS — N400 Enlarged prostate without lower urinary tract symptoms: Secondary | ICD-10-CM

## 2020-01-28 LAB — CBC WITH DIFFERENTIAL/PLATELET
Basophils Absolute: 0.1 10*3/uL (ref 0.0–0.1)
Basophils Relative: 1.2 % (ref 0.0–3.0)
Eosinophils Absolute: 0.1 10*3/uL (ref 0.0–0.7)
Eosinophils Relative: 3 % (ref 0.0–5.0)
HCT: 44.2 % (ref 39.0–52.0)
Hemoglobin: 15.2 g/dL (ref 13.0–17.0)
Lymphocytes Relative: 29.1 % (ref 12.0–46.0)
Lymphs Abs: 1.3 10*3/uL (ref 0.7–4.0)
MCHC: 34.4 g/dL (ref 30.0–36.0)
MCV: 95.3 fl (ref 78.0–100.0)
Monocytes Absolute: 0.4 10*3/uL (ref 0.1–1.0)
Monocytes Relative: 8.8 % (ref 3.0–12.0)
Neutro Abs: 2.6 10*3/uL (ref 1.4–7.7)
Neutrophils Relative %: 57.9 % (ref 43.0–77.0)
Platelets: 166 10*3/uL (ref 150.0–400.0)
RBC: 4.63 Mil/uL (ref 4.22–5.81)
RDW: 13.5 % (ref 11.5–15.5)
WBC: 4.6 10*3/uL (ref 4.0–10.5)

## 2020-01-28 LAB — LIPID PANEL
Cholesterol: 250 mg/dL — ABNORMAL HIGH (ref 0–200)
HDL: 81.2 mg/dL (ref >39.00)
LDL Cholesterol: 156 mg/dL — ABNORMAL HIGH (ref 0–99)
NonHDL: 169.11
Total CHOL/HDL Ratio: 3
Triglycerides: 67 mg/dL (ref 0.0–149.0)
VLDL: 13.4 mg/dL (ref 0.0–40.0)

## 2020-01-28 LAB — HEPATIC FUNCTION PANEL
ALT: 25 U/L (ref 0–53)
AST: 21 U/L (ref 0–37)
Albumin: 4.2 g/dL (ref 3.5–5.2)
Alkaline Phosphatase: 58 U/L (ref 39–117)
Bilirubin, Direct: 0.2 mg/dL (ref 0.0–0.3)
Total Bilirubin: 1.2 mg/dL (ref 0.2–1.2)
Total Protein: 6.5 g/dL (ref 6.0–8.3)

## 2020-01-28 LAB — BASIC METABOLIC PANEL
BUN: 13 mg/dL (ref 6–23)
CO2: 28 mEq/L (ref 19–32)
Calcium: 9.4 mg/dL (ref 8.4–10.5)
Chloride: 102 mEq/L (ref 96–112)
Creatinine, Ser: 0.93 mg/dL (ref 0.40–1.50)
GFR: 79.92 mL/min (ref >60.00)
Glucose, Bld: 97 mg/dL (ref 70–99)
Potassium: 3.8 mEq/L (ref 3.5–5.1)
Sodium: 136 mEq/L (ref 135–145)

## 2020-01-28 LAB — TSH: TSH: 1.37 u[IU]/mL (ref 0.35–4.50)

## 2020-01-28 LAB — PSA: PSA: 2.9 ng/mL (ref 0.10–4.00)

## 2020-01-29 ENCOUNTER — Encounter: Payer: Self-pay | Admitting: Internal Medicine

## 2020-02-01 LAB — HEPATITIS C ANTIBODY
Hepatitis C Ab: NONREACTIVE
SIGNAL TO CUT-OFF: 0.02 (ref <1.00)

## 2020-02-01 LAB — QUANTIFERON-TB GOLD PLUS
Mitogen-NIL: >10 IU/mL
NIL: 0.1 IU/mL
QuantiFERON-TB Gold Plus: NEGATIVE
TB1-NIL: <0 IU/mL
TB2-NIL: <0 IU/mL

## 2020-02-01 LAB — HIV-1 RNA, QUALITATIVE, TMA: HIV-1 RNA, Qualitative, TMA: NOT DETECTED

## 2020-02-01 LAB — RPR: RPR Ser Ql: NONREACTIVE

## 2020-02-01 LAB — T-HELPER CELLS (CD4) COUNT (NOT AT ARMC)
Absolute CD4: 505 cells/uL (ref 490–1740)
CD4 T Helper %: 37 % (ref 30–61)
Total lymphocyte count: 1364 cells/uL (ref 850–3900)

## 2020-02-02 ENCOUNTER — Encounter: Payer: Self-pay | Admitting: Internal Medicine

## 2020-02-13 ENCOUNTER — Encounter: Payer: Self-pay | Admitting: Internal Medicine

## 2020-02-20 DIAGNOSIS — E039 Hypothyroidism, unspecified: Secondary | ICD-10-CM | POA: Diagnosis not present

## 2020-02-20 DIAGNOSIS — B20 Human immunodeficiency virus [HIV] disease: Secondary | ICD-10-CM | POA: Diagnosis not present

## 2020-02-20 DIAGNOSIS — E785 Hyperlipidemia, unspecified: Secondary | ICD-10-CM | POA: Diagnosis not present

## 2020-02-20 DIAGNOSIS — Z6827 Body mass index (BMI) 27.0-27.9, adult: Secondary | ICD-10-CM | POA: Diagnosis not present

## 2020-03-01 ENCOUNTER — Encounter: Payer: Self-pay | Admitting: Internal Medicine

## 2020-03-02 ENCOUNTER — Other Ambulatory Visit: Payer: Self-pay | Admitting: Internal Medicine

## 2020-03-02 DIAGNOSIS — N3281 Overactive bladder: Secondary | ICD-10-CM | POA: Insufficient documentation

## 2020-03-02 MED ORDER — MIRABEGRON ER 25 MG PO TB24: 25.0000 mg | ORAL_TABLET | Freq: Every day | ORAL | 0 refills | Status: DC

## 2020-05-07 ENCOUNTER — Telehealth: Payer: Self-pay | Admitting: Internal Medicine

## 2020-05-07 DIAGNOSIS — E039 Hypothyroidism, unspecified: Secondary | ICD-10-CM

## 2020-05-08 ENCOUNTER — Encounter: Payer: Self-pay | Admitting: Internal Medicine

## 2020-05-08 DIAGNOSIS — E039 Hypothyroidism, unspecified: Secondary | ICD-10-CM

## 2020-05-08 MED ORDER — LEVOTHYROXINE SODIUM 137 MCG PO TABS: 137.0000 ug | ORAL_TABLET | Freq: Every day | ORAL | 0 refills | Status: DC

## 2020-05-08 NOTE — Telephone Encounter (Signed)
    Patient states he will run out of medication over the weekend Appointment scheduled for 05/13/20 Please refill levothyroxine (SYNTHROID) 137 MCG tablet

## 2020-05-13 ENCOUNTER — Ambulatory Visit (INDEPENDENT_AMBULATORY_CARE_PROVIDER_SITE_OTHER): Payer: Medicare Other | Admitting: Internal Medicine

## 2020-05-13 ENCOUNTER — Other Ambulatory Visit: Payer: Self-pay

## 2020-05-13 ENCOUNTER — Other Ambulatory Visit (INDEPENDENT_AMBULATORY_CARE_PROVIDER_SITE_OTHER): Payer: Medicare Other

## 2020-05-13 ENCOUNTER — Encounter: Payer: Self-pay | Admitting: Internal Medicine

## 2020-05-13 VITALS — BP 124/78 | HR 64 | Temp 97.9°F | Resp 16 | Ht 74.0 in | Wt 204.0 lb

## 2020-05-13 DIAGNOSIS — E039 Hypothyroidism, unspecified: Secondary | ICD-10-CM | POA: Diagnosis not present

## 2020-05-13 DIAGNOSIS — E785 Hyperlipidemia, unspecified: Secondary | ICD-10-CM

## 2020-05-13 DIAGNOSIS — B20 Human immunodeficiency virus [HIV] disease: Secondary | ICD-10-CM | POA: Insufficient documentation

## 2020-05-13 DIAGNOSIS — Z21 Asymptomatic human immunodeficiency virus [HIV] infection status: Secondary | ICD-10-CM | POA: Insufficient documentation

## 2020-05-13 LAB — TSH: TSH: 2.34 u[IU]/mL (ref 0.35–4.50)

## 2020-05-13 MED ORDER — LEVOTHYROXINE SODIUM 137 MCG PO TABS: 137.0000 ug | ORAL_TABLET | Freq: Every day | ORAL | 1 refills | Status: DC

## 2020-05-13 NOTE — Patient Instructions (Signed)
Hypothyroidism  Hypothyroidism is when the thyroid gland does not make enough of certain hormones (it is underactive). The thyroid gland is a small gland located in the lower front part of the neck, just in front of the windpipe (trachea). This gland makes hormones that help control how the body uses food for energy (metabolism) as well as how the heart and brain function. These hormones also play a role in keeping your bones strong. When the thyroid is underactive, it produces too little of the hormones thyroxine (T4) and triiodothyronine (T3). What are the causes? This condition may be caused by:  Hashimoto's disease. This is a disease in which the body's disease-fighting system (immune system) attacks the thyroid gland. This is the most common cause.  Viral infections.  Pregnancy.  Certain medicines.  Birth defects.  Past radiation treatments to the head or neck for cancer.  Past treatment with radioactive iodine.  Past exposure to radiation in the environment.  Past surgical removal of part or all of the thyroid.  Problems with a gland in the center of the brain (pituitary gland).  Lack of enough iodine in the diet. What increases the risk? You are more likely to develop this condition if:  You are male.  You have a family history of thyroid conditions.  You use a medicine called lithium.  You take medicines that affect the immune system (immunosuppressants). What are the signs or symptoms? Symptoms of this condition include:  Feeling as though you have no energy (lethargy).  Not being able to tolerate cold.  Weight gain that is not explained by a change in diet or exercise habits.  Lack of appetite.  Dry skin.  Coarse hair.  Menstrual irregularity.  Slowing of thought processes.  Constipation.  Sadness or depression. How is this diagnosed? This condition may be diagnosed based on:  Your symptoms, your medical history, and a physical exam.  Blood  tests. You may also have imaging tests, such as an ultrasound or MRI. How is this treated? This condition is treated with medicine that replaces the thyroid hormones that your body does not make. After you begin treatment, it may take several weeks for symptoms to go away. Follow these instructions at home:  Take over-the-counter and prescription medicines only as told by your health care provider.  If you start taking any new medicines, tell your health care provider.  Keep all follow-up visits as told by your health care provider. This is important. ? As your condition improves, your dosage of thyroid hormone medicine may change. ? You will need to have blood tests regularly so that your health care provider can monitor your condition. Contact a health care provider if:  Your symptoms do not get better with treatment.  You are taking thyroid replacement medicine and you: ? Sweat a lot. ? Have tremors. ? Feel anxious. ? Lose weight rapidly. ? Cannot tolerate heat. ? Have emotional swings. ? Have diarrhea. ? Feel weak. Get help right away if you have:  Chest pain.  An irregular heartbeat.  A rapid heartbeat.  Difficulty breathing. Summary  Hypothyroidism is when the thyroid gland does not make enough of certain hormones (it is underactive).  When the thyroid is underactive, it produces too little of the hormones thyroxine (T4) and triiodothyronine (T3).  The most common cause is Hashimoto's disease, a disease in which the body's disease-fighting system (immune system) attacks the thyroid gland. The condition can also be caused by viral infections, medicine, pregnancy, or past   radiation treatment to the head or neck.  Symptoms may include weight gain, dry skin, constipation, feeling as though you do not have energy, and not being able to tolerate cold.  This condition is treated with medicine to replace the thyroid hormones that your body does not make. This information  is not intended to replace advice given to you by your health care provider. Make sure you discuss any questions you have with your health care provider. Document Revised: 10/20/2017 Document Reviewed: 10/18/2017 Elsevier Patient Education  2020 [employer]  

## 2020-05-13 NOTE — Progress Notes (Signed)
Subjective:  Patient ID: Zachary Levy, male    DOB: November 29, 1945  Age: 74 y.o. MRN: [id number]  CC: Hypothyroidism  This visit occurred during the SARS-CoV-2 public health emergency.  Safety protocols were in place, including screening questions prior to the visit, additional usage of staff PPE, and extensive cleaning of exam room while observing appropriate contact time as indicated for disinfecting solutions.    HPI Zachary Levy presents for f/up - He has felt well recently and offers no complaints.  He is active and denies any recent episodes of chest pain, shortness of breath, palpitations, edema, or fatigue.  He tells me his HIV specialist at [facility] is very pleased with his response to the antiretroviral therapy.  Outpatient Medications Prior to Visit  Medication Sig Dispense Refill  . Emtricitab-Rilpivir-Tenofov AF (ODEFSEY PO) Take by mouth.    . mirabegron ER (MYRBETRIQ) 25 MG TB24 tablet Take 1 tablet (25 mg total) by mouth daily. 90 tablet 0  . raltegravir (ISENTRESS) 400 MG tablet Take 400 mg by mouth 2 (two) times daily.    Marland Kitchen levothyroxine (SYNTHROID) 137 MCG tablet Take 1 tablet (137 mcg total) by mouth daily before breakfast. 30 tablet 0  . alfuzosin (UROXATRAL) 10 MG 24 hr tablet Take 1 tablet (10 mg total) by mouth daily with breakfast. 90 tablet 1   No facility-administered medications prior to visit.    ROS Review of Systems  Constitutional: Negative for appetite change, fatigue and unexpected weight change.  HENT: Negative.  Negative for trouble swallowing.   Eyes: Negative for visual disturbance.  Respiratory: Negative for cough, chest tightness, shortness of breath and wheezing.   Cardiovascular: Negative for chest pain, palpitations and leg swelling.  Gastrointestinal: Negative for abdominal pain, constipation, diarrhea and vomiting.  Endocrine: Negative.  Negative for cold intolerance and heat intolerance.  Genitourinary: Negative.  Negative for difficulty  urinating.  Musculoskeletal: Negative.  Negative for arthralgias and myalgias.  Skin: Negative.   Neurological: Negative.  Negative for dizziness, weakness, light-headedness and headaches.  Hematological: Negative for adenopathy. Does not bruise/bleed easily.  Psychiatric/Behavioral: Negative.     Objective:  BP 124/78 (BP Location: Left Arm, Patient Position: Sitting, Cuff Size: Normal)   Pulse 64   Temp 97.9 F (36.6 C) (Oral)   Resp 16   Ht 6\' 2"  (1.88 m)   Wt 204 lb (92.5 kg)   SpO2 97%   BMI 26.19 kg/m   BP Readings from Last 3 Encounters:  05/13/20 124/78  09/02/19 130/80  07/02/19 120/70    Wt Readings from Last 3 Encounters:  05/13/20 204 lb (92.5 kg)  09/02/19 207 lb (93.9 kg)  07/02/19 199 lb 12 oz (90.6 kg)    Physical Exam Vitals reviewed.  Constitutional:      Appearance: Normal appearance.  HENT:     Nose: Nose normal.     Mouth/Throat:     Mouth: Mucous membranes are moist.  Eyes:     General: No scleral icterus.    Conjunctiva/sclera: Conjunctivae normal.  Cardiovascular:     Rate and Rhythm: Normal rate and regular rhythm.     Heart sounds: No murmur heard.   Pulmonary:     Effort: Pulmonary effort is normal.     Breath sounds: No wheezing, rhonchi or rales.  Abdominal:     General: Abdomen is flat.     Palpations: There is no mass.     Tenderness: There is no abdominal tenderness. There is no guarding.  Musculoskeletal:  General: Normal range of motion.     Cervical back: Neck supple.     Right lower leg: No edema.     Left lower leg: No edema.  Lymphadenopathy:     Cervical: No cervical adenopathy.  Skin:    General: Skin is warm and dry.  Neurological:     General: No focal deficit present.     Mental Status: He is alert and oriented to person, place, and time. Mental status is at baseline.  Psychiatric:        Mood and Affect: Mood normal.        Behavior: Behavior normal.     Lab Results  Component Value Date   WBC  4.6 01/28/2020   HGB 15.2 01/28/2020   HCT 44.2 01/28/2020   PLT 166.0 01/28/2020   GLUCOSE 97 01/28/2020   CHOL 250 (H) 01/28/2020   TRIG 67.0 01/28/2020   HDL 81.20 01/28/2020   LDLCALC 156 (H) 01/28/2020   ALT 25 01/28/2020   AST 21 01/28/2020   NA 136 01/28/2020   K 3.8 01/28/2020   CL 102 01/28/2020   CREATININE 0.93 01/28/2020   BUN 13 01/28/2020   CO2 28 01/28/2020   TSH 2.34 05/13/2020   PSA 2.90 01/28/2020   INR 1.0 02/12/2007   HGBA1C 5.6 07/26/2019    DG Cervical Spine Complete  Result Date: 04/09/2013 *RADIOLOGY REPORT* Clinical Data: Neck and arm pain, no trauma CERVICAL SPINE - COMPLETE 4+ VIEW Comparison: None. Findings: The cervical vertebrae are in normal alignment.  There is degenerative disc disease at C5-6 and C6-7 with some loss of disc space, sclerosis, and spurring.  No prevertebral soft tissue swelling is seen.  There is mild foraminal narrowing at C5-6 with no significant narrowing at C6-7.  The odontoid process is intact. The lung apices are clear. IMPRESSION: Degenerative disc disease at C5-6 and C6-7 with mild foraminal narrowing at C5-6. Original Report Authenticated By: Ivar Drape, M.D.   DG Knee 1-2 Views Left  Result Date: 04/09/2013 *RADIOLOGY REPORT* Clinical Data: Knee pain, no trauma LEFT KNEE - 1-2 VIEW Comparison: None. Findings: There may be very minimal decrease in medial joint space. No significant degenerative spurring is seen although there is a tiny spur emanating from the superior aspect of the patella.  No fracture is seen and no effusion is noted. IMPRESSION: Minimal degenerative change with some loss of medial joint space and minimal patellar spurring. Original Report Authenticated By: Ivar Drape, M.D.   DG Knee 1-2 Views Right  Result Date: 04/09/2013 *RADIOLOGY REPORT* Clinical Data: Knee pain, no trauma RIGHT KNEE - 1-2 VIEW Comparison: None. Findings: Very minimal degenerative spurring is noted at the patellofemoral articulation.   No significant degenerative change is seen.  The joint spaces are relatively well preserved for age.  No effusion is noted. IMPRESSION: Minimal patellar spurring.  No significant degenerative change. Original Report Authenticated By: Ivar Drape, M.D.    Assessment & Plan:   Zachary Levy was seen today for hypothyroidism.  Diagnoses and all orders for this visit:  Adult hypothyroidism- His TSH is in the normal range.  He will remain on the current dose of levothyroxine. -     TSH; Future -     levothyroxine (SYNTHROID) 137 MCG tablet; Take 1 tablet (137 mcg total) by mouth daily before breakfast.  Asymptomatic HIV infection ([place])- He is doing well on the current antiretroviral therapy.  Dyslipidemia, goal LDL below 130- He is not willing to take a statin for  cardiovascular risk reduction.   I have discontinued Zachary Levy's alfuzosin. I am also having him maintain his raltegravir, Emtricitab-Rilpivir-Tenofov AF (ODEFSEY PO), mirabegron ER, and levothyroxine.  Meds ordered this encounter  Medications  . levothyroxine (SYNTHROID) 137 MCG tablet    Sig: Take 1 tablet (137 mcg total) by mouth daily before breakfast.    Dispense:  90 tablet    Refill:  1     Follow-up: Return in about 6 months (around 11/12/2020).  Scarlette Calico, MD

## 2020-06-02 ENCOUNTER — Other Ambulatory Visit: Payer: Self-pay | Admitting: Internal Medicine

## 2020-06-02 DIAGNOSIS — N3281 Overactive bladder: Secondary | ICD-10-CM

## 2020-06-09 LAB — COMPREHENSIVE METABOLIC PANEL
ALT: 17 U/L (ref 0–41)
AST: 16 U/L (ref 0–40)
Albumin/Globulin Ratio: 2 mmol/L (ref 1.00–2.00)
Albumin: 4.6 g/dL (ref 3.5–5.2)
Alk Phosphatase: 65 U/L (ref 40–130)
Anion Gap: 10 mmol/L (ref 2–17)
BUN: 15 mg/dL (ref 8–23)
CO2: 27 mmol/L (ref 22–29)
Calcium: 9.5 mg/dL (ref 8.8–10.2)
Chloride: 103 mmol/L (ref 98–107)
Creatinine: 1.1 mg/dL (ref 0.7–1.3)
GFR African American: 77 mL/min/{1.73_m2} — ABNORMAL LOW (ref >=90)
GFR Non-African American: 67 mL/min/{1.73_m2} — ABNORMAL LOW (ref >=90)
Globulin: 2 g/dL (ref 1.9–4.4)
Glucose: 101 mg/dL — ABNORMAL HIGH (ref 70–99)
OSMOLALITY CALCULATED: 280 mOsm/kg (ref 270–287)
Potassium: 4.5 mmol/L (ref 3.5–5.3)
Sodium: 140 mmol/L (ref 135–145)
Total Bilirubin: 0.8 mg/dL (ref 0.00–1.20)
Total Protein: 6.9 g/dL (ref 6.4–8.3)

## 2020-06-09 LAB — LIPID PANEL
Chol/HDL Ratio: 3.4 (ref 0.0–4.4)
Cholesterol: 211 mg/dL — ABNORMAL HIGH (ref 100–200)
HDL: 62 mg/dL (ref 55–72)
LDL Cholesterol: 134 mg/dL — ABNORMAL HIGH (ref 0.0–100.0)
LDL/HDL Ratio: 2.2
Triglycerides: 74 mg/dL (ref 0–149)
VLDL: 14.8 mg/dL (ref 5.0–40.0)

## 2020-06-09 LAB — CBC WITH AUTO DIFFERENTIAL
Absolute Baso #: 0.1 10*3/uL (ref 0.0–0.2)
Absolute Eos #: 0.1 10*3/uL (ref 0.0–0.5)
Absolute Lymph #: 2.2 10*3/uL (ref 1.0–3.2)
Absolute Mono #: 0.6 10*3/uL (ref 0.3–1.0)
Basophils %: 0.7 % (ref 0.0–2.0)
Eosinophils %: 1.2 % (ref 0.0–7.0)
Hematocrit: 50.6 % (ref 38.0–52.0)
Hemoglobin: 17.5 g/dL — ABNORMAL HIGH (ref 13.0–17.3)
Immature Grans (Abs): 0.02 10*3/uL (ref 0.00–0.06)
Immature Granulocytes: 0.2 % (ref 0.1–0.6)
Lymphocytes: 26.7 % (ref 15.0–45.0)
MCH: 30.6 pg (ref 27.0–34.5)
MCHC: 34.6 g/dL (ref 32.0–36.0)
MCV: 88.5 fL (ref 84.0–100.0)
MPV: 10.5 fL (ref 7.2–13.2)
Monocytes: 6.7 % (ref 4.0–12.0)
NRBC Absolute: 0 10*3/uL (ref 0.000–0.012)
NRBC Automated: 0 % (ref 0.0–0.2)
Neutrophils %: 64.5 % (ref 42.0–74.0)
Neutrophils Absolute: 5.3 10*3/uL (ref 1.6–7.3)
Platelets: 290 10*3/uL (ref 140–440)
RBC: 5.72 x10e6/mcL — ABNORMAL HIGH (ref 4.00–5.60)
RDW: 12.7 % (ref 11.0–16.0)
WBC: 8.2 10*3/uL (ref 3.8–10.6)

## 2020-07-13 LAB — CBC
Hematocrit: 48.1 % (ref 38.0–52.0)
Hemoglobin: 17.2 g/dL (ref 13.0–17.3)
MCH: 31.6 pg (ref 27.0–34.5)
MCHC: 35.8 g/dL (ref 32.0–36.0)
MCV: 88.4 fL (ref 84.0–100.0)
MPV: 10.7 fL (ref 7.2–13.2)
NRBC Absolute: 0 10*3/uL (ref 0.000–0.012)
NRBC Automated: 0 % (ref 0.0–0.2)
Platelets: 291 10*3/uL (ref 140–440)
RBC: 5.44 x10e6/mcL (ref 4.00–5.60)
RDW: 12.9 % (ref 11.0–16.0)
WBC: 7.7 10*3/uL (ref 3.8–10.6)

## 2020-07-13 LAB — PROSTATE SPECIFIC ANTIGEN, TOTAL: PSA: 0.038 ng/mL (ref 0.000–4.000)

## 2020-07-19 LAB — TESTOSTERONE, FREE/TOT EQUILIB
Testosterone % Free: 3.16 % (ref 1.50–4.20)
Testosterone, Free: 19.28 ng/dL (ref 5.00–21.00)
Testosterone: 610 ng/dL (ref 264–916)

## 2020-08-16 DIAGNOSIS — Z23 Encounter for immunization: Secondary | ICD-10-CM | POA: Diagnosis not present

## 2020-09-08 DIAGNOSIS — H5203 Hypermetropia, bilateral: Secondary | ICD-10-CM | POA: Diagnosis not present

## 2020-09-08 DIAGNOSIS — H524 Presbyopia: Secondary | ICD-10-CM | POA: Diagnosis not present

## 2020-09-08 DIAGNOSIS — H2513 Age-related nuclear cataract, bilateral: Secondary | ICD-10-CM | POA: Diagnosis not present

## 2020-09-17 DIAGNOSIS — Z20822 Contact with and (suspected) exposure to covid-19: Secondary | ICD-10-CM | POA: Diagnosis not present

## 2020-09-30 DIAGNOSIS — L821 Other seborrheic keratosis: Secondary | ICD-10-CM | POA: Diagnosis not present

## 2020-09-30 DIAGNOSIS — D1801 Hemangioma of skin and subcutaneous tissue: Secondary | ICD-10-CM | POA: Diagnosis not present

## 2020-09-30 DIAGNOSIS — D225 Melanocytic nevi of trunk: Secondary | ICD-10-CM | POA: Diagnosis not present

## 2020-09-30 DIAGNOSIS — Z85828 Personal history of other malignant neoplasm of skin: Secondary | ICD-10-CM | POA: Diagnosis not present

## 2020-09-30 DIAGNOSIS — L570 Actinic keratosis: Secondary | ICD-10-CM | POA: Diagnosis not present

## 2020-09-30 DIAGNOSIS — L820 Inflamed seborrheic keratosis: Secondary | ICD-10-CM | POA: Diagnosis not present

## 2020-11-03 ENCOUNTER — Encounter: Payer: Self-pay | Admitting: Internal Medicine

## 2020-11-03 ENCOUNTER — Ambulatory Visit (INDEPENDENT_AMBULATORY_CARE_PROVIDER_SITE_OTHER): Payer: Medicare Other | Admitting: Internal Medicine

## 2020-11-03 ENCOUNTER — Other Ambulatory Visit: Payer: Self-pay

## 2020-11-03 VITALS — BP 116/72 | HR 64 | Temp 98.6°F | Resp 16 | Ht 74.0 in | Wt 207.0 lb

## 2020-11-03 DIAGNOSIS — E039 Hypothyroidism, unspecified: Secondary | ICD-10-CM | POA: Diagnosis not present

## 2020-11-03 DIAGNOSIS — N3281 Overactive bladder: Secondary | ICD-10-CM

## 2020-11-03 LAB — TSH: TSH: 2.1 u[IU]/mL (ref 0.35–4.50)

## 2020-11-03 MED ORDER — MIRABEGRON ER 50 MG PO TB24: 50.0000 mg | ORAL_TABLET | Freq: Every day | ORAL | 1 refills | Status: DC

## 2020-11-03 MED ORDER — LEVOTHYROXINE SODIUM 137 MCG PO TABS: 137.0000 ug | ORAL_TABLET | Freq: Every day | ORAL | 1 refills | Status: DC

## 2020-11-03 NOTE — Progress Notes (Signed)
Subjective:  Patient ID: Zachary Levy, male    DOB: 12/15/1945  Age: 74 y.o. MRN: [id number]  CC: Hypothyroidism  This visit occurred during the SARS-CoV-2 public health emergency.  Safety protocols were in place, including screening questions prior to the visit, additional usage of staff PPE, and extensive cleaning of exam room while observing appropriate contact time as indicated for disinfecting solutions.    HPI [name] presents for f/up - He continues to complain of nocturia up to 3 times a night and frequent urination during the day.  He denies signs and symptoms of obstructive uropathy.  He tells me he is taking the current dosage of Myrbetriq.  He also feels like his thyroid dosage is in the adequate range.  Outpatient Medications Prior to Visit  Medication Sig Dispense Refill  . Emtricitab-Rilpivir-Tenofov AF (ODEFSEY PO) Take by mouth.    . raltegravir (ISENTRESS) 400 MG tablet Take 400 mg by mouth 2 (two) times daily.    Marland Kitchen levothyroxine (SYNTHROID) 137 MCG tablet Take 1 tablet (137 mcg total) by mouth daily before breakfast. 90 tablet 1  . MYRBETRIQ 25 MG TB24 tablet Take 1 tablet by mouth once daily 90 tablet 1   No facility-administered medications prior to visit.    ROS Review of Systems  Constitutional: Negative for appetite change, diaphoresis, fatigue and unexpected weight change.  HENT: Negative.   Eyes: Negative for visual disturbance.  Respiratory: Negative for cough, chest tightness, shortness of breath and wheezing.   Cardiovascular: Negative for chest pain, palpitations and leg swelling.  Gastrointestinal: Negative for abdominal pain, constipation and diarrhea.  Endocrine: Positive for polyuria. Negative for cold intolerance and heat intolerance.  Genitourinary: Positive for frequency. Negative for difficulty urinating, dysuria, flank pain, hematuria and urgency.  Musculoskeletal: Negative.   Skin: Negative.   Neurological: Negative.  Negative for  dizziness and weakness.  Hematological: Negative for adenopathy. Does not bruise/bleed easily.  Psychiatric/Behavioral: Negative.     Objective:  BP 116/72   Pulse 64   Temp 98.6 F (37 C) (Oral)   Resp 16   Ht 6\' 2"  (1.88 m)   Wt 207 lb (93.9 kg)   SpO2 98%   BMI 26.58 kg/m   BP Readings from Last 3 Encounters:  11/03/20 116/72  05/13/20 124/78  09/02/19 130/80    Wt Readings from Last 3 Encounters:  11/03/20 207 lb (93.9 kg)  05/13/20 204 lb (92.5 kg)  09/02/19 207 lb (93.9 kg)    Physical Exam Vitals reviewed.  Constitutional:      Appearance: Normal appearance.  HENT:     Nose: Nose normal.     Mouth/Throat:     Mouth: Mucous membranes are moist.  Eyes:     General: No scleral icterus.    Conjunctiva/sclera: Conjunctivae normal.  Cardiovascular:     Rate and Rhythm: Normal rate and regular rhythm.     Heart sounds: No murmur heard.   Pulmonary:     Effort: Pulmonary effort is normal.     Breath sounds: No stridor. No wheezing, rhonchi or rales.  Abdominal:     General: Abdomen is flat. Bowel sounds are normal. There is no distension.     Palpations: Abdomen is soft. There is no hepatomegaly, splenomegaly or mass.  Musculoskeletal:        General: Normal range of motion.     Cervical back: Neck supple.  Lymphadenopathy:     Cervical: No cervical adenopathy.  Skin:    General: Skin is  warm and dry.  Neurological:     General: No focal deficit present.     Mental Status: He is alert.  Psychiatric:        Mood and Affect: Mood normal.        Behavior: Behavior normal.     Lab Results  Component Value Date   WBC 4.6 01/28/2020   HGB 15.2 01/28/2020   HCT 44.2 01/28/2020   PLT 166.0 01/28/2020   GLUCOSE 97 01/28/2020   CHOL 250 (H) 01/28/2020   TRIG 67.0 01/28/2020   HDL 81.20 01/28/2020   LDLCALC 156 (H) 01/28/2020   ALT 25 01/28/2020   AST 21 01/28/2020   NA 136 01/28/2020   K 3.8 01/28/2020   CL 102 01/28/2020   CREATININE 0.93  01/28/2020   BUN 13 01/28/2020   CO2 28 01/28/2020   TSH 2.10 11/03/2020   PSA 2.90 01/28/2020   INR 1.0 02/12/2007   HGBA1C 5.6 07/26/2019    DG Cervical Spine Complete  Result Date: 04/09/2013 *RADIOLOGY REPORT* Clinical Data: Neck and arm pain, no trauma CERVICAL SPINE - COMPLETE 4+ VIEW Comparison: None. Findings: The cervical vertebrae are in normal alignment.  There is degenerative disc disease at C5-6 and C6-7 with some loss of disc space, sclerosis, and spurring.  No prevertebral soft tissue swelling is seen.  There is mild foraminal narrowing at C5-6 with no significant narrowing at C6-7.  The odontoid process is intact. The lung apices are clear. IMPRESSION: Degenerative disc disease at C5-6 and C6-7 with mild foraminal narrowing at C5-6. Original Report Authenticated By: Ivar Drape, M.D.   DG Knee 1-2 Views Left  Result Date: 04/09/2013 *RADIOLOGY REPORT* Clinical Data: Knee pain, no trauma LEFT KNEE - 1-2 VIEW Comparison: None. Findings: There may be very minimal decrease in medial joint space. No significant degenerative spurring is seen although there is a tiny spur emanating from the superior aspect of the patella.  No fracture is seen and no effusion is noted. IMPRESSION: Minimal degenerative change with some loss of medial joint space and minimal patellar spurring. Original Report Authenticated By: Ivar Drape, M.D.   DG Knee 1-2 Views Right  Result Date: 04/09/2013 *RADIOLOGY REPORT* Clinical Data: Knee pain, no trauma RIGHT KNEE - 1-2 VIEW Comparison: None. Findings: Very minimal degenerative spurring is noted at the patellofemoral articulation.  No significant degenerative change is seen.  The joint spaces are relatively well preserved for age.  No effusion is noted. IMPRESSION: Minimal patellar spurring.  No significant degenerative change. Original Report Authenticated By: Ivar Drape, M.D.    Assessment & Plan:   [place] was seen today for hypothyroidism.  Diagnoses and  all orders for this visit:  Adult hypothyroidism- His TSH is in the normal range.  He will stay on the current dose of levothyroxine. -     TSH; Future -     TSH -     levothyroxine (SYNTHROID) 137 MCG tablet; Take 1 tablet (137 mcg total) by mouth daily before breakfast.  OAB (overactive bladder)- Will try a higher dose of Myrbetriq. -     mirabegron ER (MYRBETRIQ) 50 MG TB24 tablet; Take 1 tablet (50 mg total) by mouth daily.   I have discontinued [employer]. I am also having him start on mirabegron ER. Additionally, I am having him maintain his raltegravir, Emtricitab-Rilpivir-Tenofov AF (ODEFSEY PO), and levothyroxine.  Meds ordered this encounter  Medications  . mirabegron ER (MYRBETRIQ) 50 MG TB24 tablet    Sig: Take  1 tablet (50 mg total) by mouth daily.    Dispense:  90 tablet    Refill:  1  . levothyroxine (SYNTHROID) 137 MCG tablet    Sig: Take 1 tablet (137 mcg total) by mouth daily before breakfast.    Dispense:  90 tablet    Refill:  1     Follow-up: Return in about 6 months (around 05/04/2021).  Scarlette Calico, MD

## 2020-11-03 NOTE — Patient Instructions (Signed)
Hypothyroidism  Hypothyroidism is when the thyroid gland does not make enough of certain hormones (it is underactive). The thyroid gland is a small gland located in the lower front part of the neck, just in front of the windpipe (trachea). This gland makes hormones that help control how the body uses food for energy (metabolism) as well as how the heart and brain function. These hormones also play a role in keeping your bones strong. When the thyroid is underactive, it produces too little of the hormones thyroxine (T4) and triiodothyronine (T3). What are the causes? This condition may be caused by:  Hashimoto's disease. This is a disease in which the body's disease-fighting system (immune system) attacks the thyroid gland. This is the most common cause.  Viral infections.  Pregnancy.  Certain medicines.  Birth defects.  Past radiation treatments to the head or neck for cancer.  Past treatment with radioactive iodine.  Past exposure to radiation in the environment.  Past surgical removal of part or all of the thyroid.  Problems with a gland in the center of the brain (pituitary gland).  Lack of enough iodine in the diet. What increases the risk? You are more likely to develop this condition if:  You are male.  You have a family history of thyroid conditions.  You use a medicine called lithium.  You take medicines that affect the immune system (immunosuppressants). What are the signs or symptoms? Symptoms of this condition include:  Feeling as though you have no energy (lethargy).  Not being able to tolerate cold.  Weight gain that is not explained by a change in diet or exercise habits.  Lack of appetite.  Dry skin.  Coarse hair.  Menstrual irregularity.  Slowing of thought processes.  Constipation.  Sadness or depression. How is this diagnosed? This condition may be diagnosed based on:  Your symptoms, your medical history, and a physical exam.  Blood  tests. You may also have imaging tests, such as an ultrasound or MRI. How is this treated? This condition is treated with medicine that replaces the thyroid hormones that your body does not make. After you begin treatment, it may take several weeks for symptoms to go away. Follow these instructions at home:  Take over-the-counter and prescription medicines only as told by your health care provider.  If you start taking any new medicines, tell your health care provider.  Keep all follow-up visits as told by your health care provider. This is important. ? As your condition improves, your dosage of thyroid hormone medicine may change. ? You will need to have blood tests regularly so that your health care provider can monitor your condition. Contact a health care provider if:  Your symptoms do not get better with treatment.  You are taking thyroid replacement medicine and you: ? Sweat a lot. ? Have tremors. ? Feel anxious. ? Lose weight rapidly. ? Cannot tolerate heat. ? Have emotional swings. ? Have diarrhea. ? Feel weak. Get help right away if you have:  Chest pain.  An irregular heartbeat.  A rapid heartbeat.  Difficulty breathing. Summary  Hypothyroidism is when the thyroid gland does not make enough of certain hormones (it is underactive).  When the thyroid is underactive, it produces too little of the hormones thyroxine (T4) and triiodothyronine (T3).  The most common cause is Hashimoto's disease, a disease in which the body's disease-fighting system (immune system) attacks the thyroid gland. The condition can also be caused by viral infections, medicine, pregnancy, or past   radiation treatment to the head or neck.  Symptoms may include weight gain, dry skin, constipation, feeling as though you do not have energy, and not being able to tolerate cold.  This condition is treated with medicine to replace the thyroid hormones that your body does not make. This information  is not intended to replace advice given to you by your health care provider. Make sure you discuss any questions you have with your health care provider. Document Revised: 10/20/2017 Document Reviewed: 10/18/2017 Elsevier Patient Education  2020 [employer]  

## 2020-11-19 ENCOUNTER — Other Ambulatory Visit: Payer: Self-pay | Admitting: Internal Medicine

## 2020-11-19 DIAGNOSIS — N3281 Overactive bladder: Secondary | ICD-10-CM

## 2020-11-26 DIAGNOSIS — Z20822 Contact with and (suspected) exposure to covid-19: Secondary | ICD-10-CM | POA: Diagnosis not present

## 2020-12-14 LAB — CBC WITH AUTO DIFFERENTIAL
Absolute Baso #: 0 10*3/uL (ref 0.0–0.2)
Absolute Eos #: 0.1 10*3/uL (ref 0.0–0.5)
Absolute Lymph #: 2 10*3/uL (ref 1.0–3.2)
Absolute Mono #: 0.6 10*3/uL (ref 0.3–1.0)
Basophils %: 0.4 % (ref 0.0–2.0)
Eosinophils %: 0.8 % (ref 0.0–7.0)
Hematocrit: 49.8 % (ref 38.0–52.0)
Hemoglobin: 17.4 g/dL — ABNORMAL HIGH (ref 13.0–17.3)
Immature Grans (Abs): 0.04 10*3/uL (ref 0.00–0.06)
Immature Granulocytes: 0.5 % (ref 0.1–0.6)
Lymphocytes: 26.2 % (ref 15.0–45.0)
MCH: 31.6 pg (ref 27.0–34.5)
MCHC: 34.9 g/dL (ref 32.0–36.0)
MCV: 90.4 fL (ref 84.0–100.0)
MPV: 10.6 fL (ref 7.2–13.2)
Monocytes: 7.8 % (ref 4.0–12.0)
NRBC Absolute: 0 10*3/uL (ref 0.000–0.012)
NRBC Automated: 0 % (ref 0.0–0.2)
Neutrophils %: 64.3 % (ref 42.0–74.0)
Neutrophils Absolute: 4.8 10*3/uL (ref 1.6–7.3)
Platelets: 267 10*3/uL (ref 140–440)
RBC: 5.51 x10e6/mcL (ref 4.00–5.60)
RDW: 12.8 % (ref 11.0–16.0)
WBC: 7.5 10*3/uL (ref 3.8–10.6)

## 2020-12-14 LAB — LIPID PANEL
Chol/HDL Ratio: 3.6 (ref 0.0–4.4)
Cholesterol: 187 mg/dL (ref 100–200)
HDL: 52 mg/dL (ref >=40)
LDL Cholesterol: 114.4 mg/dL — ABNORMAL HIGH (ref 0.0–100.0)
LDL/HDL Ratio: 2.2
Triglycerides: 103 mg/dL (ref 0–149)
VLDL: 20.6 mg/dL (ref 5.0–40.0)

## 2021-01-15 ENCOUNTER — Encounter: Payer: Self-pay | Admitting: Internal Medicine

## 2021-01-18 ENCOUNTER — Other Ambulatory Visit: Payer: Self-pay | Admitting: Internal Medicine

## 2021-01-18 DIAGNOSIS — N3281 Overactive bladder: Secondary | ICD-10-CM

## 2021-01-18 MED ORDER — GEMTESA 75 MG PO TABS: 1.0000 | ORAL_TABLET | Freq: Every day | ORAL | 1 refills | Status: DC

## 2021-02-08 ENCOUNTER — Ambulatory Visit (INDEPENDENT_AMBULATORY_CARE_PROVIDER_SITE_OTHER): Payer: Medicare Other

## 2021-02-08 ENCOUNTER — Other Ambulatory Visit: Payer: Self-pay

## 2021-02-08 VITALS — BP 118/70 | HR 60 | Temp 98.2°F | Ht 74.0 in | Wt 200.2 lb

## 2021-02-08 DIAGNOSIS — Z0000 Encounter for general adult medical examination without abnormal findings: Secondary | ICD-10-CM

## 2021-02-08 NOTE — Patient Instructions (Signed)
Zachary Levy , Thank you for taking time to come for your Medicare Wellness Visit. I appreciate your ongoing commitment to your health goals. Please review the following plan we discussed and let me know if I can assist you in the future.   Screening recommendations/referrals: Colonoscopy: 12/16/2014; due every 10 years Recommended yearly ophthalmology/optometry visit for glaucoma screening and checkup Recommended yearly dental visit for hygiene and checkup  Vaccinations: Influenza vaccine: 08/16/2020 Pneumococcal vaccine: 03/13/2014, 10/08/2015 Tdap vaccine: 05/18/2017; due every 10 years Shingles vaccine: 05/18/2017, 12/19/2017   Covid-19: 12/12/2019, 01/02/2020, 08/16/2020  Advanced directives: Please bring a copy of your health care power of attorney and living will to the office at your convenience.  Conditions/risks identified: Yes; Reviewed health maintenance screenings with patient today and relevant education, vaccines, and/or referrals were provided. Please continue to do your personal lifestyle choices by: daily care of teeth and gums, regular physical activity (goal should be 5 days a week for 30 minutes), eat a healthy diet, avoid tobacco and drug use, limiting any alcohol intake, taking a low-dose aspirin (if not allergic or have been advised by your provider otherwise) and taking vitamins and minerals as recommended by your provider. Continue doing brain stimulating activities (puzzles, reading, adult coloring books, staying active) to keep memory sharp. Continue to eat heart healthy diet (full of fruits, vegetables, whole grains, lean protein, water--limit salt, fat, and sugar intake) and increase physical activity as tolerated.  Next appointment: Please schedule your next Medicare Wellness Visit with your Nurse Health Advisor in 1 year by calling [phone].  Preventive Care 40 Years and Older, Male Preventive care refers to lifestyle choices and visits with your health care provider  that can promote health and wellness. What does preventive care include?  A yearly physical exam. This is also called an annual well check.  Dental exams once or twice a year.  Routine eye exams. Ask your health care provider how often you should have your eyes checked.  Personal lifestyle choices, including:  Daily care of your teeth and gums.  Regular physical activity.  Eating a healthy diet.  Avoiding tobacco and drug use.  Limiting alcohol use.  Practicing safe sex.  Taking low doses of aspirin every day.  Taking vitamin and mineral supplements as recommended by your health care provider. What happens during an annual well check? The services and screenings done by your health care provider during your annual well check will depend on your age, overall health, lifestyle risk factors, and family history of disease. Counseling  Your health care provider may ask you questions about your:  Alcohol use.  Tobacco use.  Drug use.  Emotional well-being.  Home and relationship well-being.  Sexual activity.  Eating habits.  History of falls.  Memory and ability to understand (cognition).  Work and work Statistician. Screening  You may have the following tests or measurements:  Height, weight, and BMI.  Blood pressure.  Lipid and cholesterol levels. These may be checked every 5 years, or more frequently if you are over 56 years old.  Skin check.  Lung cancer screening. You may have this screening every year starting at age 48 if you have a 30-pack-year history of smoking and currently smoke or have quit within the past 15 years.  Fecal occult blood test (FOBT) of the stool. You may have this test every year starting at age 79.  Flexible sigmoidoscopy or colonoscopy. You may have a sigmoidoscopy every 5 years or a colonoscopy every 10 years starting  at age 45.  Prostate cancer screening. Recommendations will vary depending on your family history and other  risks.  Hepatitis C blood test.  Hepatitis B blood test.  Sexually transmitted disease (STD) testing.  Diabetes screening. This is done by checking your blood sugar (glucose) after you have not eaten for a while (fasting). You may have this done every 1-3 years.  Abdominal aortic aneurysm (AAA) screening. You may need this if you are a current or former smoker.  Osteoporosis. You may be screened starting at age 24 if you are at high risk. Talk with your health care provider about your test results, treatment options, and if necessary, the need for more tests. Vaccines  Your health care provider may recommend certain vaccines, such as:  Influenza vaccine. This is recommended every year.  Tetanus, diphtheria, and acellular pertussis (Tdap, Td) vaccine. You may need a Td booster every 10 years.  Zoster vaccine. You may need this after age 2.  Pneumococcal 13-valent conjugate (PCV13) vaccine. One dose is recommended after age 28.  Pneumococcal polysaccharide (PPSV23) vaccine. One dose is recommended after age 41. Talk to your health care provider about which screenings and vaccines you need and how often you need them. This information is not intended to replace advice given to you by your health care provider. Make sure you discuss any questions you have with your health care provider. Document Released: 12/04/2015 Document Revised: 07/27/2016 Document Reviewed: 09/08/2015 Elsevier Interactive Patient Education  2017 [employer] Prevention in the Home Falls can cause injuries. They can happen to people of all ages. There are many things you can do to make your home safe and to help prevent falls. What can I do on the outside of my home?  Regularly fix the edges of walkways and driveways and fix any cracks.  Remove anything that might make you trip as you walk through a door, such as a raised step or threshold.  Trim any bushes or trees on the path to your home.  Use  bright outdoor lighting.  Clear any walking paths of anything that might make someone trip, such as rocks or tools.  Regularly check to see if handrails are loose or broken. Make sure that both sides of any steps have handrails.  Any raised decks and porches should have guardrails on the edges.  Have any leaves, snow, or ice cleared regularly.  Use sand or salt on walking paths during winter.  Clean up any spills in your garage right away. This includes oil or grease spills. What can I do in the bathroom?  Use night lights.  Install grab bars by the toilet and in the tub and shower. Do not use towel bars as grab bars.  Use non-skid mats or decals in the tub or shower.  If you need to sit down in the shower, use a plastic, non-slip stool.  Keep the floor dry. Clean up any water that spills on the floor as soon as it happens.  Remove soap buildup in the tub or shower regularly.  Attach bath mats securely with double-sided non-slip rug tape.  Do not have throw rugs and other things on the floor that can make you trip. What can I do in the bedroom?  Use night lights.  Make sure that you have a light by your bed that is easy to reach.  Do not use any sheets or blankets that are too big for your bed. They should not hang down onto  the floor.  Have a firm chair that has side arms. You can use this for support while you get dressed.  Do not have throw rugs and other things on the floor that can make you trip. What can I do in the kitchen?  Clean up any spills right away.  Avoid walking on wet floors.  Keep items that you use a lot in easy-to-reach places.  If you need to reach something above you, use a strong step stool that has a grab bar.  Keep electrical cords out of the way.  Do not use floor polish or wax that makes floors slippery. If you must use wax, use non-skid floor wax.  Do not have throw rugs and other things on the floor that can make you trip. What can  I do with my stairs?  Do not leave any items on the stairs.  Make sure that there are handrails on both sides of the stairs and use them. Fix handrails that are broken or loose. Make sure that handrails are as long as the stairways.  Check any carpeting to make sure that it is firmly attached to the stairs. Fix any carpet that is loose or worn.  Avoid having throw rugs at the top or bottom of the stairs. If you do have throw rugs, attach them to the floor with carpet tape.  Make sure that you have a light switch at the top of the stairs and the bottom of the stairs. If you do not have them, ask someone to add them for you. What else can I do to help prevent falls?  Wear shoes that:  Do not have high heels.  Have rubber bottoms.  Are comfortable and fit you well.  Are closed at the toe. Do not wear sandals.  If you use a stepladder:  Make sure that it is fully opened. Do not climb a closed stepladder.  Make sure that both sides of the stepladder are locked into place.  Ask someone to hold it for you, if possible.  Clearly mark and make sure that you can see:  Any grab bars or handrails.  First and last steps.  Where the edge of each step is.  Use tools that help you move around (mobility aids) if they are needed. These include:  Canes.  Walkers.  Scooters.  Crutches.  Turn on the lights when you go into a dark area. Replace any light bulbs as soon as they burn out.  Set up your furniture so you have a clear path. Avoid moving your furniture around.  If any of your floors are uneven, fix them.  If there are any pets around you, be aware of where they are.  Review your medicines with your doctor. Some medicines can make you feel dizzy. This can increase your chance of falling. Ask your doctor what other things that you can do to help prevent falls. This information is not intended to replace advice given to you by your health care provider. Make sure you  discuss any questions you have with your health care provider. Document Released: 09/03/2009 Document Revised: 04/14/2016 Document Reviewed: 12/12/2014 Elsevier Interactive Patient Education  2017 [employer].

## 2021-02-08 NOTE — Progress Notes (Signed)
Subjective:   Zachary Levy is a 75 y.o. male who presents for Medicare Annual/Subsequent preventive examination.  Review of Systems    No ROS. Medicare Wellness Visit. Additional risk factors are reflected in social history. Cardiac Risk Factors include: advanced age (>71men, >21 women);dyslipidemia;male gender     Objective:    Today's Vitals   02/08/21 0822  BP: 118/70  Pulse: 60  Temp: 98.2 F (36.8 C)  SpO2: 98%  Weight: 200 lb 3.2 oz (90.8 kg)  Height: 6\' 2"  (1.88 m)  PainSc: 0-No pain   Body mass index is 25.7 kg/m.  Advanced Directives 02/08/2021 08/13/2018 07/07/2017  Does Patient Have a Medical Advance Directive? Yes Yes Yes  Type of Advance Directive Living will [place];Living will [place];Living will  Does patient want to make changes to medical advance directive? No - Patient declined - -  Copy of [place] in Chart? No - copy requested No - copy requested No - copy requested    Current Medications (verified) Outpatient Encounter Medications as of 02/08/2021  Medication Sig  . Emtricitab-Rilpivir-Tenofov AF (ODEFSEY PO) Take by mouth.  . levothyroxine (SYNTHROID) 137 MCG tablet Take 1 tablet (137 mcg total) by mouth daily before breakfast.  . raltegravir (ISENTRESS) 400 MG tablet Take 400 mg by mouth 2 (two) times daily.  . Vibegron (GEMTESA) 75 MG TABS Take 1 tablet by mouth daily.   No facility-administered encounter medications on file as of 02/08/2021.    Allergies (verified) Patient has no known allergies.   History: Past Medical History:  Diagnosis Date  . HIV infection ([place])   . Hypothyroid    History reviewed. No pertinent surgical history. Family History  Problem Relation Age of Onset  . Cancer Sister   . Cancer Brother    Social History   Socioeconomic History  . Marital status: Divorced    Spouse name: Not on file  . Number of children: Not on file  . Years of  education: Not on file  . Highest education level: Not on file  Occupational History  . Not on file  Tobacco Use  . Smoking status: Never Smoker  . Smokeless tobacco: Never Used  Vaping Use  . Vaping Use: Never used  Substance and Sexual Activity  . Alcohol use: Yes    Alcohol/week: 1.0 standard drink    Types: 1 Cans of beer per week    Comment: 1-2 glass wine few times weekly  . Drug use: No  . Sexual activity: Not Currently    Partners: Male  Other Topics Concern  . Not on file  Social History Narrative   Lives with his husband   Social Determinants of Health   Financial Resource Strain: Low Risk   . Difficulty of Paying Living Expenses: Not hard at all  Food Insecurity: No Food Insecurity  . Worried About Charity fundraiser in the Last Year: Never true  . Ran Out of Food in the Last Year: Never true  Transportation Needs: No Transportation Needs  . Lack of Transportation (Medical): No  . Lack of Transportation (Non-Medical): No  Physical Activity: Sufficiently Active  . Days of Exercise per Week: 5 days  . Minutes of Exercise per Session: 30 min  Stress: No Stress Concern Present  . Feeling of Stress : Not at all  Social Connections: Socially Integrated  . Frequency of Communication with Friends and Family: More than three times a week  .  Frequency of Social Gatherings with Friends and Family: More than three times a week  . Attends Religious Services: More than 4 times per year  . Active Member of Clubs or Organizations: Yes  . Attends Archivist Meetings: More than 4 times per year  . Marital Status: Married    Tobacco Counseling Counseling given: Not Answered   Clinical Intake:  Pre-visit preparation completed: Yes  Pain : No/denies pain Pain Score: 0-No pain     BMI - recorded: 25.7 Nutritional Status: BMI 25 -29 Overweight Nutritional Risks: None Diabetes: No  How often do you need to have someone help you when you read instructions,  pamphlets, or other written materials from your doctor or pharmacy?: 1 - Never What is the last grade level you completed in school?: Master's Degree  Diabetic? no     Information entered by :: Lisette Abu, LPN   Activities of Daily Living In your present state of health, do you have any difficulty performing the following activities: 02/08/2021 11/03/2020  Hearing? Y N  Vision? N N  Difficulty concentrating or making decisions? N N  Walking or climbing stairs? N N  Dressing or bathing? N N  Doing errands, shopping? N N  Preparing Food and eating ? N -  Using the Toilet? N -  In the past six months, have you accidently leaked urine? N -  Do you have problems with loss of bowel control? N -  Managing your Medications? N -  Managing your Finances? N -  Housekeeping or managing your Housekeeping? N -  Some recent data might be hidden    Patient Care Team: Janith Lima, MD as PCP - General (Internal Medicine)  Indicate any recent Medical Services you may have received from other than [facility] providers in the past year (date may be approximate).     Assessment:   This is a routine wellness examination for Zachary Levy.  Hearing/Vision screen No exam data present  Dietary issues and exercise activities discussed: Current Exercise Habits: Home exercise routine, Type of exercise: walking;strength training/weights;stretching;treadmill;Other - see comments (bike riding), Time (Minutes): 30, Frequency (Times/Week): 5, Weekly Exercise (Minutes/Week): 150, Intensity: Moderate, Exercise limited by: None identified  Goals    . Patient Stated     I want get my knee as strong as possible. Stay as healthy and as independent as possible by continuing to exercise, eat healthy, travel, enjoy life.      Depression Screen PHQ 2/9 Scores 02/08/2021 11/03/2020 09/02/2019 08/13/2018 07/07/2017 05/26/2016  PHQ - 2 Score 0 0 0 0 0 0    Fall Risk Fall Risk  02/08/2021 11/03/2020 09/02/2019 08/13/2018  07/07/2017  Falls in the past year? 0 0 0 No No  Number falls in past yr: 0 - 0 - -  Injury with Fall? 0 - 0 - -  Risk for fall due to : No Fall Risks - - - -  Follow up Falls evaluation completed - Falls evaluation completed - -    FALL RISK PREVENTION PERTAINING TO THE HOME:  Any stairs in or around the home? Yes  If so, are there any without handrails? No  Home free of loose throw rugs in walkways, pet beds, electrical cords, etc? Yes  Adequate lighting in your home to reduce risk of falls? Yes   ASSISTIVE DEVICES UTILIZED TO PREVENT FALLS:  Life alert? No  Use of a cane, walker or w/c? No  Grab bars in the bathroom? No  Shower chair  or bench in shower? No  Elevated toilet seat or a handicapped toilet? Yes   TIMED UP AND GO:  Was the test performed? No .  Length of time to ambulate 10 feet: 0 sec.   Gait steady and fast without use of assistive device  Cognitive Function: Normal cognitive status assessed by direct observation by this Nurse Health Advisor. No abnormalities found.          Immunizations Immunization History  Administered Date(s) Administered  . Hep A / Hep B 08/17/2007, 12/13/2007, 03/13/2008  . Hepatitis B 11/21/1996, 12/22/1996, 01/19/1997  . Influenza Whole 10/23/2006  . Influenza, High Dose Seasonal PF 09/05/2017, 08/13/2018, 08/20/2019  . Influenza, Seasonal, Injecte, Preservative Fre 07/31/2008, 08/25/2011  . Influenza,inj,Quad PF,6+ Mos 08/22/2013, 10/08/2015  . Influenza-Unspecified 08/25/2014, 09/05/2016  . PFIZER(Purple Top)SARS-COV-2 Vaccination 12/12/2019, 01/02/2020  . Pneumococcal Conjugate-13 03/13/2014  . Pneumococcal Polysaccharide-23 12/23/1998, 10/08/2015  . Pneumococcal-Unspecified 08/16/2007  . Td 05/18/2017  . Tdap 05/18/2017  . Zoster Recombinat (Shingrix) 05/18/2017, 12/19/2017    TDAP status: Up to date  Flu Vaccine status: Up to date  Pneumococcal vaccine status: Up to date  Covid-19 vaccine status: Completed  vaccines  Qualifies for Shingles Vaccine? Yes   Zostavax completed Yes   Shingrix Completed?: Yes  Screening Tests Health Maintenance  Topic Date Due  . COVID-19 Vaccine (3 - Pfizer risk 4-dose series) 01/30/2020  . COLONOSCOPY (Pts 45-39yrs Insurance coverage will need to be confirmed)  12/16/2024  . TETANUS/TDAP  05/19/2027  . INFLUENZA VACCINE  Completed  . Hepatitis C Screening  Completed  . PNA vac Low Risk Adult  Completed  . HPV VACCINES  Aged Out    Health Maintenance  Health Maintenance Due  Topic Date Due  . COVID-19 Vaccine (3 - Pfizer risk 4-dose series) 01/30/2020    Colorectal cancer screening: Type of screening: Colonoscopy. Completed 12/16/2014. Repeat every 10 years  Lung Cancer Screening: (Low Dose CT Chest recommended if Age 24-80 years, 30 pack-year currently smoking OR have quit w/in 15years.) does not qualify.   Lung Cancer Screening Referral: no  Additional Screening:  Hepatitis C Screening: does qualify; Completed yes  Vision Screening: Recommended annual ophthalmology exams for early detection of glaucoma and other disorders of the eye. Is the patient up to date with their annual eye exam?  Yes  Who is the provider or what is the name of the office in which the patient attends annual eye exams? Sharyne Peach, MD. If pt is not established with a provider, would they like to be referred to a provider to establish care? No .   Dental Screening: Recommended annual dental exams for proper oral hygiene  Community Resource Referral / Chronic Care Management: CRR required this visit?  No   CCM required this visit?  No      Plan:     I have personally reviewed and noted the following in the patient's chart:   . Medical and social history . Use of alcohol, tobacco or illicit drugs  . Current medications and supplements . Functional ability and status . Nutritional status . Physical activity . Advanced directives . List of other  physicians . Hospitalizations, surgeries, and ER visits in previous 12 months . Vitals . Screenings to include cognitive, depression, and falls . Referrals and appointments  In addition, I have reviewed and discussed with patient certain preventive protocols, quality metrics, and best practice recommendations. A written personalized care plan for preventive services as well as general preventive health recommendations  were provided to patient.     Sheral Flow, LPN   [id number]   Nurse Notes:  Medications reviewed with patient; no opioid use noted.

## 2021-03-08 ENCOUNTER — Encounter: Payer: Self-pay | Admitting: Internal Medicine

## 2021-03-10 ENCOUNTER — Other Ambulatory Visit: Payer: Self-pay | Admitting: Internal Medicine

## 2021-03-10 DIAGNOSIS — Z21 Asymptomatic human immunodeficiency virus [HIV] infection status: Secondary | ICD-10-CM

## 2021-03-10 DIAGNOSIS — E039 Hypothyroidism, unspecified: Secondary | ICD-10-CM

## 2021-03-10 DIAGNOSIS — E785 Hyperlipidemia, unspecified: Secondary | ICD-10-CM

## 2021-03-12 ENCOUNTER — Other Ambulatory Visit (INDEPENDENT_AMBULATORY_CARE_PROVIDER_SITE_OTHER): Payer: Medicare Other

## 2021-03-12 DIAGNOSIS — Z21 Asymptomatic human immunodeficiency virus [HIV] infection status: Secondary | ICD-10-CM

## 2021-03-12 DIAGNOSIS — E039 Hypothyroidism, unspecified: Secondary | ICD-10-CM

## 2021-03-12 DIAGNOSIS — E785 Hyperlipidemia, unspecified: Secondary | ICD-10-CM | POA: Diagnosis not present

## 2021-03-12 LAB — CBC WITH DIFFERENTIAL/PLATELET
Basophils Absolute: 0.1 10*3/uL (ref 0.0–0.1)
Basophils Relative: 1.9 % (ref 0.0–3.0)
Eosinophils Absolute: 0.1 10*3/uL (ref 0.0–0.7)
Eosinophils Relative: 3.1 % (ref 0.0–5.0)
HCT: 44.7 % (ref 39.0–52.0)
Hemoglobin: 15.1 g/dL (ref 13.0–17.0)
Lymphocytes Relative: 28 % (ref 12.0–46.0)
Lymphs Abs: 1.2 10*3/uL (ref 0.7–4.0)
MCHC: 33.7 g/dL (ref 30.0–36.0)
MCV: 96.8 fl (ref 78.0–100.0)
Monocytes Absolute: 0.4 10*3/uL (ref 0.1–1.0)
Monocytes Relative: 10.1 % (ref 3.0–12.0)
Neutro Abs: 2.5 10*3/uL (ref 1.4–7.7)
Neutrophils Relative %: 56.9 % (ref 43.0–77.0)
Platelets: 168 10*3/uL (ref 150.0–400.0)
RBC: 4.62 Mil/uL (ref 4.22–5.81)
RDW: 13.6 % (ref 11.5–15.5)
WBC: 4.3 10*3/uL (ref 4.0–10.5)

## 2021-03-12 LAB — HEPATIC FUNCTION PANEL
ALT: 19 U/L (ref 0–53)
AST: 18 U/L (ref 0–37)
Albumin: 3.9 g/dL (ref 3.5–5.2)
Alkaline Phosphatase: 61 U/L (ref 39–117)
Bilirubin, Direct: 0.1 mg/dL (ref 0.0–0.3)
Total Bilirubin: 0.7 mg/dL (ref 0.2–1.2)
Total Protein: 6.6 g/dL (ref 6.0–8.3)

## 2021-03-12 LAB — TSH: TSH: 1.5 u[IU]/mL (ref 0.35–4.50)

## 2021-03-12 LAB — BASIC METABOLIC PANEL
BUN: 23 mg/dL (ref 6–23)
CO2: 24 mEq/L (ref 19–32)
Calcium: 8.9 mg/dL (ref 8.4–10.5)
Chloride: 107 mEq/L (ref 96–112)
Creatinine, Ser: 0.88 mg/dL (ref 0.40–1.50)
GFR: 85.67 mL/min (ref >60.00)
Glucose, Bld: 104 mg/dL — ABNORMAL HIGH (ref 70–99)
Potassium: 4.3 mEq/L (ref 3.5–5.1)
Sodium: 139 mEq/L (ref 135–145)

## 2021-03-13 LAB — T-HELPER CELLS (CD4) COUNT (NOT AT ARMC)
% CD 4 Pos. Lymph.: 39.2 % (ref 30.8–58.5)
Absolute CD 4 Helper: 510 /uL (ref 359–1519)
Basophils Absolute: 0.1 10*3/uL (ref 0.0–0.2)
Basos: 2 %
EOS (ABSOLUTE): 0.2 10*3/uL (ref 0.0–0.4)
Eos: 3 %
Hematocrit: 45.3 % (ref 37.5–51.0)
Hemoglobin: 15.4 g/dL (ref 13.0–17.7)
Immature Grans (Abs): 0 10*3/uL (ref 0.0–0.1)
Immature Granulocytes: 0 %
Lymphocytes Absolute: 1.3 10*3/uL (ref 0.7–3.1)
Lymphs: 29 %
MCH: 32.4 pg (ref 26.6–33.0)
MCHC: 34 g/dL (ref 31.5–35.7)
MCV: 95 fL (ref 79–97)
Monocytes Absolute: 0.5 10*3/uL (ref 0.1–0.9)
Monocytes: 11 %
Neutrophils Absolute: 2.5 10*3/uL (ref 1.4–7.0)
Neutrophils: 55 %
Platelets: 177 10*3/uL (ref 150–450)
RBC: 4.76 x10E6/uL (ref 4.14–5.80)
RDW: 13.2 % (ref 11.6–15.4)
WBC: 4.6 10*3/uL (ref 3.4–10.8)

## 2021-03-15 LAB — QUANTIFERON-TB GOLD PLUS
Mitogen-NIL: >10 IU/mL
NIL: 0.03 IU/mL
QuantiFERON-TB Gold Plus: NEGATIVE
TB1-NIL: 0 IU/mL
TB2-NIL: 0 IU/mL

## 2021-03-15 LAB — HEPATITIS C ANTIBODY
Hepatitis C Ab: NONREACTIVE
SIGNAL TO CUT-OFF: 0.01 (ref <1.00)

## 2021-03-15 LAB — HIV-1 RNA QUANT-NO REFLEX-BLD
HIV 1 RNA Quant: <20 Copies/mL — ABNORMAL HIGH
HIV-1 RNA Quant, Log: <1.3 Log cps/mL — ABNORMAL HIGH

## 2021-03-15 LAB — RPR: RPR Ser Ql: NONREACTIVE

## 2021-04-01 DIAGNOSIS — Z7189 Other specified counseling: Secondary | ICD-10-CM | POA: Diagnosis not present

## 2021-04-01 DIAGNOSIS — E785 Hyperlipidemia, unspecified: Secondary | ICD-10-CM | POA: Diagnosis not present

## 2021-04-01 DIAGNOSIS — B20 Human immunodeficiency virus [HIV] disease: Secondary | ICD-10-CM | POA: Diagnosis not present

## 2021-04-01 DIAGNOSIS — Z6826 Body mass index (BMI) 26.0-26.9, adult: Secondary | ICD-10-CM | POA: Diagnosis not present

## 2021-04-01 DIAGNOSIS — Z7185 Encounter for immunization safety counseling: Secondary | ICD-10-CM | POA: Diagnosis not present

## 2021-04-01 DIAGNOSIS — E039 Hypothyroidism, unspecified: Secondary | ICD-10-CM | POA: Diagnosis not present

## 2021-05-05 ENCOUNTER — Encounter: Payer: Self-pay | Admitting: Internal Medicine

## 2021-05-06 ENCOUNTER — Other Ambulatory Visit: Payer: Self-pay | Admitting: Internal Medicine

## 2021-05-06 DIAGNOSIS — E785 Hyperlipidemia, unspecified: Secondary | ICD-10-CM

## 2021-05-06 DIAGNOSIS — N400 Enlarged prostate without lower urinary tract symptoms: Secondary | ICD-10-CM

## 2021-05-06 DIAGNOSIS — E039 Hypothyroidism, unspecified: Secondary | ICD-10-CM

## 2021-05-06 DIAGNOSIS — R739 Hyperglycemia, unspecified: Secondary | ICD-10-CM

## 2021-05-11 ENCOUNTER — Other Ambulatory Visit (INDEPENDENT_AMBULATORY_CARE_PROVIDER_SITE_OTHER): Payer: Medicare Other

## 2021-05-11 ENCOUNTER — Ambulatory Visit: Payer: Medicare Other | Admitting: Internal Medicine

## 2021-05-11 DIAGNOSIS — E039 Hypothyroidism, unspecified: Secondary | ICD-10-CM

## 2021-05-11 DIAGNOSIS — N400 Enlarged prostate without lower urinary tract symptoms: Secondary | ICD-10-CM

## 2021-05-11 DIAGNOSIS — E785 Hyperlipidemia, unspecified: Secondary | ICD-10-CM

## 2021-05-11 DIAGNOSIS — R739 Hyperglycemia, unspecified: Secondary | ICD-10-CM | POA: Diagnosis not present

## 2021-05-11 LAB — PSA: PSA: 3.09 ng/mL (ref 0.10–4.00)

## 2021-05-11 LAB — BASIC METABOLIC PANEL
BUN: 20 mg/dL (ref 6–23)
CO2: 23 mEq/L (ref 19–32)
Calcium: 9.3 mg/dL (ref 8.4–10.5)
Chloride: 105 mEq/L (ref 96–112)
Creatinine, Ser: 0.9 mg/dL (ref 0.40–1.50)
GFR: 84.99 mL/min (ref >60.00)
Glucose, Bld: 103 mg/dL — ABNORMAL HIGH (ref 70–99)
Potassium: 4 mEq/L (ref 3.5–5.1)
Sodium: 137 mEq/L (ref 135–145)

## 2021-05-11 LAB — LIPID PANEL
Cholesterol: 231 mg/dL — ABNORMAL HIGH (ref 0–200)
HDL: 72 mg/dL (ref >39.00)
LDL Cholesterol: 148 mg/dL — ABNORMAL HIGH (ref 0–99)
NonHDL: 158.61
Total CHOL/HDL Ratio: 3
Triglycerides: 55 mg/dL (ref 0.0–149.0)
VLDL: 11 mg/dL (ref 0.0–40.0)

## 2021-05-11 LAB — TSH: TSH: 1.15 u[IU]/mL (ref 0.35–4.50)

## 2021-05-11 LAB — HEMOGLOBIN A1C: Hgb A1c MFr Bld: 5.7 % (ref 4.6–6.5)

## 2021-05-14 ENCOUNTER — Other Ambulatory Visit: Payer: Self-pay | Admitting: Internal Medicine

## 2021-05-14 DIAGNOSIS — E039 Hypothyroidism, unspecified: Secondary | ICD-10-CM

## 2021-05-14 DIAGNOSIS — N3281 Overactive bladder: Secondary | ICD-10-CM

## 2021-05-17 ENCOUNTER — Other Ambulatory Visit: Payer: Self-pay | Admitting: Internal Medicine

## 2021-05-17 DIAGNOSIS — N3281 Overactive bladder: Secondary | ICD-10-CM

## 2021-05-17 DIAGNOSIS — E039 Hypothyroidism, unspecified: Secondary | ICD-10-CM

## 2021-05-17 MED ORDER — LEVOTHYROXINE SODIUM 137 MCG PO TABS: 137.0000 ug | ORAL_TABLET | Freq: Every day | ORAL | 0 refills | Status: DC

## 2021-05-18 ENCOUNTER — Other Ambulatory Visit: Payer: Self-pay | Admitting: Internal Medicine

## 2021-05-18 DIAGNOSIS — N3281 Overactive bladder: Secondary | ICD-10-CM

## 2021-05-18 DIAGNOSIS — N400 Enlarged prostate without lower urinary tract symptoms: Secondary | ICD-10-CM

## 2021-05-18 MED ORDER — MIRABEGRON ER 50 MG PO TB24: 50.0000 mg | ORAL_TABLET | Freq: Every day | ORAL | 0 refills | Status: DC

## 2021-05-31 DIAGNOSIS — N5201 Erectile dysfunction due to arterial insufficiency: Secondary | ICD-10-CM | POA: Diagnosis not present

## 2021-05-31 DIAGNOSIS — R351 Nocturia: Secondary | ICD-10-CM | POA: Diagnosis not present

## 2021-06-08 ENCOUNTER — Encounter: Payer: Self-pay | Admitting: Internal Medicine

## 2021-06-08 ENCOUNTER — Other Ambulatory Visit: Payer: Self-pay

## 2021-06-08 ENCOUNTER — Ambulatory Visit (INDEPENDENT_AMBULATORY_CARE_PROVIDER_SITE_OTHER): Payer: Medicare Other | Admitting: Internal Medicine

## 2021-06-08 VITALS — BP 112/74 | HR 68 | Temp 98.2°F | Ht 74.0 in | Wt 201.0 lb

## 2021-06-08 DIAGNOSIS — E785 Hyperlipidemia, unspecified: Secondary | ICD-10-CM

## 2021-06-08 DIAGNOSIS — R739 Hyperglycemia, unspecified: Secondary | ICD-10-CM

## 2021-06-08 DIAGNOSIS — E039 Hypothyroidism, unspecified: Secondary | ICD-10-CM | POA: Diagnosis not present

## 2021-06-08 NOTE — Progress Notes (Signed)
Subjective:  Patient ID: Zachary Levy, male    DOB: [birth year]  Age: 75 y.o. MRN: [id number]  CC: Hypothyroidism and Hyperlipidemia  This visit occurred during the SARS-CoV-2 public health emergency.  Safety protocols were in place, including screening questions prior to the visit, additional usage of staff PPE, and extensive cleaning of exam room while observing appropriate contact time as indicated for disinfecting solutions.    HPI Zachary Levy presents for f/up -  He recently took an extended bike trip.  He tells me that over several weeks he biked over 450 miles.  His exercise tolerance was excellent.  He does not experience dyspnea on exertion, chest pain, diaphoresis, dizziness, lightheadedness, palpitations, edema, or fatigue.  Outpatient Medications Prior to Visit  Medication Sig Dispense Refill   Emtricitab-Rilpivir-Tenofov AF (ODEFSEY PO) Take by mouth.     levothyroxine (SYNTHROID) 137 MCG tablet Take 1 tablet (137 mcg total) by mouth daily before breakfast. 90 tablet 0   mirabegron ER (MYRBETRIQ) 50 MG TB24 tablet Take 1 tablet (50 mg total) by mouth daily. 90 tablet 0   raltegravir (ISENTRESS) 400 MG tablet Take 400 mg by mouth 2 (two) times daily.     No facility-administered medications prior to visit.    ROS Review of Systems  Constitutional:  Negative for diaphoresis, fatigue and unexpected weight change.  Respiratory:  Negative for cough, shortness of breath and wheezing.   Cardiovascular:  Negative for chest pain, palpitations and leg swelling.  Gastrointestinal:  Negative for abdominal pain, constipation and diarrhea.  Endocrine: Negative for cold intolerance and heat intolerance.  Genitourinary: Negative.  Negative for difficulty urinating.  Musculoskeletal:  Negative for arthralgias and myalgias.  Skin: Negative.  Negative for color change and pallor.  Neurological: Negative.  Negative for dizziness, weakness and light-headedness.  Hematological:  Negative  for adenopathy. Does not bruise/bleed easily.  Psychiatric/Behavioral: Negative.     Objective:  BP 112/74 (BP Location: Left Arm, Patient Position: Sitting, Cuff Size: Large)   Pulse 68   Temp 98.2 F (36.8 C) (Oral)   Ht 6\' 2"  (1.88 m)   Wt 201 lb (91.2 kg)   SpO2 97%   BMI 25.81 kg/m   BP Readings from Last 3 Encounters:  06/08/21 112/74  02/08/21 118/70  11/03/20 116/72    Wt Readings from Last 3 Encounters:  06/08/21 201 lb (91.2 kg)  02/08/21 200 lb 3.2 oz (90.8 kg)  11/03/20 207 lb (93.9 kg)    Physical Exam Vitals reviewed.  HENT:     Nose: Nose normal.     Mouth/Throat:     Mouth: Mucous membranes are moist.  Eyes:     Conjunctiva/sclera: Conjunctivae normal.  Cardiovascular:     Rate and Rhythm: Normal rate and regular rhythm.     Heart sounds: No murmur heard. Pulmonary:     Effort: Pulmonary effort is normal.     Breath sounds: Normal breath sounds. No rhonchi or rales.  Abdominal:     General: Abdomen is flat. Bowel sounds are normal.     Palpations: There is no mass.     Tenderness: There is no abdominal tenderness.  Musculoskeletal:        General: Normal range of motion.     Cervical back: Neck supple.     Right lower leg: No edema.     Left lower leg: No edema.  Skin:    General: Skin is warm and dry.  Neurological:     General: No focal deficit  present.     Mental Status: He is alert.    Lab Results  Component Value Date   WBC 4.6 03/12/2021   WBC 4.3 03/12/2021   HGB 15.4 03/12/2021   HGB 15.1 03/12/2021   HCT 45.3 03/12/2021   HCT 44.7 03/12/2021   PLT 177 03/12/2021   PLT 168.0 03/12/2021   GLUCOSE 103 (H) 05/11/2021   CHOL 231 (H) 05/11/2021   TRIG 55.0 05/11/2021   HDL 72.00 05/11/2021   LDLCALC 148 (H) 05/11/2021   ALT 19 03/12/2021   AST 18 03/12/2021   NA 137 05/11/2021   K 4.0 05/11/2021   CL 105 05/11/2021   CREATININE 0.90 05/11/2021   BUN 20 05/11/2021   CO2 23 05/11/2021   TSH 1.15 05/11/2021   PSA 3.09  05/11/2021   INR 1.0 02/12/2007   HGBA1C 5.7 05/11/2021    DG Cervical Spine Complete  Result Date: 04/09/2013 *RADIOLOGY REPORT* Clinical Data: Neck and arm pain, no trauma CERVICAL SPINE - COMPLETE 4+ VIEW Comparison: None. Findings: The cervical vertebrae are in normal alignment.  There is degenerative disc disease at C5-6 and C6-7 with some loss of disc space, sclerosis, and spurring.  No prevertebral soft tissue swelling is seen.  There is mild foraminal narrowing at C5-6 with no significant narrowing at C6-7.  The odontoid process is intact. The lung apices are clear. IMPRESSION: Degenerative disc disease at C5-6 and C6-7 with mild foraminal narrowing at C5-6. Original Report Authenticated By: Ivar Drape, M.D.   DG Knee 1-2 Views Left  Result Date: 04/09/2013 *RADIOLOGY REPORT* Clinical Data: Knee pain, no trauma LEFT KNEE - 1-2 VIEW Comparison: None. Findings: There may be very minimal decrease in medial joint space. No significant degenerative spurring is seen although there is a tiny spur emanating from the superior aspect of the patella.  No fracture is seen and no effusion is noted. IMPRESSION: Minimal degenerative change with some loss of medial joint space and minimal patellar spurring. Original Report Authenticated By: Ivar Drape, M.D.   DG Knee 1-2 Views Right  Result Date: 04/09/2013 *RADIOLOGY REPORT* Clinical Data: Knee pain, no trauma RIGHT KNEE - 1-2 VIEW Comparison: None. Findings: Very minimal degenerative spurring is noted at the patellofemoral articulation.  No significant degenerative change is seen.  The joint spaces are relatively well preserved for age.  No effusion is noted. IMPRESSION: Minimal patellar spurring.  No significant degenerative change. Original Report Authenticated By: Ivar Drape, M.D.    Assessment & Plan:   Zachary Levy was seen today for hypothyroidism and hyperlipidemia.  Diagnoses and all orders for this visit:  Adult hypothyroidism- His recent TSH was  in the normal range.  He will stay on the current dose of levothyroxine.  Dyslipidemia, goal LDL below 130- He is not willing to take a statin for CV risk reduction.  Hyperglycemia- His recent A1c was normal.  I am having Zachary Levy maintain his raltegravir, Emtricitab-Rilpivir-Tenofov AF (ODEFSEY PO), levothyroxine, and mirabegron ER.  No orders of the defined types were placed in this encounter.    Follow-up: No follow-ups on file.  Scarlette Calico, MD

## 2021-06-09 ENCOUNTER — Encounter: Payer: Self-pay | Admitting: Internal Medicine

## 2021-06-16 LAB — LIPID PANEL
Chol/HDL Ratio: 2.4 (ref 0.0–4.4)
Cholesterol: 113 mg/dL (ref 100–200)
HDL: 48 mg/dL (ref >=40)
LDL Cholesterol: 37.4 mg/dL (ref 0.0–100.0)
LDL/HDL Ratio: 0.8
Triglycerides: 138 mg/dL (ref 0–149)
VLDL: 27.6 mg/dL (ref 5.0–40.0)

## 2021-06-16 LAB — COMPREHENSIVE METABOLIC PANEL
ALT: 31 U/L (ref 0–50)
AST: 23 U/L (ref 0–50)
Albumin/Globulin Ratio: 2 (ref 1.00–2.70)
Albumin: 4.3 g/dL (ref 3.5–5.2)
Alk Phosphatase: 67 U/L (ref 40–130)
Anion Gap: 11 mmol/L (ref 2–17)
BUN: 17 mg/dL (ref 8–23)
Bun/Cre Ratio: 15.5 (ref 6.0–17.0)
CO2: 29 mmol/L (ref 22–29)
Calcium: 9.2 mg/dL (ref 8.8–10.2)
Chloride: 105 mmol/L (ref 98–107)
Creatinine: 1.1 mg/dL (ref 0.7–1.3)
Est, Glom Filt Rate: 71 mL/min/1.73m?? — ABNORMAL LOW (ref >=90)
Globulin: 2.1 g/dL (ref 1.9–4.4)
Glucose: 101 mg/dL — ABNORMAL HIGH (ref 70–99)
OSMOLALITY CALCULATED: 290 mOsm/kg — ABNORMAL HIGH (ref 270–287)
Potassium: 4.2 mmol/L (ref 3.5–5.3)
Sodium: 145 mmol/L (ref 135–145)
Total Bilirubin: 0.41 mg/dL (ref 0.00–1.20)
Total Protein: 6.4 g/dL (ref 6.4–8.3)

## 2021-06-16 LAB — CBC WITH AUTO DIFFERENTIAL
Absolute Baso #: 0 10*3/uL (ref 0.0–0.2)
Absolute Eos #: 0.1 10*3/uL (ref 0.0–0.5)
Absolute Lymph #: 1.8 10*3/uL (ref 1.0–3.2)
Absolute Mono #: 0.5 10*3/uL (ref 0.3–1.0)
Basophils %: 0.4 % (ref 0.0–2.0)
Eosinophils %: 1.3 % (ref 0.0–7.0)
Hematocrit: 48.4 % (ref 38.0–52.0)
Hemoglobin: 16.9 g/dL (ref 13.0–17.3)
Immature Grans (Abs): 0.02 10*3/uL (ref 0.00–0.06)
Immature Granulocytes: 0.3 % (ref 0.0–0.6)
Lymphocytes: 25.4 % (ref 15.0–45.0)
MCH: 30.9 pg (ref 27.0–34.5)
MCHC: 34.9 g/dL (ref 32.0–36.0)
MCV: 88.5 fL (ref 84.0–100.0)
MPV: 10.2 fL (ref 7.2–13.2)
Monocytes: 7.2 % (ref 4.0–12.0)
NRBC Absolute: 0 10*3/uL (ref 0.000–0.012)
NRBC Automated: 0 % (ref 0.0–0.2)
Neutrophils %: 65.4 % (ref 42.0–74.0)
Neutrophils Absolute: 4.7 10*3/uL (ref 1.6–7.3)
Platelets: 231 10*3/uL (ref 140–440)
RBC: 5.47 x10e6/mcL (ref 4.00–5.60)
RDW: 13 % (ref 11.0–16.0)
WBC: 7.1 10*3/uL (ref 3.8–10.6)

## 2021-06-16 LAB — HEMOGLOBIN A1C
Est. Avg. Glucose, WB: 117
Est. Avg. Glucose-calculated: 126
Hemoglobin A1C: 5.7 % (ref 4.0–6.0)

## 2021-06-21 ENCOUNTER — Ambulatory Visit: Admit: 2021-06-21 | Discharge: 2021-06-21 | Payer: MEDICARE | Attending: Family Medicine | Primary: Family Medicine

## 2021-06-21 DIAGNOSIS — C61 Malignant neoplasm of prostate: Secondary | ICD-10-CM

## 2021-06-21 NOTE — Progress Notes (Signed)
Medicare Annual Wellness Visit      Assessment & Plan     Prostate cancer [facility])  Hypogonadism in male    Recommendations for Preventive Services Due: see orders and patient instructions/AVS.  Recommended screening schedule for the next 5-10 years is provided to the patient in written form: see Patient Instructions/AVS.     Return for Medicare Annual Wellness Visit in 1 year.     Subjective       Patient's complete Health Risk Assessment and screening values have been reviewed and are found in Flowsheets. The following problems were reviewed today and where indicated follow up appointments were made and/or referrals ordered. Followed by Dr. Candelaria Stagers for prostate cancer.  Has been on testosterone replacement long-term.  PSA remains undetectable.  Had mild ovation of hemoglobin 6 months ago but this has improved.  No donating blood regularly.  Up-to-date on health screenings.  As his urologist will be taking a leave of absence I will assume prescribing his testosterone so I have printed an order for labs for 6 months.  Recent labs are otherwise normal.    Positive Risk Factor Screenings with Interventions:             General Health and ACP:       Advance Directives       Power of Attorney Living Will ACP-Advance Directive ACP-Power of Attorney    Not on File Not on File Not on File Not on File          General Health Risk Interventions:                Objective   Vitals:    06/21/21 1101   BP: 120/76   Site: Left Upper Arm   Position: Sitting   Cuff Size: Small Adult   Pulse: 50   Resp: 18   SpO2: 96%   Weight: 168 lb 1.6 oz (76.2 kg)      There is no height or weight on file to calculate BMI.           GENERAL APPEARANCE: pleasant, well nourished/hydrated, well developed, NAD on RA.   HEAD:  normocephalic, atraumatic.   EYES:  pupils equal, round, reactive to light, sclera non-icteric, extraocular movement intact (EOMI).   EARS: hearing grossly intact, pinna symmetric and nontender.   ORAL CAVITY:  mucosa moist,  tongue in midline.   THROAT:  no erythema, no exudate.   NECK:  neck supple, no thyromegaly, no lymphadenopathy.   CARDIOVASCULAR:  RRR, No murmurs, rubs or gallops.   RESPIRATORY: unlabored, clear to auscultation bilaterally, no wheezes, rales, or rhonchi.   ABDOMEN:  soft, NTND, +BS, no guarding or rigidity.   SKIN:  warm and dry, no rashes or abnormal lesions visible.   MUSCULOSKELETAL: normal strength throughout, able to ambulate independently with normal gait/station and no assistive devices.   EXTREMITIES:  no edema, cyanosis, clubbing, or deformity noted.   NEUROLOGIC: alert and oriented, nonfocal, cerebellar function normal, cognitive exam grossly normal, cooperative with exam, cranial nerves 2-12 grossly intact, deep tendon reflexes 2+ symmetrical.   PSYCH:  cooperative with exam, good eye contact, judgement, and insight good, mood/affect appropriate, speech clear.    No Known Allergies  Prior to Visit Medications    Medication Sig Taking? Authorizing Provider   ibuprofen (ADVIL;MOTRIN) 800 MG tablet 1 tablet with food or milk Orally BID x 2 weeks, then BID prn Yes Rsfh Rsfh Automatic Reconciliation, MD   zolpidem (AMBIEN) 10 MG tablet  Take 1 tablet by mouth nightly as needed. Yes Historical Provider, MD   testosterone cypionate (DEPOTESTOTERONE CYPIONATE) 200 MG/ML injection Inject 1 mL into the muscle every 14 days. Yes Historical Provider, MD   rosuvastatin (CRESTOR) 20 MG tablet Take 1 tablet by mouth daily Yes Historical Provider, MD   Multiple Vitamins-Minerals (MULTI FOR HIM) TABS Take by mouth Yes Historical Provider, MD   Garlic [id number] MG CAPS Take by mouth Yes Historical Provider, MD   Omega-3 Fatty Acids (FISH OIL) 500 MG CAPS Take 1 capsule by mouth 2 times daily Yes Historical Provider, MD   turmeric 500 MG CAPS Take by mouth As directed Yes Historical Provider, MD       CareTeam (Including outside providers/suppliers regularly involved in providing care):   Patient Care Team:  Lowell Guitar, MD as  PCP - General     Reviewed and updated this visit:  Tobacco   Allergies   Meds   Problems   Med Hx   Surg Hx   Soc Hx   Fam Hx

## 2021-06-21 NOTE — Patient Instructions (Signed)
Personalized Preventive Plan for Oscar Turner - 06/21/2021  Medicare offers a range of preventive health benefits. Some of the tests and screenings are paid in full while other may be subject to a deductible, co-insurance, and/or copay.    Some of these benefits include a comprehensive review of your medical history including lifestyle, illnesses that may run in your family, and various assessments and screenings as appropriate.    After reviewing your medical record and screening and assessments performed today your provider may have ordered immunizations, labs, imaging, and/or referrals for you.  A list of these orders (if applicable) as well as your Preventive Care list are included within your After Visit Summary for your review.    Other Preventive Recommendations:    A preventive eye exam performed by an eye specialist is recommended every 1-2 years to screen for glaucoma; cataracts, macular degeneration, and other eye disorders.  A preventive dental visit is recommended every 6 months.  Try to get at least 150 minutes of exercise per week or 10,000 steps per day on a pedometer .  Order or download the FREE "Exercise & Physical Activity: Your Everyday Guide" from [employer]. Call 316-417-4857 or search [employer] online.  You need 1200-1500 mg of calcium and 1000-2000 IU of vitamin D per day. It is possible to meet your calcium requirement with diet alone, but a vitamin D supplement is usually necessary to meet this goal.  When exposed to the sun, use a sunscreen that protects against both UVA and UVB radiation with an SPF of 30 or greater. Reapply every 2 to 3 hours or after sweating, drying off with a towel, or swimming.  Always wear a seat belt when traveling in a car. Always wear a helmet when riding a bicycle or motorcycle.

## 2021-06-24 NOTE — Addendum Note (Signed)
Addended by: Idolina Primer, Mintie Witherington A on: 06/24/2021 09:54 AM     Modules accepted: Level of Service

## 2021-06-30 ENCOUNTER — Encounter: Admit: 2021-06-30 | Discharge: 2021-06-30 | Payer: MEDICARE | Primary: Family Medicine

## 2021-06-30 DIAGNOSIS — Z23 Encounter for immunization: Secondary | ICD-10-CM

## 2021-06-30 NOTE — Progress Notes (Signed)
[place] [address]  Phone:  [phone]  Fax:  [phone]    CHIEF COMPLAINT  Injection/immunization      HISTORY OF PRESENT ILLNESS:  Oscar Turner is a 75 y.o. male  who presents today for Hepatitis B injection.      HISTORY:  No Known Allergies            IMPRESSION/PLAN:  1. Need for hepatitis B vaccination    - Hep B, ENGERIX-B, (age 40 yrs+), IM, 23m, 3-dose       Patient tolerated injection well reporting no adverse side effects at this time.  Patient provided education and VIS sheet.      Follow up and Dispositions:  1 month            DLonna Cobb RN

## 2021-06-30 NOTE — Progress Notes (Deleted)
[place] [address]  Phone:  [phone]  Fax:  [phone]    CHIEF COMPLAINT  Injection/immunization      HISTORY OF PRESENT ILLNESS:  Oscar Turner is a 75 y.o. male  who presents today for Hepatitis B injection      HISTORY:  No Known Allergies     Current Outpatient Medications   Medication Sig Dispense Refill    ibuprofen (ADVIL;MOTRIN) 800 MG tablet 1 tablet with food or milk Orally BID x 2 weeks, then BID prn      zolpidem (AMBIEN) 10 MG tablet Take 1 tablet by mouth nightly as needed.      testosterone cypionate (DEPOTESTOTERONE CYPIONATE) 200 MG/ML injection Inject 1 mL into the muscle every 14 days.      rosuvastatin (CRESTOR) 20 MG tablet Take 1 tablet by mouth daily      Multiple Vitamins-Minerals (MULTI FOR HIM) TABS Take by mouth      Garlic [id number] MG CAPS Take by mouth      Omega-3 Fatty Acids (FISH OIL) 500 MG CAPS Take 1 capsule by mouth 2 times daily      turmeric 500 MG CAPS Take by mouth As directed       No current facility-administered medications for this visit.                 IMPRESSION/PLAN:      Patient tolerated injection well reporting no adverse side effects at this time.  Patient provided education and VIS sheet.      Follow up and Dispositions:  1 month            Lonna Cobb, RN

## 2021-08-02 ENCOUNTER — Encounter: Payer: MEDICARE | Attending: Urology | Primary: Family Medicine

## 2021-08-03 ENCOUNTER — Encounter

## 2021-08-03 NOTE — Telephone Encounter (Signed)
Former Dr Candelaria Stagers patient scheduled to see Dr Cindi Carbon on 9/23. Needs the following labs:  Psa for prostate cancer     CBC/testosterone for hypogonadism.     Thank you.

## 2021-08-04 ENCOUNTER — Encounter: Primary: Family Medicine

## 2021-08-09 ENCOUNTER — Encounter

## 2021-08-09 LAB — PROSTATE SPECIFIC ANTIGEN, TOTAL: PSA: 0.033 ng/mL (ref 0.000–4.000)

## 2021-08-09 LAB — CBC
Hematocrit: 50.6 % (ref 38.0–52.0)
Hemoglobin: 17.7 g/dL — ABNORMAL HIGH (ref 13.0–17.3)
MCH: 31.3 pg (ref 27.0–34.5)
MCHC: 35 g/dL (ref 32.0–36.0)
MCV: 89.6 fL (ref 84.0–100.0)
MPV: 10.4 fL (ref 7.2–13.2)
NRBC Absolute: 0 10*3/uL (ref 0.000–0.012)
NRBC Automated: 0 % (ref 0.0–0.2)
Platelets: 212 10*3/uL (ref 140–440)
RBC: 5.65 x10e6/mcL — ABNORMAL HIGH (ref 4.00–5.60)
RDW: 13.1 % (ref 11.0–16.0)
WBC: 7.6 10*3/uL (ref 3.8–10.6)

## 2021-08-09 LAB — TESTOSTERONE: Testosterone: 1199 ng/dL — ABNORMAL HIGH (ref 193.0–740.0)

## 2021-08-13 ENCOUNTER — Ambulatory Visit: Admit: 2021-08-13 | Discharge: 2021-08-13 | Payer: MEDICARE | Attending: Urology | Primary: Family Medicine

## 2021-08-13 DIAGNOSIS — E291 Testicular hypofunction: Secondary | ICD-10-CM

## 2021-08-13 MED ORDER — SILDENAFIL CITRATE 100 MG PO TABS
100 | ORAL_TABLET | ORAL | 11 refills | Status: DC | PRN
Start: 2021-08-13 — End: 2022-04-29

## 2021-08-13 MED ORDER — TESTOSTERONE CYPIONATE 200 MG/ML IM KIT
200 MG/ML | PACK | INTRAMUSCULAR | 1 refills | Status: DC
Start: 2021-08-13 — End: 2021-09-12

## 2021-08-13 NOTE — Progress Notes (Signed)
Oscar Turner (DOB:  1946-07-11) is a 75 y.o. male    Chief complaint(s):  No chief complaint on file.      History of Present Illness:   July 15, 2019:  History of prostate cancer as well as hypogonadism as below. Returns today  with a PSA of 0.02 which is quite stable. Hemoglobin 16.9. Testosterone is not  back yet. He is using 150 mg of Depo testosterone every 2 weeks. Getting  excellent clinical response. He is using sildenafil for ED when needed is  working very well for him.  December 28, 2018:  Oscar Turner returns today for follow-up of hypogonadism, erectile dysfunction,  Peyronie's disease, history of prostate cancer status post robotic prostatectomy in  2012.  He is still using 150 mg of Depo testosterone every 2 weeks and his most recent  testosterone level was 526 with a free testosterone level 13.68 well within the  middle of normal limits. He feels that he's had a good clinical response.  He is using sildenafil for ED which is working well and occasionally he does not  need it.  September 03, 2018:  Oscar Turner returns today for follow-up. Recall he has a history of prostate cancer,  erectile dysfunction, Peyronie's disease and hypogonadism. He is now using 150  mg of intramuscular Depo testosterone every 2 weeks. He returns with a free  testosterone that is 33.18 mg/dL, significantly above the reference range. He  does feel that he's had very good improvement in his libido and energy since he  started androgen replacement therapy. His hemoglobin was 16.3 and PSA 0.02.  February 16, 2018  Oscar Turner returns today after we reduced his testosterone dose to 150 mg of  Depo testosterone every 2 weeks. His testosterone remains just slightly elevated  with a free testosterone of around 22. His CBC demonstrates that his  hemoglobin has come down slightly to 17.3.  December 22, 2017:  Oscar Turner very nice gentleman returns for follow-up of prostate cancer,  hypogonadism, erectile dysfunction. He also has  Peyronie's disease. As far as his  hypogonadism goes he is using 200 mg of Depo testosterone every 2 weeks. He  has had great clinical improvement in his symptoms including his libido. He  returns with a testosterone level it is above the normal range 1033 as well as a  slightly elevated hemoglobin of 17.7. His PSA is also detectable but only in the  ultrasensitive range at 0.016.  He is using generic sildenafil for his ED which is working sufficiently. He does  have Peyronie's disease but feels that it is getting better and it does not impair  intercourse.  June 19, 2017:  This is a 75 year old gentleman who I have been following for hypogonadism as  well as prostate cancer and he is now over 6 years following robotic  prostatectomy. PSA has been undetectable on ultrasensitive assay most recently  in September 2017. He is using 200 mg of Depo testosterone every 2 weeks.  Recent testosterone level was 555 checked about midcycle. He feels that he has  had an excellent clinical response with better energy and libido. He continues to  play golf although this has been put on hiatus due to recent left rotator cuff  surgery. Hemoglobin recently checked was 16.6.  October 69, 2893:  75 year old white male with a history of robotic prostatectomy in 2012 an  undetectable PSA. As a long history of hypogonadism. At one time was on  AndroGel prior to  his diagnosis. At his last visit with me earlier this year he was  complaining of symptoms of hypogonadism. Testosterone was found to be the  low end of normal range. We agreed to go ahead and start him on Depo  testosterone and he is doing 200 mg every 2 weeks. He states that this has made  significant improvements in his lifestyle most notably his libido and energy.  Most recent labs demonstrated a testosterone in the low end of the normal range  but this was drawn at the end of his cycle as well as an undetectable PSA and a  hemoglobin that was just slightly out of the normal  range at 17.5.  He is doing very well from a urinary standpoint.    July 20, 2020:  Mr. Oscar Turner returns today for follow-up of hypogonadism, history of prostate  cancer, erectile dysfunction. Most recent PSA 0.038 in comparison to 0.02 and  2020. He status post robotic prostatectomy in 2012. PSA has been rising slowly.  He is taking supplemental testosterone and we did discuss the theoretical risks.  Most recent testosterone level was 610 ng/dL on 200 mg of Depo testosterone  every 2 weeks. He has very rare stress incontinence. He gets good erections with  sildenafil.    08/13/2021  The patient is now 75 y.o. He has a h/o low PSA s/p RALP 2012. PSA 0.033. He is doing well on the regimen of testosterone cypionate 234m/ml 1 ml IM q2weeks. Testosterone 1199. No voiding changes, hematuria, unintended weight loss or bone pain.     07/12/2019 PSA 0.021 Testosterone 250  07/13/2020 PSA 0.038 Testosterone 610  08/09/2021 PSA 0.033 Testosterone 1199 Hgb 17.7       Current Outpatient Medications:     sildenafil (VIAGRA) 100 MG tablet, Take 1 tablet by mouth as needed for Erectile Dysfunction, Disp: 8 tablet, Rfl: 11    Testosterone Cypionate 200 MG/ML KIT, Inject 0.8 mLs into the muscle every 14 days for 90 days., Disp: 1 kit, Rfl: 1    ibuprofen (ADVIL;MOTRIN) 800 MG tablet, 1 tablet with food or milk Orally BID x 2 weeks, then BID prn, Disp: , Rfl:     zolpidem (AMBIEN) 10 MG tablet, Take 1 tablet by mouth nightly as needed., Disp: , Rfl:     testosterone cypionate (DEPOTESTOTERONE CYPIONATE) 200 MG/ML injection, Inject 1 mL into the muscle every 14 days., Disp: , Rfl:     rosuvastatin (CRESTOR) 20 MG tablet, Take 1 tablet by mouth daily, Disp: , Rfl:     Multiple Vitamins-Minerals (MULTI FOR HIM) TABS, Take by mouth, Disp: , Rfl:     Garlic 3532MG CAPS, Take by mouth, Disp: , Rfl:     Omega-3 Fatty Acids (FISH OIL) 500 MG CAPS, Take 1 capsule by mouth 2 times daily, Disp: , Rfl:     turmeric 500 MG CAPS, Take by mouth As  directed, Disp: , Rfl:     Past Medical History:   Diagnosis Date    Prostate cancer ([place]     Skin cancer of face     s/p excision        Past Surgical History:   Procedure Laterality Date    [place]    OTHER SURGICAL HISTORY  08/2014    Compression fracture of L4    PROSTATECTOMY  02/2011    radical    ROTATOR CUFF REPAIR Right 10/11/2018    Dr. CKayleen Memos  SHOULDER ARTHROSCOPY W/ ROTATOR CUFF REPAIR Left 05/11/2017    Dr. Maryfrances Bunnell    TONSILLECTOMY         Family History   Problem Relation Age of Onset    Other Mother         Dialysis    Stroke Father     Coronary Art Dis Father     Lung Disease Father     Hypertension Father     Gout Father     Alcohol Abuse Brother     No Known Problems Brother     No Known Problems Daughter     No Known Problems Daughter     No Known Problems Other        Social History     Socioeconomic History    Marital status: Married     Spouse name: Not on file    Number of children: Not on file    Years of education: Not on file    Highest education level: Not on file   Occupational History    Not on file   Tobacco Use    Smoking status: Former     Packs/day: 1.00     Years: 30.00     Pack years: 30.00     Types: Cigarettes     Quit date: 11/22/2015     Years since quitting: 5.7    Smokeless tobacco: Never    Tobacco comments:     pack years 75; Patient counselled on the dangers of tobacco 12/18/2020   Substance and Sexual Activity    Alcohol use: Yes     Comment: 2-4 times a month; 1-2 drinks    Drug use: Never    Sexual activity: Not on file   Other Topics Concern    Not on file   Social History Narrative    Not on file     Social Determinants of Health     Financial Resource Strain: Not on file   Food Insecurity: Not on file   Transportation Needs: Not on file   Physical Activity: Not on file   Stress: Not on file   Social Connections: Not on file   Intimate Partner Violence: Not on file   Housing Stability: Not on file       Allergies:  No Known Allergies    There  were no vitals filed for this visit.    ROS:    General/Constitutional:           Fever denies.  Weight loss denies.          Endocrine:           Diabetes denies.          Respiratory:           Shortness of breath denies.          Cardiovascular:           Chest pain denies.          Gastrointestinal:           Abdominal pain denies.  Nausea denies.          Musculoskeletal:           Weakness denies.          Hematology:           Prolonged bleeding denies.           Skin:           Rash denies.  Genitourinary:           Frequent urination denies.          Neurologic:           Stroke denies.          Psychiatric:           Depression denies.          Dermatology:           Skin rash denies.      Physical Exam:  General Examination:  GENERAL APPEARANCE: well developed, well nourished. No acute distress.  HEAD: normocephalic, atraumatic.  EYES: pupils equal, round, reactive to light.  HEART: normal.  LUNGS: normal, good air movement, not requiring secondary accessory muscles.  ABDOMEN: normal, no masses palpable, soft, nontender, nondistended.  SKIN: no suspicious lesions, warm and dry.  MUSCULOSKELETAL: full range of motion, normal gait.  EXTREMITIES: no edema.  PSYCH: alert, oriented, cognitive function intact    Lab Results   Component Value Date    PSA 0.033 08/09/2021    PSA 0.038(i) 07/13/2020    PSA 0.021(i) 07/12/2019           Lab Results   Component Value Date    WBC 7.6 08/09/2021    HGB 17.7 (H) 08/09/2021    HCT 50.6 08/09/2021    MCV 89.6 08/09/2021    PLT 212 08/09/2021        Lab Results   Component Value Date/Time    ALKPHOS 67 06/16/2021 12:47 PM    ALT 31 06/16/2021 12:47 PM    AST 23 06/16/2021 12:47 PM    PROT 6.4 06/16/2021 12:47 PM    BILITOT 0.41 06/16/2021 12:47 PM    LABALBU 4.3 06/16/2021 12:47 PM        Lab Results   Component Value Date    TESTOSTERONE 610 07/13/2020          No results found for requested labs within last 30 days.       No results found for any visits on  08/13/21.    ASSESSMENT/PLAN:  1. Hypogonadism in male  -     Hepatic Function Panel; Future  -     Testosterone; Future  -     Testosterone Cypionate 200 MG/ML KIT; Inject 0.8 mLs into the muscle every 14 days for 90 days., Disp-1 kit, R-1Normal  2. Prostate cancer ([place])  -     Prostate Specific Antigen, Total; Future  3. Polycythemia  -     CBC; Future  4. Erectile dysfunction after radical prostatectomy  -     sildenafil (VIAGRA) 100 MG tablet; Take 1 tablet by mouth as needed for Erectile Dysfunction, Disp-8 tablet, R-11Normal    He feels well. Hgb increased. He was directed to give blood intermittently. May benefit from lowering testosterone to 0.8 ml IM q2weeks more than giving blood. Recheck labs and mid cycle draw in 3 months.  He also need refill of sildenafil to manage ED.    30 minutes were spent in patient examination, reviewing results, discussing treatment options, documenting and counseling.     Electronically signed by:  --Elwin Sleight, MD

## 2021-08-20 ENCOUNTER — Other Ambulatory Visit: Payer: Self-pay | Admitting: Internal Medicine

## 2021-08-20 DIAGNOSIS — E039 Hypothyroidism, unspecified: Secondary | ICD-10-CM

## 2021-09-12 ENCOUNTER — Encounter

## 2021-09-13 MED ORDER — ROSUVASTATIN CALCIUM 20 MG PO TABS
20 MG | ORAL_TABLET | Freq: Every day | ORAL | 1 refills | Status: AC
Start: 2021-09-13 — End: 2021-12-12

## 2021-09-13 MED ORDER — TESTOSTERONE CYPIONATE 200 MG/ML IM SOLN
200 MG/ML | INTRAMUSCULAR | 5 refills | Status: AC
Start: 2021-09-13 — End: 2021-12-12

## 2021-09-13 MED ORDER — TESTOSTERONE CYPIONATE 200 MG/ML IM KIT
200 MG/ML | PACK | INTRAMUSCULAR | 1 refills | Status: DC
Start: 2021-09-13 — End: 2021-09-13

## 2021-09-13 NOTE — Telephone Encounter (Signed)
From: Trellis Moment  To: Dr. Lowell Guitar  Sent: 09/12/2021 4:26 PM EDT  Subject: Crestor    Dr. Idolina Primer.   My Crestor prescription doesn't have any more refills. I have three tablets left. My blood work was fine when I saw you in September. Can you please call in the refill?  Thanks  Oscar Turner

## 2021-09-13 NOTE — Telephone Encounter (Signed)
Rx pend NOV 12/22/21

## 2021-09-13 NOTE — Telephone Encounter (Signed)
From: Trellis Moment  To: Office of Dr. Lowell Guitar  Sent: 09/12/2021 4:24 PM EDT  Subject: Medication Renewal Request    Refills have been requested for the following medications:   Other - Crestor    Preferred pharmacy: [id number] - [place], [place] - [address] - [address] [phone] Wanda Plump [phone]      Medication renewals requested in this message routed separately:     Testosterone Cypionate 200 MG/ML KIT [Dr. Elwin Sleight, MD]   Patient Comment: Testosterone

## 2021-09-21 ENCOUNTER — Encounter: Payer: Self-pay | Admitting: Internal Medicine

## 2021-09-21 NOTE — Telephone Encounter (Signed)
Called in testoterone cypionate 200mg /ml IM 0.62ml IM q2wks qty 43ml w 2 rf to HT

## 2021-09-22 ENCOUNTER — Other Ambulatory Visit: Payer: Self-pay | Admitting: Internal Medicine

## 2021-09-22 DIAGNOSIS — E039 Hypothyroidism, unspecified: Secondary | ICD-10-CM

## 2021-09-22 DIAGNOSIS — R739 Hyperglycemia, unspecified: Secondary | ICD-10-CM

## 2021-09-22 DIAGNOSIS — Z21 Asymptomatic human immunodeficiency virus [HIV] infection status: Secondary | ICD-10-CM

## 2021-10-05 DIAGNOSIS — L570 Actinic keratosis: Secondary | ICD-10-CM | POA: Diagnosis not present

## 2021-10-05 DIAGNOSIS — D2272 Melanocytic nevi of left lower limb, including hip: Secondary | ICD-10-CM | POA: Diagnosis not present

## 2021-10-05 DIAGNOSIS — Z85828 Personal history of other malignant neoplasm of skin: Secondary | ICD-10-CM | POA: Diagnosis not present

## 2021-10-05 DIAGNOSIS — L821 Other seborrheic keratosis: Secondary | ICD-10-CM | POA: Diagnosis not present

## 2021-10-05 DIAGNOSIS — D225 Melanocytic nevi of trunk: Secondary | ICD-10-CM | POA: Diagnosis not present

## 2021-10-05 DIAGNOSIS — I788 Other diseases of capillaries: Secondary | ICD-10-CM | POA: Diagnosis not present

## 2021-10-05 DIAGNOSIS — B353 Tinea pedis: Secondary | ICD-10-CM | POA: Diagnosis not present

## 2021-10-05 NOTE — Telephone Encounter (Signed)
Patient states he called [facility] and was told there was not an order for labs for him  Informed patient Noralee Space was a walk in and labs were ordered on 11-2  Patient is requesting a call back to discuss lab orders

## 2021-10-07 ENCOUNTER — Other Ambulatory Visit (INDEPENDENT_AMBULATORY_CARE_PROVIDER_SITE_OTHER): Payer: Medicare Other

## 2021-10-07 ENCOUNTER — Other Ambulatory Visit: Payer: Self-pay | Admitting: Internal Medicine

## 2021-10-07 DIAGNOSIS — Z21 Asymptomatic human immunodeficiency virus [HIV] infection status: Secondary | ICD-10-CM

## 2021-10-07 DIAGNOSIS — R739 Hyperglycemia, unspecified: Secondary | ICD-10-CM

## 2021-10-07 DIAGNOSIS — E039 Hypothyroidism, unspecified: Secondary | ICD-10-CM | POA: Diagnosis not present

## 2021-10-07 LAB — BASIC METABOLIC PANEL
BUN: 21 mg/dL (ref 6–23)
CO2: 23 mEq/L (ref 19–32)
Calcium: 8.9 mg/dL (ref 8.4–10.5)
Chloride: 104 mEq/L (ref 96–112)
Creatinine, Ser: 1.11 mg/dL (ref 0.40–1.50)
GFR: 65.89 mL/min (ref >60.00)
Glucose, Bld: 87 mg/dL (ref 70–99)
Potassium: 3.8 mEq/L (ref 3.5–5.1)
Sodium: 135 mEq/L (ref 135–145)

## 2021-10-07 LAB — HEMOGLOBIN A1C: Hgb A1c MFr Bld: 5.7 % (ref 4.6–6.5)

## 2021-10-07 LAB — TSH: TSH: 1.17 u[IU]/mL (ref 0.35–5.50)

## 2021-10-08 NOTE — Telephone Encounter (Signed)
Per chart pt had labs drawn this am.. closing encounter.Marland KitchenJohny Chess

## 2021-10-09 LAB — HIV-1 RNA QUANT-NO REFLEX-BLD
HIV 1 RNA Quant: NOT DETECTED Copies/mL
HIV-1 RNA Quant, Log: NOT DETECTED Log cps/mL

## 2021-10-11 ENCOUNTER — Encounter: Payer: Self-pay | Admitting: Internal Medicine

## 2021-10-19 DIAGNOSIS — H2513 Age-related nuclear cataract, bilateral: Secondary | ICD-10-CM | POA: Diagnosis not present

## 2021-10-19 DIAGNOSIS — H5203 Hypermetropia, bilateral: Secondary | ICD-10-CM | POA: Diagnosis not present

## 2021-10-19 DIAGNOSIS — H43812 Vitreous degeneration, left eye: Secondary | ICD-10-CM | POA: Diagnosis not present

## 2021-11-10 ENCOUNTER — Encounter

## 2021-11-10 LAB — CBC
Hematocrit: 46.8 % (ref 38.0–52.0)
Hemoglobin: 16.8 g/dL (ref 13.0–17.3)
MCH: 31.6 pg (ref 27.0–34.5)
MCHC: 35.9 g/dL (ref 32.0–36.0)
MCV: 88.1 fL (ref 84.0–100.0)
MPV: 10.3 fL (ref 7.2–13.2)
NRBC Absolute: 0 10*3/uL (ref 0.000–0.012)
NRBC Automated: 0 % (ref 0.0–0.2)
Platelets: 216 10*3/uL (ref 140–440)
RBC: 5.31 x10e6/mcL (ref 4.00–5.60)
RDW: 13 % (ref 11.0–16.0)
WBC: 6.2 10*3/uL (ref 3.8–10.6)

## 2021-11-10 LAB — HEPATIC FUNCTION PANEL
ALT: 34 U/L (ref 0–50)
AST: 23 U/L (ref 0–50)
Albumin: 4.4 g/dL (ref 3.5–5.2)
Alk Phosphatase: 55 U/L (ref 40–130)
Bilirubin, Direct: <0.2 mg/dL (ref 0.00–0.30)
Total Bilirubin: 0.52 mg/dL (ref 0.00–1.20)
Total Protein: 6.4 g/dL (ref 6.4–8.3)

## 2021-11-10 LAB — TESTOSTERONE: Testosterone: 416 ng/dL (ref 193.0–740.0)

## 2021-11-10 LAB — PSA, DIAGNOSTIC: PSA: 0.034 ng/mL (ref 0.000–4.000)

## 2021-11-12 ENCOUNTER — Other Ambulatory Visit: Payer: Self-pay | Admitting: Internal Medicine

## 2021-11-12 DIAGNOSIS — E039 Hypothyroidism, unspecified: Secondary | ICD-10-CM

## 2021-11-19 ENCOUNTER — Encounter: Payer: MEDICARE | Attending: Urology | Primary: Family Medicine

## 2021-12-13 ENCOUNTER — Encounter: Payer: Self-pay | Admitting: Internal Medicine

## 2021-12-13 ENCOUNTER — Ambulatory Visit (INDEPENDENT_AMBULATORY_CARE_PROVIDER_SITE_OTHER): Payer: Medicare Other | Admitting: Internal Medicine

## 2021-12-13 ENCOUNTER — Other Ambulatory Visit: Payer: Self-pay

## 2021-12-13 VITALS — BP 128/84 | HR 71 | Temp 97.9°F | Resp 16 | Ht 74.0 in | Wt 208.0 lb

## 2021-12-13 DIAGNOSIS — H61813 Exostosis of external canal, bilateral: Secondary | ICD-10-CM | POA: Insufficient documentation

## 2021-12-13 DIAGNOSIS — Z23 Encounter for immunization: Secondary | ICD-10-CM

## 2021-12-13 DIAGNOSIS — E039 Hypothyroidism, unspecified: Secondary | ICD-10-CM

## 2021-12-13 DIAGNOSIS — K645 Perianal venous thrombosis: Secondary | ICD-10-CM | POA: Diagnosis not present

## 2021-12-13 MED ORDER — LEVOTHYROXINE SODIUM 137 MCG PO TABS: 137.0000 ug | ORAL_TABLET | Freq: Every day | ORAL | 1 refills | Status: DC

## 2021-12-13 NOTE — Patient Instructions (Signed)
How to Take a [employer] A sitz bath is a warm water bath that may be used to care for your rectum, genital area, or the area between your rectum and genitals (perineum). In a sitz bath, the water only comes up to your hips and covers your buttocks. A sitz bath may be done in a bathtub or with a portable sitz bath that fits over the toilet. Your health care provider may recommend a sitz bath to help: Relieve pain and discomfort after delivering a baby. Relieve pain and itching from hemorrhoids or anal fissures. Relieve pain after certain surgeries. Relax muscles that are sore or tight. How to take a sitz bath Take 3-4 sitz baths a day, or as many as told by your health care provider. Bathtub sitz bath To take a sitz bath in a bathtub: Partially fill a bathtub with warm water. The water should be deep enough to cover your hips and buttocks when you are sitting in the tub. Follow your health care provider's instructions if you are told to put medicine in the water. Sit in the water. Open the tub drain a little, and leave it open during your bath. Turn on the warm water again, enough to replace the water that is draining out. Keep the water running throughout your bath. This helps keep the water at the right level and temperature. Soak in the water for 15-20 minutes, or as long as told by your health care provider. When you are done, be careful when you stand up. You may feel dizzy. After the sitz bath, pat yourself dry. Do not rub your skin to dry it.  Over-the-toilet sitz bath To take a sitz bath with an over-the-toilet basin: Follow the manufacturer's instructions. Fill the basin with warm water. Follow your health care provider's instructions if you were told to put medicine in the water. Sit on the seat. Make sure the water covers your buttocks and perineum. Soak in the water for 15-20 minutes, or as long as told by your health care provider. After the sitz bath, pat yourself dry. Do not  rub your skin to dry it. Clean and dry the basin between uses. Discard the basin if it cracks, or according to the manufacturer's instructions.  Contact a health care provider if: Your pain or itching gets worse. Do not continue with sitz baths if your symptoms get worse. You have new symptoms. Do not continue with sitz baths until you talk with your health care provider. Summary A sitz bath is a warm water bath in which the water only comes up to your hips and covers your buttocks. A sitz bath may help relieve pain and discomfort after delivering a baby. It also may help with pain and itching from hemorrhoids or anal fissures, or pain after certain surgeries. It can also help to relax muscles that are sore or tight. Take 3-4 sitz baths a day, or as many as told by your health care provider. Soak in the water for 15-20 minutes. Do not continue with sitz baths if your symptoms get worse. This information is not intended to replace advice given to you by your health care provider. Make sure you discuss any questions you have with your health care provider. Document Revised: 07/23/2020 Document Reviewed: 07/23/2020 Elsevier Patient Education  2022 [employer].

## 2021-12-13 NOTE — Progress Notes (Signed)
Subjective:  Patient ID: Zachary Levy, male    DOB: [birth year]  Age: 76 y.o. MRN: [id number]  CC: Follow-up  This visit occurred during the SARS-CoV-2 public health emergency.  Safety protocols were in place, including screening questions prior to the visit, additional usage of staff PPE, and extensive cleaning of exam room while observing appropriate contact time as indicated for disinfecting solutions.    HPI Zachary Levy presents for f/up -  He has noticed for the last month that he has had a bump on his perianal region.  It does not bother him much.  He has also had a gradual decrease in his level of hearing and feels like the left ear is closing up on him.  Outpatient Medications Prior to Visit  Medication Sig Dispense Refill   Emtricitab-Rilpivir-Tenofov AF (ODEFSEY PO) Take by mouth.     mirabegron ER (MYRBETRIQ) 50 MG TB24 tablet Take 1 tablet (50 mg total) by mouth daily. 90 tablet 0   raltegravir (ISENTRESS) 400 MG tablet Take 400 mg by mouth 2 (two) times daily.     levothyroxine (SYNTHROID) 137 MCG tablet TAKE 1 TABLET BY MOUTH ONCE DAILY BEFORE BREAKFAST 90 tablet 0   No facility-administered medications prior to visit.    ROS Review of Systems  Constitutional: Negative.  Negative for fatigue.  HENT:  Positive for hearing loss. Negative for ear pain, nosebleeds and sinus pressure.   Eyes: Negative.   Respiratory:  Negative for cough and shortness of breath.   Cardiovascular:  Negative for chest pain, palpitations and leg swelling.  Gastrointestinal:  Negative for abdominal pain, anal bleeding, blood in stool, constipation, diarrhea, nausea and rectal pain.  Endocrine: Negative.   Genitourinary: Negative.   Musculoskeletal: Negative.   Skin: Negative.   Neurological:  Negative for dizziness and weakness.  Hematological:  Negative for adenopathy. Does not bruise/bleed easily.  Psychiatric/Behavioral: Negative.     Objective:  BP 128/84 (BP Location: Right Arm,  Patient Position: Sitting, Cuff Size: Large)    Pulse 71    Temp 97.9 F (36.6 C) (Oral)    Resp 16    Ht 6\' 2"  (1.88 m)    Wt 208 lb (94.3 kg)    BMI 26.71 kg/m   BP Readings from Last 3 Encounters:  12/13/21 128/84  06/08/21 112/74  02/08/21 118/70    Wt Readings from Last 3 Encounters:  12/13/21 208 lb (94.3 kg)  06/08/21 201 lb (91.2 kg)  02/08/21 200 lb 3.2 oz (90.8 kg)    Physical Exam Vitals reviewed. Exam conducted with a chaperone present Catering manager and [place]).  HENT:     Right Ear: Decreased hearing noted. No drainage or swelling. No middle ear effusion. There is no impacted cerumen. No foreign body. Tympanic membrane is not injected.     Left Ear: Decreased hearing noted. No drainage or swelling.  No middle ear effusion. There is no impacted cerumen. No foreign body. Tympanic membrane is not injected.     Ears:     Comments: There are boney growths over the medial EACs, larger on the left than the right.    Nose: Nose normal.     Mouth/Throat:     Mouth: Mucous membranes are moist.  Eyes:     General: No scleral icterus.    Conjunctiva/sclera: Conjunctivae normal.  Cardiovascular:     Rate and Rhythm: Normal rate and regular rhythm.     Heart sounds: No murmur heard. Pulmonary:     Effort: Pulmonary  effort is normal.     Breath sounds: No stridor. No wheezing, rhonchi or rales.  Abdominal:     General: Abdomen is flat.     Palpations: There is no mass.     Tenderness: There is no abdominal tenderness. There is no guarding.     Hernia: No hernia is present.  Genitourinary:    Prostate: Not enlarged, not tender and no nodules present.     Rectum: Guaiac result negative. External hemorrhoid and internal hemorrhoid present. No mass, tenderness or anal fissure. Normal anal tone.       Comments: Small right lateral thrombosed ext hemorrhoid. Uncomplicated internal hemorrhoids. Musculoskeletal:     Cervical back: Neck supple.  Lymphadenopathy:     Cervical: No  cervical adenopathy.  Skin:    General: Skin is warm and dry.  Neurological:     General: No focal deficit present.     Mental Status: He is alert.  Psychiatric:        Mood and Affect: Mood normal.    Lab Results  Component Value Date   WBC 4.6 03/12/2021   WBC 4.3 03/12/2021   HGB 15.4 03/12/2021   HGB 15.1 03/12/2021   HCT 45.3 03/12/2021   HCT 44.7 03/12/2021   PLT 177 03/12/2021   PLT 168.0 03/12/2021   GLUCOSE 87 10/07/2021   CHOL 231 (H) 05/11/2021   TRIG 55.0 05/11/2021   HDL 72.00 05/11/2021   LDLCALC 148 (H) 05/11/2021   ALT 19 03/12/2021   AST 18 03/12/2021   NA 135 10/07/2021   K 3.8 10/07/2021   CL 104 10/07/2021   CREATININE 1.11 10/07/2021   BUN 21 10/07/2021   CO2 23 10/07/2021   TSH 1.17 10/07/2021   PSA 3.09 05/11/2021   INR 1.0 02/12/2007   HGBA1C 5.7 10/07/2021    DG Cervical Spine Complete  Result Date: 04/09/2013 *RADIOLOGY REPORT* Clinical Data: Neck and arm pain, no trauma CERVICAL SPINE - COMPLETE 4+ VIEW Comparison: None. Findings: The cervical vertebrae are in normal alignment.  There is degenerative disc disease at C5-6 and C6-7 with some loss of disc space, sclerosis, and spurring.  No prevertebral soft tissue swelling is seen.  There is mild foraminal narrowing at C5-6 with no significant narrowing at C6-7.  The odontoid process is intact. The lung apices are clear. IMPRESSION: Degenerative disc disease at C5-6 and C6-7 with mild foraminal narrowing at C5-6. Original Report Authenticated By: Ivar Drape, M.D.   DG Knee 1-2 Views Left  Result Date: 04/09/2013 *RADIOLOGY REPORT* Clinical Data: Knee pain, no trauma LEFT KNEE - 1-2 VIEW Comparison: None. Findings: There may be very minimal decrease in medial joint space. No significant degenerative spurring is seen although there is a tiny spur emanating from the superior aspect of the patella.  No fracture is seen and no effusion is noted. IMPRESSION: Minimal degenerative change with some loss of  medial joint space and minimal patellar spurring. Original Report Authenticated By: Ivar Drape, M.D.   DG Knee 1-2 Views Right  Result Date: 04/09/2013 *RADIOLOGY REPORT* Clinical Data: Knee pain, no trauma RIGHT KNEE - 1-2 VIEW Comparison: None. Findings: Very minimal degenerative spurring is noted at the patellofemoral articulation.  No significant degenerative change is seen.  The joint spaces are relatively well preserved for age.  No effusion is noted. IMPRESSION: Minimal patellar spurring.  No significant degenerative change. Original Report Authenticated By: Ivar Drape, M.D.   After informed verbal consent was obtained. Using Betadine for cleansing and  1% plain Lidocaine for anesthetic (1 cc used), with sterile technique an 11 blade was used to make a small incision. A small clot was removed. Hemostasis was obtained by pressure. The procedure was well tolerated without complications.   Assessment & Plan:   Sofia was seen today for follow-up.  Diagnoses and all orders for this visit:  Exostosis of external canal, bilateral- This may be causing hearing loss. -     Ambulatory referral to ENT  Adult hypothyroidism- He will remain on the current T4 dosage. -     levothyroxine (SYNTHROID) 137 MCG tablet; Take 1 tablet (137 mcg total) by mouth daily before breakfast.  Thrombosed external hemorrhoid- Improvement noted after incision and drainage.  Other orders -     Pneumococcal polysaccharide vaccine 23-valent greater than or equal to 2yo subcutaneous/IM   I have changed Zachary Levy's levothyroxine. I am also having him maintain his raltegravir, Emtricitab-Rilpivir-Tenofov AF (ODEFSEY PO), and mirabegron ER.  Meds ordered this encounter  Medications   levothyroxine (SYNTHROID) 137 MCG tablet    Sig: Take 1 tablet (137 mcg total) by mouth daily before breakfast.    Dispense:  90 tablet    Refill:  1     Follow-up: Return in about 3 months (around 03/13/2022).  Scarlette Calico, MD

## 2021-12-20 ENCOUNTER — Encounter

## 2021-12-20 LAB — CBC WITH AUTO DIFFERENTIAL
Absolute Baso #: 0 10*3/uL (ref 0.0–0.2)
Absolute Eos #: 0.1 10*3/uL (ref 0.0–0.5)
Absolute Lymph #: 1.8 10*3/uL (ref 1.0–3.2)
Absolute Mono #: 0.6 10*3/uL (ref 0.3–1.0)
Basophils %: 0.6 % (ref 0.0–2.0)
Eosinophils %: 2 % (ref 0.0–7.0)
Hematocrit: 50.1 % (ref 38.0–52.0)
Hemoglobin: 17.6 g/dL — ABNORMAL HIGH (ref 13.0–17.3)
Immature Grans (Abs): 0.02 10*3/uL (ref 0.00–0.06)
Immature Granulocytes: 0.3 % (ref 0.0–0.6)
Lymphocytes: 27.7 % (ref 15.0–45.0)
MCH: 31.8 pg (ref 27.0–34.5)
MCHC: 35.1 g/dL (ref 32.0–36.0)
MCV: 90.6 fL (ref 84.0–100.0)
MPV: 10.3 fL (ref 7.2–13.2)
Monocytes: 9.3 % (ref 4.0–12.0)
NRBC Absolute: 0 10*3/uL (ref 0.000–0.012)
NRBC Automated: 0 % (ref 0.0–0.2)
Neutrophils %: 60.1 % (ref 42.0–74.0)
Neutrophils Absolute: 4 10*3/uL (ref 1.6–7.3)
Platelets: 235 10*3/uL (ref 140–440)
RBC: 5.53 x10e6/mcL (ref 4.00–5.60)
RDW: 13.2 % (ref 11.0–16.0)
WBC: 6.6 10*3/uL (ref 3.8–10.6)

## 2021-12-20 LAB — COMPREHENSIVE METABOLIC PANEL
ALT: 39 U/L (ref 0–50)
AST: 23 U/L (ref 0–50)
Albumin/Globulin Ratio: 2.2 (ref 1.00–2.70)
Albumin: 4.3 g/dL (ref 3.5–5.2)
Alk Phosphatase: 61 U/L (ref 40–130)
Anion Gap: 10 mmol/L (ref 2–17)
BUN: 16 mg/dL (ref 8–23)
CO2: 26 mmol/L (ref 22–29)
Calcium: 9.1 mg/dL (ref 8.8–10.2)
Chloride: 102 mmol/L (ref 98–107)
Creatinine: 1.1 mg/dL (ref 0.7–1.3)
Est, Glom Filt Rate: 71 mL/min/1.73m?? — ABNORMAL LOW (ref >=90)
Globulin: 2 g/dL (ref 1.9–4.4)
Glucose: 104 mg/dL — ABNORMAL HIGH (ref 70–99)
OSMOLALITY CALCULATED: 277 mOsm/kg (ref 270–287)
Potassium: 4.3 mmol/L (ref 3.5–5.3)
Sodium: 138 mmol/L (ref 135–145)
Total Bilirubin: 0.41 mg/dL (ref 0.00–1.20)
Total Protein: 6.3 g/dL — ABNORMAL LOW (ref 6.4–8.3)

## 2021-12-20 LAB — LIPID PANEL
Chol/HDL Ratio: 2.7 (ref 0.0–4.4)
Cholesterol: 133 mg/dL (ref 100–200)
HDL: 49 mg/dL (ref >=40)
LDL Cholesterol: 66.8 mg/dL (ref 0.0–100.0)
LDL/HDL Ratio: 1.4
Triglycerides: 86 mg/dL (ref 0–149)
VLDL: 17.2 mg/dL (ref 5.0–40.0)

## 2021-12-20 LAB — TESTOSTERONE: Testosterone: 432 ng/dL (ref 193.0–740.0)

## 2021-12-21 LAB — PSA, SERIAL: PSA (Serial Monitor): <0.1 ng/mL (ref 0.0–4.0)

## 2021-12-22 ENCOUNTER — Ambulatory Visit: Admit: 2021-12-22 | Discharge: 2021-12-22 | Payer: MEDICARE | Attending: Family Medicine | Primary: Family Medicine

## 2021-12-22 ENCOUNTER — Encounter: Payer: Self-pay | Admitting: Internal Medicine

## 2021-12-22 NOTE — Patient Instructions (Signed)
Learning About Lung Cancer Screening  What is screening for lung cancer?     Lung cancer screening is a way to find some lung cancers early, before a person has any symptoms of the cancer.  Lung cancer screening may help those who have the highest risk for lung cancer--people age 74 and older who are or were heavy smokers. For most people, who aren't at increased risk, screening for lung cancer probably isn't helpful.  Screening won't prevent cancer. And it may not find all lung cancers. Lung cancer screening may lower the risk of dying from lung cancer in a small number of people.  How is it done?  Lung cancer screening is done with a low-dose CT (computed tomography) scan. A CT scan uses X-rays, or radiation, to make detailed pictures of your body. Experts recommend that screening be done in medical centers that focus on finding and treating lung cancer.  Who is screening recommended for?  Lung cancer screening is recommended for people age 74 and older who are or were heavy smokers. That means people with a smoking history of at least 20 pack years. A pack year is a way to measure how heavy a smoker you are or were.  To figure out your pack years, multiply how many packs a day on average (assuming 20 cigarettes per pack) you have smoked by how many years you have smoked. For example:  If you smoked 1 pack a day for 20 years, that's 1 times 20. So you have a smoking history of 20 pack years.  If you smoked 2 packs a day for 10 years, that's 2 times 10. So you have a smoking history of 20 pack years.  Experts agree that screening is for people who have a high risk of lung cancer. But experts don't agree on what high risk means. Some say people age 74 or older with at least a 20-pack-year smoking history are high risk. Others say it's people age 55 or older with a 30-pack-year history.  To see if you could benefit from screening, first find out if you are at high risk for lung cancer. Your doctor can help you  decide your lung cancer risk.  What are the risks of screening?  CT screening for lung cancer isn't perfect. It can show an abnormal result when it turns out there wasn't any cancer. This is called a false-positive result. This means you may need more tests to make sure you don't have cancer. These tests can be harmful and cause a lot of worry.  These tests may include more CT scans and invasive testing like a lung biopsy. In a biopsy, the doctor takes a sample of tissue from inside your lung so it can be looked at under a microscope. A biopsy is the only way to tell if you have lung cancer. If the biopsy finds cancer, you and your doctor will have to decide how or whether to treat it.  Some lung cancers found on CT scans are harmless and would not have caused a problem if they had not been found through screening. But because doctors can't tell which ones will turn out to be harmless, most will be treated. This means that you may get treatment--including surgery, radiation, or chemotherapy--that you don't need.  There is a risk of damage to cells or tissue from being exposed to radiation, including the small amounts used in CTs, X-rays, and other medical tests. Over time, exposure to radiation may cause cancer   and other health problems. But in most cases, the risk of getting cancer from being exposed to small amounts of radiation is low. It's not a reason to avoid these tests for most people.  What are the benefits of screening?  Your scan may be normal (negative).  For some people who are at higher risk, screening lowers the chance of dying of lung cancer. How much and how long you smoked helps to determine your risk level. Screening can find some cancers early, when treatment may be more likely to work.  What happens after screening?  The results of your CT scan will be sent to your doctor. Someone from your care team will explain the results of your scan and answer any questions you may have. If you need any  follow-up, he or she will help you understand what to do next.  After a lung cancer screening, you can go back to your usual activities right away.  A lung cancer screening test can't tell if you have lung cancer. If your results are positive, your doctor can't tell whether an abnormal finding is a harmless nodule, cancer, or something else without doing more tests.  What can you do to help prevent lung cancer?  Some lung cancers can't be prevented. But if you smoke, quitting smoking is the best step you can take to prevent lung cancer. If you want to quit, your doctor can recommend medicines or other ways to help.  Follow-up care is a key part of your treatment and safety. Be sure to make and go to all appointments, and call your doctor if you are having problems. It's also a good idea to know your test results and keep a list of the medicines you take.  Where can you learn more?  Go to https://www.healthwise.net/patientEd and enter Q940 to learn more about "Learning About Lung Cancer Screening."  Current as of: Mar 24, 2021??????????????????????????????Content Version: 13.5  ?? 2006-2022 [employer].   Care instructions adapted under license by [facility]. If you have questions about a medical condition or this instruction, always ask your healthcare professional. [employer] Inc[employer] disclaims any warranty or liability for your use of this information.

## 2021-12-22 NOTE — Progress Notes (Signed)
[facility], [place] [address]  Phone:  [phone]  Fax:  [phone]    CHIEF COMPLAINT:  Chief Complaint   Patient presents with    Follow-up          HISTORY OF PRESENT ILLNESS:  Oscar Turner is a 76 y.o. male  who presents for follow-up of  hyperlipidemia. Doing well on rosuvastatin.  Also sees Dr. Cindi Carbon for hypogonadism following treatment for prostate cancer.  PSA remains undetectable.  Cholesterol numbers are good.  Still has mild elevation of hemoglobin.  Is due for low-dose lung CT screening this summer.  No symptoms of cancer.  Wife in fact just had resection of the left upper lobe for lung cancer without metastasis.  Doing well.    HISTORY:  No Known Allergies  Past Medical History:   Diagnosis Date    Prostate cancer ([place])     Skin cancer of face     s/p excision      Current Outpatient Medications   Medication Sig Dispense Refill    testosterone cypionate (DEPOTESTOTERONE CYPIONATE) 200 MG/ML injection Inject 1 mL into the muscle every 14 days for 90 days. 2 mL 5    rosuvastatin (CRESTOR) 20 MG tablet Take 1 tablet by mouth daily 90 tablet 1    sildenafil (VIAGRA) 100 MG tablet Take 1 tablet by mouth as needed for Erectile Dysfunction 8 tablet 11    ibuprofen (ADVIL;MOTRIN) 800 MG tablet 1 tablet with food or milk Orally BID x 2 weeks, then BID prn      Multiple Vitamins-Minerals (MULTI FOR HIM) TABS Take by mouth      Garlic 573 MG CAPS Take by mouth      Omega-3 Fatty Acids (FISH OIL) 500 MG CAPS Take 1 capsule by mouth 2 times daily      turmeric 500 MG CAPS Take by mouth As directed       No current facility-administered medications for this visit.      Past Surgical History:   Procedure Laterality Date    [place]    OTHER SURGICAL HISTORY  08/2014    Compression fracture of L4    PROSTATECTOMY  02/2011    radical    ROTATOR CUFF REPAIR Right 10/11/2018    Dr. Kayleen Memos    SHOULDER ARTHROSCOPY W/ ROTATOR CUFF REPAIR Left  05/11/2017    Dr. Maryfrances Bunnell    TONSILLECTOMY        Family History   Problem Relation Age of Onset    Other Mother         Dialysis    Stroke Father     Coronary Art Dis Father     Lung Disease Father     Hypertension Father     Gout Father     Alcohol Abuse Brother     No Known Problems Brother     No Known Problems Daughter     No Known Problems Daughter     No Known Problems Other       Social History     Socioeconomic History    Marital status: Married     Spouse name: Not on file    Number of children: Not on file    Years of education: Not on file    Highest education level: Not on file   Occupational History    Not on  file   Tobacco Use    Smoking status: Former     Packs/day: 1.00     Years: 30.00     Pack years: 30.00     Types: Cigarettes     Quit date: 11/22/2015     Years since quitting: 6.0    Smokeless tobacco: Never    Tobacco comments:     pack years 80; Patient counselled on the dangers of tobacco 12/18/2020   Substance and Sexual Activity    Alcohol use: Yes     Comment: 2-4 times a month; 1-2 drinks    Drug use: Never    Sexual activity: Not on file   Other Topics Concern    Not on file   Social History Narrative    Not on file     Social Determinants of Health     Financial Resource Strain: Not on file   Food Insecurity: Not on file   Transportation Needs: Not on file   Physical Activity: Not on file   Stress: Not on file   Social Connections: Not on file   Intimate Partner Violence: Not on file   Housing Stability: Not on file          Review of Systems   Constitutional:  Negative for chills, fatigue and fever.   Respiratory:  Negative for cough, chest tightness and shortness of breath.    Cardiovascular:  Negative for chest pain and palpitations.   Gastrointestinal:  Negative for abdominal pain, diarrhea and nausea.   Musculoskeletal:  Negative for back pain, joint swelling and myalgias.   Skin:  Negative for color change and rash.   Neurological:  Negative for dizziness, light-headedness  and headaches.   Psychiatric/Behavioral:  Negative for behavioral problems.       Review of Systems is as indicated above and in HPI, otherwise negative    PHYSICAL EXAM:  Vital Signs:  BP 132/82    Pulse 85    Resp 18    Wt 171 lb (77.6 kg)    SpO2 97%    BMI 26.00 kg/m??        Physical Exam  Constitutional:       Appearance: Normal appearance.   HENT:      Head: Normocephalic and atraumatic.      Mouth/Throat:      Mouth: Mucous membranes are moist.      Pharynx: Oropharynx is clear.   Cardiovascular:      Rate and Rhythm: Normal rate and regular rhythm.      Pulses: Normal pulses.      Heart sounds: S1 normal and S2 normal.   Pulmonary:      Effort: Pulmonary effort is normal.      Breath sounds: Normal breath sounds. No stridor. No wheezing or rhonchi.   Abdominal:      General: Abdomen is flat. Bowel sounds are normal. There is no distension.      Palpations: Abdomen is soft.   Musculoskeletal:         General: Normal range of motion.      Cervical back: Normal range of motion and neck supple.      Right lower leg: No edema.      Left lower leg: No edema.   Skin:     General: Skin is warm and dry.   Neurological:      General: No focal deficit present.      Mental Status: He is alert and oriented to person,  place, and time.      Gait: Gait normal.   Psychiatric:         Behavior: Behavior normal.        PHQ:  PHQ-9  12/22/2021   Little interest or pleasure in doing things 0   Feeling down, depressed, or hopeless 0   PHQ-2 Score 0   PHQ-9 Total Score 0        LABS:  No results found for this visit on 12/22/21.  Orders Only on 12/20/2021   Component Date Value Ref Range Status    WBC 12/20/2021 6.6  3.8 - 10.6 x10e3/mcL Final    RBC 12/20/2021 5.53  4.00 - 5.60 x10e6/mcL Final    Hemoglobin 12/20/2021 17.6 (A)  13.0 - 17.3 g/dL Final    Hematocrit 12/20/2021 50.1  38.0 - 52.0 % Final    MCV 12/20/2021 90.6  84.0 - 100.0 fL Final    MCH 12/20/2021 31.8  27.0 - 34.5 pg Final    MCHC 12/20/2021 35.1  32.0 - 36.0 g/dL  Final    RDW 12/20/2021 13.2  11.0 - 16.0 % Final    Platelets 12/20/2021 235  140 - 440 x10e3/mcL Final    MPV 12/20/2021 10.3  7.2 - 13.2 fL Final    NRBC Automated 12/20/2021 0.0  0.0 - 0.2 % Final    NRBC Absolute 12/20/2021 0.000  0.000 - 0.012 x10e3/mcL Final    Neutrophils % 12/20/2021 60.1  42.0 - 74.0 % Final    Lymphocytes 12/20/2021 27.7  15.0 - 45.0 % Final    Monocytes 12/20/2021 9.3  4.0 - 12.0 % Final    Eosinophils % 12/20/2021 2.0  0.0 - 7.0 % Final    Basophils % 12/20/2021 0.6  0.0 - 2.0 % Final    Neutrophils Absolute 12/20/2021 4.0  1.6 - 7.3 x10e3/mcL Final    Absolute Lymph # 12/20/2021 1.8  1.0 - 3.2 x10e3/mcL Final    Absolute Mono # 12/20/2021 0.6  0.3 - 1.0 x10e3/mcL Final    Absolute Eos # 12/20/2021 0.1  0.0 - 0.5 x10e3/mcL Final    Absolute Baso # 12/20/2021 0.0  0.0 - 0.2 x10e3/mcL Final    Immature Granulocytes 12/20/2021 0.3  0.0 - 0.6 % Final    Immature Grans (Abs) 12/20/2021 0.02  0.00 - 0.06 x10e3/mcL Final    PSA (Serial Monitor) 12/20/2021 <0.1  0.0 - 4.0 ng/mL Final    Comment: Roche ECLIA methodology.    According to the American Urological Association, Serum PSA  should decrease and remain at undetectable levels after  radical prostatectomy. The AUA defines biochemical  recurrence as an initial PSA value 0.2 ng/mL or greater  followed by a subsequent confirmatory PSA value 0.2 ng/mL  or greater. Values obtained with different assay methods or  kits cannot be used interchangeably. Results cannot be  interpreted as absolute evidence of the presence or absence  of malignant disease.  Performed At: [facility]  [place], [place] [id number]  Rush Farmer MD IZ:[id number]      Testosterone 12/20/2021 432.0  193.0 - 740.0 ng/dL Final    Cholesterol 12/20/2021 133  100 - 200 mg/dL Final    Comment: The National Cholesterol Education Program has published reference  cholesterol values for cardiovascular risk to be:    Less than 200 mg/dL     = Low Risk    200  to 239 mg/dL        = Borderline  Risk    264m/dL and greater    = High Risk      HDL 12/20/2021 49  >=40 mg/dL Final    Comment: The National Lipid Association and the N[employer]Cholesterol Education Program  (NCEP) have set the guidelines for high-density lipoprotein (HDL) cholesterol  in adults ages 164and up.      Triglycerides 12/20/2021 86  0 - 149 mg/dL Final    Comment:   TRIGLYCERIDE INTERPRETATION:                          Recommended Fasting Triglyc Levels for Adults                        =============================================                        Desirable                        < 150 mg/dL                        Average                          < 200 mg/dL                        Borderline High             200 to 500 mg/dL                        Hypertriglyceridemic             > 500 mg/dL                        =============================================      LDL Cholesterol 12/20/2021 66.8  0.0 - 100.0 mg/dL Final    LDL/HDL Ratio 12/20/2021 1.4   Final    Chol/HDL Ratio 12/20/2021 2.7  0.0 - 4.4 Final    VLDL 12/20/2021 17.2  5.0 - 40.0 mg/dL Final    Sodium 12/20/2021 138  135 - 145 mmol/L Final    Potassium 12/20/2021 4.3  3.5 - 5.3 mmol/L Final    Chloride 12/20/2021 102  98 - 107 mmol/L Final    CO2 12/20/2021 26  22 - 29 mmol/L Final    Glucose 12/20/2021 104 (A)  70 - 99 mg/dL Final    BUN 12/20/2021 16  8 - 23 mg/dL Final    Creatinine 12/20/2021 1.1  0.7 - 1.3 mg/dL Final    Anion Gap 12/20/2021 10  2 - 17 mmol/L Final    OSMOLALITY CALCULATED 12/20/2021 277  270 - 287 mOsm/kg Final    Calcium 12/20/2021 9.1  8.8 - 10.2 mg/dL Final    Total Protein 12/20/2021 6.3 (A)  6.4 - 8.3 g/dL Final    Albumin 12/20/2021 4.3  3.5 - 5.2 g/dL Final    Globulin 12/20/2021 2.0  1.9 - 4.4 g/dL Final    Albumin/Globulin Ratio 12/20/2021 2.20  1.00 - 2.70 Final    Total Bilirubin 12/20/2021 0.41  0.00 - 1.20 mg/dL Final    Alk Phosphatase 12/20/2021 61  40 - 130 unit/L Final    AST 12/20/2021 23  0  - 50 unit/L  Final    ALT 12/20/2021 39  0 - 50 unit/L Final    Est, Glom Filt Rate 12/20/2021 71 (A)  >=90 mL/min/1.48m? Final    Comment: VERIFIED by Discern Expert.  GFR Interpretation:                                                                         % OF  KIDNEY  GFR                                                        STAGE  FUNCTION  ==================================================================================    > 90        Normal kidney function                       STAGE 1  90-100%  89 to 60      Mild loss of kidney function                 STAGE 2  80-60%  59 to 45      Mild to moderate loss of kidney function     STAGE 3a  59-45%  44 to 30      Moderate to severe loss of kidney function   STAGE 3b  44-30%  29 to 15      Severe loss of kidney function               STAGE 4  29-15%    < 15        Kidney failure                               STAGE 5  <15%  ==================================================================================  Modified from [facility]ard   GFR Calculation performed using the CKD-EPI 2021 equation developed for use  with IDMS traceable creatinine methods and                            is the calculation recommended by  the [facility] Centerfor estimating GFR in adults.         IMPRESSION/PLAN:  1. Hyperlipidemia LDL goal <100  -     Lipid Panel; Future  -     Comprehensive Metabolic Panel; Future  2. Polycythemia  -     CBC with Auto Differential; Future  3. Chronic insomnia  Comments:  Uses Ambien sporadically  4. Personal history of tobacco use  -     PR VISIT TO DISCUSS LUNG CA SCREEN W LDCT  -     CT Lung Screen (Annual/Baseline); Future  5. Prostate cancer ([facility]  Comments:  Followed by Dr. PCindi Carbon  No evidence of recurrence  Orders:  -     PSA, Serial; Future  -     Testosterone; Future  6. Hypogonadism in male  -     Testosterone; Future     Appears to be doing very well.  Will  order refills before his wellness visit in 6 months needed.   Continue healthy lifestyle measures including walking regularly and following a healthy, Mediterranean style diet.    Follow up and Dispositions:  Return in about 6 months (around 06/21/2022) for [place].         Lowell Guitar, MD      This note was dictated using voice recognition software. While it was proofread, unrecognized voice recognition errors may still exist.      Discussed with the patient the current USPSTF guidelines released January 28, 2020 for screening for lung cancer.    For adults aged 92 to 37 years who have a 20 pack-year smoking history and currently smoke or have quit within the past 15 years the grade B recommendation is to:  Screen for lung cancer with low-dose computed tomography (LDCT) every year.  Stop screening once a person has not smoked for 15 years or has a health problem that limits life expectancy or the ability to have lung surgery.    The patient  reports that he quit smoking about 6 years ago. His smoking use included cigarettes. He has a 30.00 pack-year smoking history. He has never used smokeless tobacco.. Discussed with patient the risks and benefits of screening, including over-diagnosis, false positive rate, and total radiation exposure.  The patient currently exhibits no signs or symptoms suggestive of lung cancer.  Discussed with patient the importance of compliance with yearly annual lung cancer screenings and willingness to undergo diagnosis and treatment if screening scan is positive.  In addition, the patient was counseled regarding the importance of remaining smoke free and/or total smoking cessation.    Also reviewed the following if the patient has Medicare that as of December 31, 2020, Medicare only covers LDCT screening in patients aged 50-77 with at least a 20 pack-year smoking history who currently smoke or have quit in the last 15 years

## 2022-01-01 ENCOUNTER — Other Ambulatory Visit: Payer: Self-pay | Admitting: Internal Medicine

## 2022-01-01 DIAGNOSIS — N4889 Other specified disorders of penis: Secondary | ICD-10-CM | POA: Insufficient documentation

## 2022-01-04 ENCOUNTER — Other Ambulatory Visit (INDEPENDENT_AMBULATORY_CARE_PROVIDER_SITE_OTHER): Payer: Medicare Other

## 2022-01-04 DIAGNOSIS — N4889 Other specified disorders of penis: Secondary | ICD-10-CM

## 2022-01-04 LAB — URINALYSIS, ROUTINE W REFLEX MICROSCOPIC
Bilirubin Urine: NEGATIVE
Hgb urine dipstick: NEGATIVE
Ketones, ur: NEGATIVE
Nitrite: NEGATIVE
RBC / HPF: NONE SEEN (ref 0–?)
Specific Gravity, Urine: >=1.03 — AB (ref 1.000–1.030)
Total Protein, Urine: 30 — AB
Urine Glucose: NEGATIVE
Urobilinogen, UA: 0.2 (ref 0.0–1.0)
pH: 6 (ref 5.0–8.0)

## 2022-01-08 ENCOUNTER — Other Ambulatory Visit: Payer: Self-pay | Admitting: Internal Medicine

## 2022-01-08 DIAGNOSIS — N341 Nonspecific urethritis: Secondary | ICD-10-CM | POA: Insufficient documentation

## 2022-01-08 MED ORDER — DOXYCYCLINE HYCLATE 100 MG PO TABS: 100.0000 mg | ORAL_TABLET | Freq: Two times a day (BID) | ORAL | 0 refills | Status: AC

## 2022-01-09 ENCOUNTER — Encounter: Payer: Self-pay | Admitting: Internal Medicine

## 2022-01-19 ENCOUNTER — Ambulatory Visit: Admit: 2022-01-19 | Discharge: 2022-01-19 | Payer: MEDICARE | Attending: Urology | Primary: Family Medicine

## 2022-01-19 LAB — POCT URINALYSIS DIPSTICK
Blood, UA POC: NEGATIVE
Glucose, UA POC: NEGATIVE
Ketones, UA: NEGATIVE
Leukocytes, UA: NEGATIVE
Nitrite, UA: NEGATIVE
Protein, UA POC: NEGATIVE
pH, UA: 7

## 2022-01-19 NOTE — Progress Notes (Signed)
Oscar Turner (DOB:  Jan 20, 1946) is a 76 y.o. male    Chief complaint(s):  Follow-up (3 mo )      History of Present Illness:   July 15, 2019:  History of prostate cancer as well as hypogonadism as below. Returns today  with a PSA of 0.02 which is quite stable. Hemoglobin 16.9. Testosterone is not  back yet. He is using 150 mg of Depo testosterone every 2 weeks. Getting  excellent clinical response. He is using sildenafil for ED when needed is  working very well for him.  December 28, 2018:  Oscar Turner returns today for follow-up of hypogonadism, erectile dysfunction,  Peyronie's disease, history of prostate cancer status post robotic prostatectomy in  2012.  He is still using 150 mg of Depo testosterone every 2 weeks and his most recent  testosterone level was 526 with a free testosterone level 13.68 well within the  middle of normal limits. He feels that he's had a good clinical response.  He is using sildenafil for ED which is working well and occasionally he does not  need it.  September 03, 2018:  Oscar Turner returns today for follow-up. Recall he has a history of prostate cancer,  erectile dysfunction, Peyronie's disease and hypogonadism. He is now using 150  mg of intramuscular Depo testosterone every 2 weeks. He returns with a free  testosterone that is 33.18 mg/dL, significantly above the reference range. He  does feel that he's had very good improvement in his libido and energy since he  started androgen replacement therapy. His hemoglobin was 16.3 and PSA 0.02.  February 16, 2018  Oscar Turner returns today after we reduced his testosterone dose to 150 mg of  Depo testosterone every 2 weeks. His testosterone remains just slightly elevated  with a free testosterone of around 22. His CBC demonstrates that his  hemoglobin has come down slightly to 17.3.  December 22, 2017:  Oscar Turner very nice gentleman returns for follow-up of prostate cancer,  hypogonadism, erectile dysfunction. He also has Peyronie's  disease. As far as his  hypogonadism goes he is using 200 mg of Depo testosterone every 2 weeks. He  has had great clinical improvement in his symptoms including his libido. He  returns with a testosterone level it is above the normal range 1033 as well as a  slightly elevated hemoglobin of 17.7. His PSA is also detectable but only in the  ultrasensitive range at 0.016.  He is using generic sildenafil for his ED which is working sufficiently. He does  have Peyronie's disease but feels that it is getting better and it does not impair  intercourse.  June 19, 2017:  This is a 76 year old gentleman who I have been following for hypogonadism as  well as prostate cancer and he is now over 6 years following robotic  prostatectomy. PSA has been undetectable on ultrasensitive assay most recently  in September 2017. He is using 200 mg of Depo testosterone every 2 weeks.  Recent testosterone level was 555 checked about midcycle. He feels that he has  had an excellent clinical response with better energy and libido. He continues to  play golf although this has been put on hiatus due to recent left rotator cuff  surgery. Hemoglobin recently checked was 16.6.  October 32, 4565:  76 year old white male with a history of robotic prostatectomy in 2012 an  undetectable PSA. As a long history of hypogonadism. At one time was on  AndroGel prior to his  diagnosis. At his last visit with me earlier this year he was  complaining of symptoms of hypogonadism. Testosterone was found to be the  low end of normal range. We agreed to go ahead and start him on Depo  testosterone and he is doing 200 mg every 2 weeks. He states that this has made  significant improvements in his lifestyle most notably his libido and energy.  Most recent labs demonstrated a testosterone in the low end of the normal range  but this was drawn at the end of his cycle as well as an undetectable PSA and a  hemoglobin that was just slightly out of the normal range at  17.5.  He is doing very well from a urinary standpoint.    July 20, 2020:  Oscar Turner returns today for follow-up of hypogonadism, history of prostate  cancer, erectile dysfunction. Most recent PSA 0.038 in comparison to 0.02 and  2020. He status post robotic prostatectomy in 2012. PSA has been rising slowly.  He is taking supplemental testosterone and we did discuss the theoretical risks.  Most recent testosterone level was 610 ng/dL on 200 mg of Depo testosterone  every 2 weeks. He has very rare stress incontinence. He gets good erections with  sildenafil.    08/13/2021  The patient is now 76 y.o. He has a h/o low PSA s/p RALP 2012. PSA 0.033. He is doing well on the regimen of testosterone cypionate 200mg /ml 1 ml IM q2weeks. Testosterone 1199. No voiding changes, hematuria, unintended weight loss or bone pain.     01/19/2022  He is feeling well. Good energy. Injecting testosterone cypionate 200mg /ml 0.66ml IM q2weeks. Testosterone midcycle 432. PSA <0.1    07/12/2019 PSA 0.021 Testosterone 250  07/13/2020 PSA 0.038 Testosterone 610  08/09/2021 PSA 0.033 Testosterone 1199 Hgb 17.7   12/20/2021 PSA <0.1 Testosterone 432 Hgb 17.6      Current Outpatient Medications:     testosterone cypionate (DEPOTESTOTERONE CYPIONATE) 200 MG/ML injection, Inject 1 mL into the muscle every 14 days for 90 days., Disp: 2 mL, Rfl: 5    rosuvastatin (CRESTOR) 20 MG tablet, Take 1 tablet by mouth daily, Disp: 90 tablet, Rfl: 1    sildenafil (VIAGRA) 100 MG tablet, Take 1 tablet by mouth as needed for Erectile Dysfunction, Disp: 8 tablet, Rfl: 11    ibuprofen (ADVIL;MOTRIN) 800 MG tablet, 1 tablet with food or milk Orally BID x 2 weeks, then BID prn, Disp: , Rfl:     Multiple Vitamins-Minerals (MULTI FOR HIM) TABS, Take by mouth, Disp: , Rfl:     Garlic 732 MG CAPS, Take by mouth, Disp: , Rfl:     Omega-3 Fatty Acids (FISH OIL) 500 MG CAPS, Take 1 capsule by mouth 2 times daily, Disp: , Rfl:     turmeric 500 MG CAPS, Take by mouth As directed,  Disp: , Rfl:     Past Medical History:   Diagnosis Date    Prostate cancer ([place])     Skin cancer of face     s/p excision        Past Surgical History:   Procedure Laterality Date    [place]    OTHER SURGICAL HISTORY  08/2014    Compression fracture of L4    PROSTATECTOMY  02/2011    radical    ROTATOR CUFF REPAIR Right 10/11/2018    Dr. Kayleen Memos    SHOULDER ARTHROSCOPY W/ ROTATOR CUFF REPAIR Left 05/11/2017  Dr. Maryfrances Bunnell    TONSILLECTOMY         Family History   Problem Relation Age of Onset    Other Mother         Dialysis    Stroke Father     Coronary Art Dis Father     Lung Disease Father     Hypertension Father     Gout Father     Alcohol Abuse Brother     No Known Problems Brother     No Known Problems Daughter     No Known Problems Daughter     No Known Problems Other        Social History     Socioeconomic History    Marital status: Married     Spouse name: Not on file    Number of children: Not on file    Years of education: Not on file    Highest education level: Not on file   Occupational History    Not on file   Tobacco Use    Smoking status: Former     Packs/day: 1.00     Years: 30.00     Pack years: 30.00     Types: Cigarettes     Quit date: 11/22/2015     Years since quitting: 6.1    Smokeless tobacco: Never    Tobacco comments:     pack years 19; Patient counselled on the dangers of tobacco 12/18/2020   Substance and Sexual Activity    Alcohol use: Yes     Comment: 2-4 times a month; 1-2 drinks    Drug use: Never    Sexual activity: Not on file   Other Topics Concern    Not on file   Social History Narrative    Not on file     Social Determinants of Health     Financial Resource Strain: Not on file   Food Insecurity: Not on file   Transportation Needs: Not on file   Physical Activity: Not on file   Stress: Not on file   Social Connections: Not on file   Intimate Partner Violence: Not on file   Housing Stability: Not on file       Allergies:  No Known Allergies    There were no  vitals filed for this visit.    ROS:    General/Constitutional:           Fever denies.  Weight loss denies.          Endocrine:           Diabetes denies.          Respiratory:           Shortness of breath denies.          Cardiovascular:           Chest pain denies.          Gastrointestinal:           Abdominal pain denies.  Nausea denies.          Musculoskeletal:           Weakness denies.          Hematology:           Prolonged bleeding denies.           Skin:           Rash denies.          Genitourinary:  Frequent urination denies.          Neurologic:           Stroke denies.          Psychiatric:           Depression denies.          Dermatology:           Skin rash denies.      Physical Exam:  General Examination:  GENERAL APPEARANCE: well developed, well nourished. No acute distress.  HEAD: normocephalic, atraumatic.  EYES: pupils equal, round, reactive to light.  HEART: normal.  LUNGS: normal, good air movement, not requiring secondary accessory muscles.  ABDOMEN: normal, no masses palpable, soft, nontender, nondistended.  SKIN: no suspicious lesions, warm and dry.  MUSCULOSKELETAL: full range of motion, normal gait.  EXTREMITIES: no edema.  PSYCH: alert, oriented, cognitive function intact    Lab Results   Component Value Date    PSA 0.034 11/10/2021    PSA 0.033 08/09/2021    PSA 0.038 07/13/2020           Lab Results   Component Value Date    WBC 6.6 12/20/2021    HGB 17.6 (H) 12/20/2021    HCT 50.1 12/20/2021    MCV 90.6 12/20/2021    PLT 235 12/20/2021        Lab Results   Component Value Date/Time    ALKPHOS 61 12/20/2021 10:49 AM    ALT 39 12/20/2021 10:49 AM    AST 23 12/20/2021 10:49 AM    PROT 6.3 12/20/2021 10:49 AM    BILITOT 0.41 12/20/2021 10:49 AM    BILIDIR <0.20 11/10/2021 12:18 PM    LABALBU 4.3 12/20/2021 10:49 AM        Lab Results   Component Value Date    TESTOSTERONE 610 07/13/2020          No results found for requested labs within last 30 days.       Results for  orders placed or performed in visit on 01/19/22   POCT urinalysis dipstick   Result Value Ref Range    Color, UA yellow     Clarity, UA clear     Glucose, UA POC neg     Bilirubin, UA      Ketones, UA neg     Spec Grav, UA      Blood, UA POC neg     pH, UA 7     Protein, UA POC neg     Urobilinogen, UA      Leukocytes, UA neg     Nitrite, UA neg        ASSESSMENT/PLAN:  1. Hypogonadism in male  -     Hepatic Function Panel; Future  -     Testosterone; Future  2. Prostate cancer ([place])  -     PSA, Diagnostic; Future  -     POCT urinalysis dipstick  3. Polycythemia  -     CBC; Future  4. Erectile dysfunction after radical prostatectomy    Continue lower testosterone of 0.8 ml IM q2weeks. Recheck labs and mid cycle draw in 6 months.    Continue sildenafil to manage ED.      Electronically signed by:  --Elwin Sleight, MD

## 2022-02-08 DIAGNOSIS — Z86010 Personal history of colonic polyps: Secondary | ICD-10-CM | POA: Diagnosis not present

## 2022-02-08 DIAGNOSIS — K5730 Diverticulosis of large intestine without perforation or abscess without bleeding: Secondary | ICD-10-CM | POA: Diagnosis not present

## 2022-02-16 DIAGNOSIS — M542 Cervicalgia: Secondary | ICD-10-CM | POA: Diagnosis not present

## 2022-02-16 DIAGNOSIS — M545 Low back pain, unspecified: Secondary | ICD-10-CM | POA: Diagnosis not present

## 2022-02-21 ENCOUNTER — Telehealth: Payer: Self-pay | Admitting: Internal Medicine

## 2022-02-21 NOTE — Telephone Encounter (Signed)
N/A unable to leave a message for patient to call back to schedule Medicare Annual Wellness Visit  ? ?Last AWV  02/08/21 ? ?Please schedule at anytime with [facility] if patient calls the office back.   ? ? ?Any questions, please call me at [phone]  ?

## 2022-03-07 ENCOUNTER — Ambulatory Visit (INDEPENDENT_AMBULATORY_CARE_PROVIDER_SITE_OTHER): Payer: Medicare Other | Admitting: Internal Medicine

## 2022-03-07 ENCOUNTER — Encounter: Payer: Self-pay | Admitting: Internal Medicine

## 2022-03-07 VITALS — BP 134/78 | HR 65 | Temp 97.8°F | Ht 74.0 in | Wt 206.0 lb

## 2022-03-07 DIAGNOSIS — Z21 Asymptomatic human immunodeficiency virus [HIV] infection status: Secondary | ICD-10-CM

## 2022-03-07 DIAGNOSIS — R739 Hyperglycemia, unspecified: Secondary | ICD-10-CM

## 2022-03-07 DIAGNOSIS — E785 Hyperlipidemia, unspecified: Secondary | ICD-10-CM

## 2022-03-07 DIAGNOSIS — E039 Hypothyroidism, unspecified: Secondary | ICD-10-CM | POA: Diagnosis not present

## 2022-03-07 LAB — HEPATIC FUNCTION PANEL
ALT: 17 U/L (ref 0–53)
AST: 18 U/L (ref 0–37)
Albumin: 4.3 g/dL (ref 3.5–5.2)
Alkaline Phosphatase: 59 U/L (ref 39–117)
Bilirubin, Direct: 0.2 mg/dL (ref 0.0–0.3)
Total Bilirubin: 1.1 mg/dL (ref 0.2–1.2)
Total Protein: 6.8 g/dL (ref 6.0–8.3)

## 2022-03-07 LAB — BASIC METABOLIC PANEL
BUN: 19 mg/dL (ref 6–23)
CO2: 28 mEq/L (ref 19–32)
Calcium: 9.3 mg/dL (ref 8.4–10.5)
Chloride: 104 mEq/L (ref 96–112)
Creatinine, Ser: 1 mg/dL (ref 0.40–1.50)
GFR: 74.47 mL/min (ref >60.00)
Glucose, Bld: 103 mg/dL — ABNORMAL HIGH (ref 70–99)
Potassium: 4.1 mEq/L (ref 3.5–5.1)
Sodium: 139 mEq/L (ref 135–145)

## 2022-03-07 LAB — CBC WITH DIFFERENTIAL/PLATELET
Basophils Absolute: 0.1 10*3/uL (ref 0.0–0.1)
Basophils Relative: 1.4 % (ref 0.0–3.0)
Eosinophils Absolute: 0.1 10*3/uL (ref 0.0–0.7)
Eosinophils Relative: 2.2 % (ref 0.0–5.0)
HCT: 42.9 % (ref 39.0–52.0)
Hemoglobin: 14.9 g/dL (ref 13.0–17.0)
Lymphocytes Relative: 19 % (ref 12.0–46.0)
Lymphs Abs: 1.1 10*3/uL (ref 0.7–4.0)
MCHC: 34.7 g/dL (ref 30.0–36.0)
MCV: 94.1 fl (ref 78.0–100.0)
Monocytes Absolute: 0.5 10*3/uL (ref 0.1–1.0)
Monocytes Relative: 9.2 % (ref 3.0–12.0)
Neutro Abs: 3.8 10*3/uL (ref 1.4–7.7)
Neutrophils Relative %: 68.2 % (ref 43.0–77.0)
Platelets: 171 10*3/uL (ref 150.0–400.0)
RBC: 4.56 Mil/uL (ref 4.22–5.81)
RDW: 13.6 % (ref 11.5–15.5)
WBC: 5.6 10*3/uL (ref 4.0–10.5)

## 2022-03-07 LAB — HEMOGLOBIN A1C: Hgb A1c MFr Bld: 6 % (ref 4.6–6.5)

## 2022-03-07 LAB — TSH: TSH: 1.05 u[IU]/mL (ref 0.35–5.50)

## 2022-03-07 NOTE — Progress Notes (Signed)
? ?Subjective:  ?Patient ID: Zachary Levy, male    DOB: [birth year]  Age: 76 y.o. MRN: [id number] ? ?CC: Hypothyroidism ? ? ?HPI ?[name] presents for f/up - ? ?He can walk 2 to 3 hours a day and does not experience chest pain, shortness of breath, diaphoresis, or edema. ? ?Outpatient Medications Prior to Visit  ?Medication Sig Dispense Refill  ? Emtricitab-Rilpivir-Tenofov AF (ODEFSEY PO) Take by mouth.    ? levothyroxine (SYNTHROID) 137 MCG tablet Take 1 tablet (137 mcg total) by mouth daily before breakfast. 90 tablet 1  ? mirabegron ER (MYRBETRIQ) 50 MG TB24 tablet Take 1 tablet (50 mg total) by mouth daily. 90 tablet 0  ? raltegravir (ISENTRESS) 400 MG tablet Take 400 mg by mouth 2 (two) times daily.    ? ?No facility-administered medications prior to visit.  ? ? ?ROS ?Review of Systems  ?Constitutional:  Negative for diaphoresis and fatigue.  ?HENT: Negative.    ?Eyes: Negative.   ?Respiratory:  Negative for cough, chest tightness, shortness of breath and wheezing.   ?Cardiovascular:  Negative for chest pain, palpitations and leg swelling.  ?Gastrointestinal:  Negative for abdominal pain, constipation, diarrhea, nausea and vomiting.  ?Endocrine: Negative.  Negative for cold intolerance and heat intolerance.  ?Genitourinary: Negative.   ?Musculoskeletal: Negative.   ?Skin: Negative.   ?Neurological:  Negative for dizziness, weakness, light-headedness and headaches.  ?Hematological:  Negative for adenopathy. Does not bruise/bleed easily.  ?Psychiatric/Behavioral: Negative.    ? ?Objective:  ?BP 134/78 (BP Location: Right Arm, Patient Position: Sitting, Cuff Size: Large)   Pulse 65   Temp 97.8 ?F (36.6 ?C) (Oral)   Ht '6\' 2"'$  (1.88 m)   Wt 206 lb (93.4 kg)   SpO2 98%   BMI 26.45 kg/m?  ? ?BP Readings from Last 3 Encounters:  ?03/07/22 134/78  ?12/13/21 128/84  ?06/08/21 112/74  ? ? ?Wt Readings from Last 3 Encounters:  ?03/07/22 206 lb (93.4 kg)  ?12/13/21 208 lb (94.3 kg)  ?06/08/21 201 lb (91.2 kg)   ? ? ?Physical Exam ?Vitals reviewed.  ?HENT:  ?   Nose: Nose normal.  ?   Mouth/Throat:  ?   Mouth: Mucous membranes are moist.  ?Eyes:  ?   General: No scleral icterus. ?   Conjunctiva/sclera: Conjunctivae normal.  ?Cardiovascular:  ?   Rate and Rhythm: Normal rate and regular rhythm.  ?   Heart sounds: No murmur heard. ?Pulmonary:  ?   Effort: Pulmonary effort is normal.  ?   Breath sounds: No stridor. No wheezing, rhonchi or rales.  ?Abdominal:  ?   General: Abdomen is flat.  ?   Palpations: There is no mass.  ?   Tenderness: There is no abdominal tenderness. There is no guarding.  ?   Hernia: No hernia is present.  ?Musculoskeletal:     ?   General: Normal range of motion.  ?   Cervical back: Neck supple.  ?   Right lower leg: No edema.  ?   Left lower leg: No edema.  ?Lymphadenopathy:  ?   Cervical: No cervical adenopathy.  ?Skin: ?   General: Skin is warm and dry.  ?Neurological:  ?   General: No focal deficit present.  ?   Mental Status: He is alert.  ?Psychiatric:     ?   Mood and Affect: Mood normal.     ?   Behavior: Behavior normal.  ? ? ?Lab Results  ?Component Value Date  ? WBC 5.6  03/07/2022  ? HGB 14.9 03/07/2022  ? HCT 42.9 03/07/2022  ? PLT 171.0 03/07/2022  ? GLUCOSE 103 (H) 03/07/2022  ? CHOL 231 (H) 05/11/2021  ? TRIG 55.0 05/11/2021  ? HDL 72.00 05/11/2021  ? LDLCALC 148 (H) 05/11/2021  ? ALT 17 03/07/2022  ? AST 18 03/07/2022  ? NA 139 03/07/2022  ? K 4.1 03/07/2022  ? CL 104 03/07/2022  ? CREATININE 1.00 03/07/2022  ? BUN 19 03/07/2022  ? CO2 28 03/07/2022  ? TSH 1.05 03/07/2022  ? PSA 3.09 05/11/2021  ? INR 1.0 02/12/2007  ? HGBA1C 6.0 03/07/2022  ? ? ?DG Cervical Spine Complete ? ?Result Date: 04/09/2013 ?*RADIOLOGY REPORT* Clinical Data: Neck and arm pain, no trauma CERVICAL SPINE - COMPLETE 4+ VIEW Comparison: None. Findings: The cervical vertebrae are in normal alignment.  There is degenerative disc disease at C5-6 and C6-7 with some loss of disc space, sclerosis, and spurring.  No  prevertebral soft tissue swelling is seen.  There is mild foraminal narrowing at C5-6 with no significant narrowing at C6-7.  The odontoid process is intact. The lung apices are clear. IMPRESSION: Degenerative disc disease at C5-6 and C6-7 with mild foraminal narrowing at C5-6. Original Report Authenticated By: Ivar Drape, M.D.  ? ?DG Knee 1-2 Views Left ? ?Result Date: 04/09/2013 ?*RADIOLOGY REPORT* Clinical Data: Knee pain, no trauma LEFT KNEE - 1-2 VIEW Comparison: None. Findings: There may be very minimal decrease in medial joint space. No significant degenerative spurring is seen although there is a tiny spur emanating from the superior aspect of the patella.  No fracture is seen and no effusion is noted. IMPRESSION: Minimal degenerative change with some loss of medial joint space and minimal patellar spurring. Original Report Authenticated By: Ivar Drape, M.D.  ? ?DG Knee 1-2 Views Right ? ?Result Date: 04/09/2013 ?*RADIOLOGY REPORT* Clinical Data: Knee pain, no trauma RIGHT KNEE - 1-2 VIEW Comparison: None. Findings: Very minimal degenerative spurring is noted at the patellofemoral articulation.  No significant degenerative change is seen.  The joint spaces are relatively well preserved for age.  No effusion is noted. IMPRESSION: Minimal patellar spurring.  No significant degenerative change. Original Report Authenticated By: Ivar Drape, M.D.  ? ? ?Assessment & Plan:  ? ?Danyell was seen today for hypothyroidism. ? ?Diagnoses and all orders for this visit: ? ?Adult hypothyroidism- His TSH is in the normal range. Will continue the current T4 dose. ?-     TSH; Future ?-     CBC with Differential/Platelet; Future ?-     Hepatic function panel; Future ?-     Hepatic function panel ?-     CBC with Differential/Platelet ?-     TSH ? ?Hyperglycemia ?-     CBC with Differential/Platelet; Future ?-     Hepatic function panel; Future ?-     Basic metabolic panel; Future ?-     Hemoglobin A1c; Future ?-     Hemoglobin  A1c ?-     Basic metabolic panel ?-     Hepatic function panel ?-     CBC with Differential/Platelet ? ?Asymptomatic HIV infection, with no history of HIV-related illness ([place]) ?-     RPR; Future ?-     CBC with Differential/Platelet; Future ?-     HIV-1 RNA quant-no reflex-bld; Future ?-     Cancel: T-helper cells (CD4) count (not at [facility]); Future ?-     Hepatic function panel; Future ?-     T-helper cells (  CD4) count (not at [facility]); Future ?-     T-helper cells (CD4) count (not at [facility]) ?-     Hepatic function panel ?-     HIV-1 RNA quant-no reflex-bld ?-     CBC with Differential/Platelet ?-     RPR ? ?Dyslipidemia, goal LDL below 130 ?-     CBC with Differential/Platelet; Future ?-     Hepatic function panel; Future ?-     Hepatic function panel ?-     CBC with Differential/Platelet ? ? ?I am having [name] maintain his raltegravir, Emtricitab-Rilpivir-Tenofov AF (ODEFSEY PO), mirabegron ER, and levothyroxine. ? ?No orders of the defined types were placed in this encounter. ? ? ? ?Follow-up: No follow-ups on file. ? ?Scarlette Calico, MD ?

## 2022-03-17 ENCOUNTER — Encounter

## 2022-03-17 MED ORDER — ROSUVASTATIN CALCIUM 20 MG PO TABS
20 MG | ORAL_TABLET | Freq: Every day | ORAL | 1 refills | Status: AC
Start: 2022-03-17 — End: 2022-06-15

## 2022-03-18 LAB — T-HELPER CELLS (CD4) COUNT (NOT AT ARMC)
Absolute CD4: 472 cells/uL — ABNORMAL LOW (ref 490–1740)
CD4 T Helper %: 40 % (ref 30–61)
Total lymphocyte count: 1179 cells/uL (ref 850–3900)

## 2022-03-18 LAB — HIV-1 RNA QUANT-NO REFLEX-BLD

## 2022-03-18 LAB — RPR: RPR Ser Ql: NONREACTIVE

## 2022-03-19 ENCOUNTER — Encounter: Payer: Self-pay | Admitting: Internal Medicine

## 2022-03-21 ENCOUNTER — Other Ambulatory Visit: Payer: Self-pay | Admitting: Internal Medicine

## 2022-03-21 ENCOUNTER — Other Ambulatory Visit: Payer: Medicare Other

## 2022-03-21 DIAGNOSIS — Z21 Asymptomatic human immunodeficiency virus [HIV] infection status: Secondary | ICD-10-CM | POA: Diagnosis not present

## 2022-03-26 LAB — HIV-1 RNA QUANT-NO REFLEX-BLD
HIV 1 RNA Quant: 40 copies/mL — ABNORMAL HIGH
HIV-1 RNA Quant, Log: 1.6 Log copies/mL — ABNORMAL HIGH

## 2022-04-14 ENCOUNTER — Encounter

## 2022-04-14 NOTE — Telephone Encounter (Signed)
Can you ask Dr. Cindi Carbon if it is ok to refill, I can probably get [place] to escribe.

## 2022-04-25 ENCOUNTER — Encounter

## 2022-04-25 ENCOUNTER — Ambulatory Visit: Payer: Medicare Other

## 2022-04-26 MED ORDER — TESTOSTERONE CYPIONATE 200 MG/ML IM SOLN
200 | INTRAMUSCULAR | 2 refills | 35.00000 days | Status: AC
Start: 2022-04-26 — End: 2024-08-15

## 2022-04-26 NOTE — Telephone Encounter (Signed)
Sent!

## 2022-04-27 ENCOUNTER — Inpatient Hospital Stay: Admit: 2022-04-27 | Payer: MEDICARE | Primary: Family Medicine

## 2022-04-27 DIAGNOSIS — Z87891 Personal history of nicotine dependence: Secondary | ICD-10-CM

## 2022-04-29 ENCOUNTER — Ambulatory Visit: Admit: 2022-04-29 | Discharge: 2022-04-29 | Payer: MEDICARE | Attending: Family Medicine | Primary: Family Medicine

## 2022-04-29 DIAGNOSIS — E785 Hyperlipidemia, unspecified: Secondary | ICD-10-CM

## 2022-04-29 NOTE — Progress Notes (Signed)
[address] [place] [place] [address]  Phone:  [phone]  Fax:  [phone]    CHIEF COMPLAINT:  Chief Complaint   Patient presents with    Follow-up     Discuss test results          HISTORY OF PRESENT ILLNESS:  Mr. Oscar Turner is a 76 y.o. male  who presents for follow-up of recent low-dose lung CT.  Stop smoking about 4 years ago.  CT shows stable 3 mm left upper lobe nodule and stable 1 mm lower lobe nodule.  Patient denies any dyspnea on exertion.  Does have occasional coughing.  Is concerned about stroke.  Denies any history of sleep apnea or atrial fibrillation/palpitations.  Father had a stroke related to carotid disease.  He has not been screened so I will get an ultrasound.    Does have a history of prostate cancer.  PSAs have been normal.  He was evaluated and is still followed by Dr. Cindi Carbon.  Patient wonders about getting his testosterone injections through our office.  I have explained that this would be up to Dr. Frutoso Schatz who he plans to follow-up with.  Does have an upcoming appointment in August for his wellness visit.  Has labs ordered already.      HISTORY:  No Known Allergies  Past Medical History:   Diagnosis Date    Prostate cancer ([place])     Skin cancer of face     s/p excision      Current Outpatient Medications   Medication Sig Dispense Refill    Coenzyme Q10 10 MG CAPS Take by mouth      testosterone cypionate (DEPOTESTOTERONE CYPIONATE) 200 MG/ML injection Inject 1 mL into the muscle every 14 days for 90 days. 2 mL 2    rosuvastatin (CRESTOR) 20 MG tablet Take 1 tablet by mouth daily 90 tablet 1    sildenafil (VIAGRA) 100 MG tablet Take 1 tablet by mouth as needed for Erectile Dysfunction 8 tablet 11    Multiple Vitamins-Minerals (MULTI FOR HIM) TABS Take by mouth      Garlic 440 MG CAPS Take by mouth      Omega-3 Fatty Acids (FISH OIL) 500 MG CAPS Take 500 mg by mouth 2 times daily      turmeric 500 MG CAPS Take by mouth As directed        No current facility-administered medications for this visit.      Past Surgical History:   Procedure Laterality Date    [place]    OTHER SURGICAL HISTORY  08/2014    Compression fracture of L4    PROSTATECTOMY  02/2011    radical    ROTATOR CUFF REPAIR Right 10/11/2018    Dr. Kayleen Memos    SHOULDER ARTHROSCOPY W/ ROTATOR CUFF REPAIR Left 05/11/2017    Dr. Maryfrances Bunnell    TONSILLECTOMY        Family History   Problem Relation Age of Onset    Other Mother         Dialysis    Stroke Father     Coronary Art Dis Father     Lung Cancer Father     Hypertension Father     Gout Father     Alcohol Abuse Brother     No Known Problems Brother     No Known Problems Daughter  No Known Problems Daughter     No Known Problems Other       Social History     Socioeconomic History    Marital status: Married     Spouse name: Not on file    Number of children: Not on file    Years of education: Not on file    Highest education level: Not on file   Occupational History    Not on file   Tobacco Use    Smoking status: Former     Packs/day: 1.00     Years: 30.00     Pack years: 30.00     Types: Cigarettes     Quit date: 11/22/2015     Years since quitting: 6.4    Smokeless tobacco: Never    Tobacco comments:     pack years 70; Patient counselled on the dangers of tobacco 12/18/2020   Substance and Sexual Activity    Alcohol use: Yes     Comment: 2-4 times a month; 1-2 drinks    Drug use: Never    Sexual activity: Not on file   Other Topics Concern    Not on file   Social History Narrative    Not on file     Social Determinants of Health     Financial Resource Strain: Not on file   Food Insecurity: Not on file   Transportation Needs: Not on file   Physical Activity: Not on file   Stress: Not on file   Social Connections: Not on file   Intimate Partner Violence: Not on file   Housing Stability: Not on file          Review of Systems   Constitutional:  Negative for chills, fatigue and fever.   Respiratory:  Negative for  cough, chest tightness and shortness of breath.    Cardiovascular:  Negative for chest pain and palpitations.   Gastrointestinal:  Negative for abdominal pain, diarrhea and nausea.   Musculoskeletal:  Negative for back pain, joint swelling and myalgias.   Skin:  Negative for color change and rash.   Neurological:  Negative for dizziness, light-headedness and headaches.   Psychiatric/Behavioral:  Negative for behavioral problems.       Review of Systems is as indicated above and in HPI, otherwise negative    PHYSICAL EXAM:  Vital Signs:  BP 134/78   Pulse (!) 48   Resp 16   Ht '5\' 8"'$  (1.727 m)   Wt 171 lb (77.6 kg)   SpO2 94%   BMI 26.00 kg/m        Physical Exam  Constitutional:       Appearance: Normal appearance.   HENT:      Head: Normocephalic and atraumatic.      Mouth/Throat:      Mouth: Mucous membranes are moist.      Pharynx: Oropharynx is clear.   Cardiovascular:      Rate and Rhythm: Normal rate and regular rhythm.      Pulses: Normal pulses.      Heart sounds: S1 normal and S2 normal.   Pulmonary:      Effort: Pulmonary effort is normal.      Breath sounds: Normal breath sounds. No stridor. No wheezing or rhonchi.   Abdominal:      General: Abdomen is flat. Bowel sounds are normal. There is no distension.      Palpations: Abdomen is soft.   Musculoskeletal:  General: Normal range of motion.      Cervical back: Normal range of motion and neck supple.      Right lower leg: No edema.      Left lower leg: No edema.   Skin:     General: Skin is warm and dry.   Neurological:      General: No focal deficit present.      Mental Status: He is alert and oriented to person, place, and time.      Gait: Gait normal.   Psychiatric:         Behavior: Behavior normal.        PHQ:  PHQ-9  12/22/2021   Little interest or pleasure in doing things 0   Feeling down, depressed, or hopeless 0   PHQ-2 Score 0   PHQ-9 Total Score 0        LABS:  No results found for this visit on 04/29/22.  Office Visit on 01/19/2022    Component Date Value Ref Range Status    Color, UA 01/19/2022 yellow   Final    Clarity, UA 01/19/2022 clear   Final    Glucose, UA POC 01/19/2022 neg   Final    Ketones, UA 01/19/2022 neg   Final    Blood, UA POC 01/19/2022 neg   Final    pH, UA 01/19/2022 7   Final    Protein, UA POC 01/19/2022 neg   Final    Leukocytes, UA 01/19/2022 neg   Final    Nitrite, UA 01/19/2022 neg   Final       IMPRESSION/PLAN:  1. Hyperlipidemia LDL goal <70  2. Carotid bruit, unspecified laterality  -     Vascular duplex carotid bilateral; Future  3. History of multiple pulmonary nodules  4. Hypogonadism in male  5. History of malignant neoplasm of prostate     Patient peers to be doing well.  CT is reassuring.  The decision regarding his testosterone will be up to Dr. Frutoso Schatz although he appears to be clear of prostate cancer.  Patient has requested our office manage his testosterone as it would eliminate needing to see a specialist.    Follow up and Dispositions:  Return in about 2 months (around 06/29/2022) for [place] Dr. Frutoso Schatz.         Lowell Guitar, MD      This note was dictated using voice recognition software. While it was proofread, unrecognized voice recognition errors may still exist.

## 2022-05-10 ENCOUNTER — Ambulatory Visit (INDEPENDENT_AMBULATORY_CARE_PROVIDER_SITE_OTHER): Payer: Medicare Other | Admitting: *Deleted

## 2022-05-10 DIAGNOSIS — Z0000 Encounter for general adult medical examination without abnormal findings: Secondary | ICD-10-CM

## 2022-05-10 NOTE — Progress Notes (Signed)
Subjective:   Zachary Levy is a 76 y.o. male who presents for Medicare Annual/Subsequent preventive examination.  I connected with  Zachary Levy on 05/10/22 by a telephone enabled telemedicine application and verified that I am speaking with the correct person using two identifiers.   I discussed the limitations of evaluation and management by telemedicine. The patient expressed understanding and agreed to proceed.  Patient location: home  Provider location:  Tele-health-home    Review of Systems           Objective:    There were no vitals filed for this visit. There is no height or weight on file to calculate BMI.     02/08/2021    8:51 AM 08/13/2018    3:36 PM 07/07/2017   10:27 AM  Advanced Directives  Does Patient Have a Medical Advance Directive? Yes Yes Yes  Type of Advance Directive Living will [place];Living will [place];Living will  Does patient want to make changes to medical advance directive? No - Patient declined    Copy of [place] in Chart? No - copy requested No - copy requested No - copy requested    Current Medications (verified) Outpatient Encounter Medications as of 05/10/2022  Medication Sig   Emtricitab-Rilpivir-Tenofov AF (ODEFSEY PO) Take by mouth.   levothyroxine (SYNTHROID) 137 MCG tablet Take 1 tablet (137 mcg total) by mouth daily before breakfast.   mirabegron ER (MYRBETRIQ) 50 MG TB24 tablet Take 1 tablet (50 mg total) by mouth daily.   raltegravir (ISENTRESS) 400 MG tablet Take 400 mg by mouth 2 (two) times daily.   No facility-administered encounter medications on file as of 05/10/2022.    Allergies (verified) Patient has no known allergies.   History: Past Medical History:  Diagnosis Date   HIV infection ([place])    Hypothyroid    No past surgical history on file. Family History  Problem Relation Age of Onset   Cancer Sister    Cancer Brother    Social History    Socioeconomic History   Marital status: Divorced    Spouse name: Not on file   Number of children: Not on file   Years of education: Not on file   Highest education level: Not on file  Occupational History   Not on file  Tobacco Use   Smoking status: Never   Smokeless tobacco: Never  Vaping Use   Vaping Use: Never used  Substance and Sexual Activity   Alcohol use: Yes    Alcohol/week: 1.0 standard drink of alcohol    Types: 1 Cans of beer per week    Comment: 1-2 glass wine few times weekly   Drug use: No   Sexual activity: Not Currently    Partners: Male  Other Topics Concern   Not on file  Social History Narrative   Lives with his husband   Social Determinants of Health   Financial Resource Strain: Low Risk  (02/08/2021)   Overall Financial Resource Strain (CARDIA)    Difficulty of Paying Living Expenses: Not hard at all  Food Insecurity: No Food Insecurity (02/08/2021)   Hunger Vital Sign    Worried About Running Out of Food in the Last Year: Never true    Ran Out of Food in the Last Year: Never true  Transportation Needs: No Transportation Needs (02/08/2021)   PRAPARE - Hydrologist (Medical): No    Lack of Transportation (Non-Medical): No  Physical  Activity: Sufficiently Active (02/08/2021)   Exercise Vital Sign    Days of Exercise per Week: 5 days    Minutes of Exercise per Session: 30 min  Stress: No Stress Concern Present (02/08/2021)   [place]    Feeling of Stress : Not at all  Social Connections: [place] (02/08/2021)   Social Connection and Isolation Panel [NHANES]    Frequency of Communication with Friends and Family: More than three times a week    Frequency of Social Gatherings with Friends and Family: More than three times a week    Attends Religious Services: More than 4 times per year    Active Member of [employer] or Organizations: Yes    Attends  Music therapist: More than 4 times per year    Marital Status: Married    Tobacco Counseling Counseling given: Not Answered   Clinical Intake:                 Diabetic?  no         Activities of Daily Living     No data to display          Patient Care Team: Janith Lima, MD as PCP - General (Internal Medicine)  Indicate any recent Medical Services you may have received from other than [facility] providers in the past year (date may be approximate).     Assessment:   This is a routine wellness examination for Zachary Levy.  Hearing/Vision screen No results found.  Dietary issues and exercise activities discussed:     Goals Addressed   None    Depression Screen    03/07/2022   11:03 AM 02/08/2021    8:50 AM 11/03/2020    8:56 AM 09/02/2019   11:05 AM 08/13/2018    1:32 PM 07/07/2017   10:28 AM 05/26/2016    5:17 PM  PHQ 2/9 Scores  PHQ - 2 Score 0 0 0 0 0 0 0    Fall Risk    03/07/2022   11:02 AM 02/08/2021    8:51 AM 11/03/2020    8:56 AM 09/02/2019   11:05 AM 08/13/2018    1:32 PM  Fall Risk   Falls in the past year? 0 0 0 0 No  Number falls in past yr:  0  0   Injury with Fall?  0  0   Risk for fall due to :  No Fall Risks     Follow up  Falls evaluation completed  Falls evaluation completed     [place]:  Any stairs in or around the home? Yes  If so, are there any without handrails? No  Home free of loose throw rugs in walkways, pet beds, electrical cords, etc? Yes  Adequate lighting in your home to reduce risk of falls? Yes   ASSISTIVE DEVICES UTILIZED TO PREVENT FALLS:  Life alert? No  Use of a cane, walker or w/c? No  Grab bars in the bathroom? No  Shower chair or bench in shower? No  Elevated toilet seat or a handicapped toilet? Yes   TIMED UP AND GO:  Was the test performed? No .    Cognitive Function:        Immunizations Immunization History  Administered Date(s)  Administered   Hep A / Hep B 08/17/2007, 12/13/2007, 03/13/2008   Hepatitis B 11/21/1996, 12/22/1996, 01/19/1997   Influenza Whole 10/23/2006   Influenza, High  Dose Seasonal PF 09/05/2017, 08/13/2018, 08/20/2019   Influenza, Seasonal, Injecte, Preservative Fre 07/31/2008, 08/25/2011   Influenza,inj,Quad PF,6+ Mos 08/22/2013, 10/08/2015   Influenza-Unspecified 08/25/2014, 09/05/2016, 08/04/2021   PFIZER(Purple Top)SARS-COV-2 Vaccination 12/12/2019, 01/02/2020, 08/16/2020   Pneumococcal Conjugate-13 03/13/2014   Pneumococcal Polysaccharide-23 12/23/1998, 10/08/2015, 12/13/2021   Pneumococcal-Unspecified 08/16/2007   Td 05/18/2017   Tdap 05/18/2017   Zoster Recombinat (Shingrix) 05/18/2017, 12/19/2017    TDAP status: Up to date  Flu Vaccine status: Up to date  Pneumococcal vaccine status: Up to date  Covid-19 vaccine status: Information provided on how to obtain vaccines.   Qualifies for Shingles Vaccine? No   Zostavax completed No   Shingrix Completed?: Yes  Screening Tests Health Maintenance  Topic Date Due   COVID-19 Vaccine (4 - Booster for Pfizer series) 10/11/2020   INFLUENZA VACCINE  06/21/2022   COLONOSCOPY (Pts 45-50yr Insurance coverage will need to be confirmed)  12/16/2024   TETANUS/TDAP  05/19/2027   Pneumonia Vaccine 76 Years old  Completed   Hepatitis C Screening  Completed   Zoster Vaccines- Shingrix  Completed   HPV VACCINES  Aged Out    Health Maintenance  Health Maintenance Due  Topic Date Due   COVID-19 Vaccine (4 - Booster for [employer]owningseries) 10/11/2020    Colorectal cancer screening: Type of screening: Colonoscopy. Completed 2023. Repeat every 10 years  Lung Cancer Screening: (Low Dose CT Chest recommended if Age 74-80years, 30 pack-year currently smoking OR have quit w/in 15years.) does not qualify.   Lung Cancer Screening Referral:   Additional Screening:  Hepatitis C Screening: does not qualify; Completed 2022  Vision Screening:  Recommended annual ophthalmology exams for early detection of glaucoma and other disorders of the eye. Is the patient up to date with their annual eye exam?  Yes  Who is the provider or what is the name of the office in which the patient attends annual eye exams? [facility]leIf pt is not established with a provider, would they like to be referred to a provider to establish care? No .   Dental Screening: Recommended annual dental exams for proper oral hygiene  Community Resource Referral / Chronic Care Management: CRR required this visit?  No   CCM required this visit?  No      Plan:     I have personally reviewed and noted the following in the patient's chart:   Medical and social history Use of alcohol, tobacco or illicit drugs  Current medications and supplements including opioid prescriptions. Patient is not currently taking opioid prescriptions. Functional ability and status Nutritional status Physical activity Advanced directives List of other physicians Hospitalizations, surgeries, and ER visits in previous 12 months Vitals Screenings to include cognitive, depression, and falls Referrals and appointments  In addition, I have reviewed and discussed with patient certain preventive protocols, quality metrics, and best practice recommendations. A written personalized care plan for preventive services as well as general preventive health recommendations were provided to patient.     JLeroy Kennedy LPN   [id number]  Nurse Notes:

## 2022-05-10 NOTE — Patient Instructions (Signed)
Zachary Levy , Thank you for taking time to come for your Medicare Wellness Visit. I appreciate your ongoing commitment to your health goals. Please review the following plan we discussed and let me know if I can assist you in the future.   Screening recommendations/referrals: Colonoscopy: up to date Recommended yearly ophthalmology/optometry visit for glaucoma screening and checkup Recommended yearly dental visit for hygiene and checkup  Vaccinations: Influenza vaccine: up to date Pneumococcal vaccine: up to date Tdap vaccine: up to date Shingles vaccine: up to date    Advanced directives: on file  Conditions/risks identified:   Next appointment: 09-19-2022 @ 11:00  [place] 65 Years and Older, Male Preventive care refers to lifestyle choices and visits with your health care provider that can promote health and wellness. What does preventive care include? A yearly physical exam. This is also called an annual well check. Dental exams once or twice a year. Routine eye exams. Ask your health care provider how often you should have your eyes checked. Personal lifestyle choices, including: Daily care of your teeth and gums. Regular physical activity. Eating a healthy diet. Avoiding tobacco and drug use. Limiting alcohol use. Practicing safe sex. Taking low doses of aspirin every day. Taking vitamin and mineral supplements as recommended by your health care provider. What happens during an annual well check? The services and screenings done by your health care provider during your annual well check will depend on your age, overall health, lifestyle risk factors, and family history of disease. Counseling  Your health care provider may ask you questions about your: Alcohol use. Tobacco use. Drug use. Emotional well-being. Home and relationship well-being. Sexual activity. Eating habits. History of falls. Memory and ability to understand (cognition). Work and work  Statistician. Screening  You may have the following tests or measurements: Height, weight, and BMI. Blood pressure. Lipid and cholesterol levels. These may be checked every 5 years, or more frequently if you are over 47 years old. Skin check. Lung cancer screening. You may have this screening every year starting at age 57 if you have a 30-pack-year history of smoking and currently smoke or have quit within the past 15 years. Fecal occult blood test (FOBT) of the stool. You may have this test every year starting at age 95. Flexible sigmoidoscopy or colonoscopy. You may have a sigmoidoscopy every 5 years or a colonoscopy every 10 years starting at age 55. Prostate cancer screening. Recommendations will vary depending on your family history and other risks. Hepatitis C blood test. Hepatitis B blood test. Sexually transmitted disease (STD) testing. Diabetes screening. This is done by checking your blood sugar (glucose) after you have not eaten for a while (fasting). You may have this done every 1-3 years. Abdominal aortic aneurysm (AAA) screening. You may need this if you are a current or former smoker. Osteoporosis. You may be screened starting at age 81 if you are at high risk. Talk with your health care provider about your test results, treatment options, and if necessary, the need for more tests. Vaccines  Your health care provider may recommend certain vaccines, such as: Influenza vaccine. This is recommended every year. Tetanus, diphtheria, and acellular pertussis (Tdap, Td) vaccine. You may need a Td booster every 10 years. Zoster vaccine. You may need this after age 76. Pneumococcal 13-valent conjugate (PCV13) vaccine. One dose is recommended after age 55. Pneumococcal polysaccharide (PPSV23) vaccine. One dose is recommended after age 62. Talk to your health care provider about which screenings  and vaccines you need and how often you need them. This information is not intended to replace  advice given to you by your health care provider. Make sure you discuss any questions you have with your health care provider. Document Released: 12/04/2015 Document Revised: 07/27/2016 Document Reviewed: 09/08/2015 Elsevier Interactive Patient Education  2017 [employer] Prevention in the Home Falls can cause injuries. They can happen to people of all ages. There are many things you can do to make your home safe and to help prevent falls. What can I do on the outside of my home? Regularly fix the edges of walkways and driveways and fix any cracks. Remove anything that might make you trip as you walk through a door, such as a raised step or threshold. Trim any bushes or trees on the path to your home. Use bright outdoor lighting. Clear any walking paths of anything that might make someone trip, such as rocks or tools. Regularly check to see if handrails are loose or broken. Make sure that both sides of any steps have handrails. Any raised decks and porches should have guardrails on the edges. Have any leaves, snow, or ice cleared regularly. Use sand or salt on walking paths during winter. Clean up any spills in your garage right away. This includes oil or grease spills. What can I do in the bathroom? Use night lights. Install grab bars by the toilet and in the tub and shower. Do not use towel bars as grab bars. Use non-skid mats or decals in the tub or shower. If you need to sit down in the shower, use a plastic, non-slip stool. Keep the floor dry. Clean up any water that spills on the floor as soon as it happens. Remove soap buildup in the tub or shower regularly. Attach bath mats securely with double-sided non-slip rug tape. Do not have throw rugs and other things on the floor that can make you trip. What can I do in the bedroom? Use night lights. Make sure that you have a light by your bed that is easy to reach. Do not use any sheets or blankets that are too big for your bed.  They should not hang down onto the floor. Have a firm chair that has side arms. You can use this for support while you get dressed. Do not have throw rugs and other things on the floor that can make you trip. What can I do in the kitchen? Clean up any spills right away. Avoid walking on wet floors. Keep items that you use a lot in easy-to-reach places. If you need to reach something above you, use a strong step stool that has a grab bar. Keep electrical cords out of the way. Do not use floor polish or wax that makes floors slippery. If you must use wax, use non-skid floor wax. Do not have throw rugs and other things on the floor that can make you trip. What can I do with my stairs? Do not leave any items on the stairs. Make sure that there are handrails on both sides of the stairs and use them. Fix handrails that are broken or loose. Make sure that handrails are as long as the stairways. Check any carpeting to make sure that it is firmly attached to the stairs. Fix any carpet that is loose or worn. Avoid having throw rugs at the top or bottom of the stairs. If you do have throw rugs, attach them to the floor with carpet tape.  Make sure that you have a light switch at the top of the stairs and the bottom of the stairs. If you do not have them, ask someone to add them for you. What else can I do to help prevent falls? Wear shoes that: Do not have high heels. Have rubber bottoms. Are comfortable and fit you well. Are closed at the toe. Do not wear sandals. If you use a stepladder: Make sure that it is fully opened. Do not climb a closed stepladder. Make sure that both sides of the stepladder are locked into place. Ask someone to hold it for you, if possible. Clearly mark and make sure that you can see: Any grab bars or handrails. First and last steps. Where the edge of each step is. Use tools that help you move around (mobility aids) if they are needed. These  include: Canes. Walkers. Scooters. Crutches. Turn on the lights when you go into a dark area. Replace any light bulbs as soon as they burn out. Set up your furniture so you have a clear path. Avoid moving your furniture around. If any of your floors are uneven, fix them. If there are any pets around you, be aware of where they are. Review your medicines with your doctor. Some medicines can make you feel dizzy. This can increase your chance of falling. Ask your doctor what other things that you can do to help prevent falls. This information is not intended to replace advice given to you by your health care provider. Make sure you discuss any questions you have with your health care provider. Document Released: 09/03/2009 Document Revised: 04/14/2016 Document Reviewed: 12/12/2014 Elsevier Interactive Patient Education  2017 [employer].

## 2022-05-12 DIAGNOSIS — R29898 Other symptoms and signs involving the musculoskeletal system: Secondary | ICD-10-CM | POA: Diagnosis not present

## 2022-05-12 DIAGNOSIS — E039 Hypothyroidism, unspecified: Secondary | ICD-10-CM | POA: Diagnosis not present

## 2022-05-12 DIAGNOSIS — N529 Male erectile dysfunction, unspecified: Secondary | ICD-10-CM | POA: Diagnosis not present

## 2022-05-12 DIAGNOSIS — E291 Testicular hypofunction: Secondary | ICD-10-CM | POA: Diagnosis not present

## 2022-05-12 DIAGNOSIS — Z6826 Body mass index (BMI) 26.0-26.9, adult: Secondary | ICD-10-CM | POA: Diagnosis not present

## 2022-05-12 DIAGNOSIS — B20 Human immunodeficiency virus [HIV] disease: Secondary | ICD-10-CM | POA: Diagnosis not present

## 2022-05-13 ENCOUNTER — Ambulatory Visit: Payer: MEDICARE | Primary: Family Medicine

## 2022-05-13 DIAGNOSIS — R0989 Other specified symptoms and signs involving the circulatory and respiratory systems: Secondary | ICD-10-CM

## 2022-05-14 LAB — VAS DUP CAROTID BILATERAL
Left CCA dist EDV: 15.4 cm/s
Left CCA dist PSV: 64.2 cm/s
Left CCA prox EDV: 18.9 cm/s
Left CCA prox PSV: 92.7 cm/s
Left ECA EDV: 5.34 cm/s
Left ECA PSV: 33 cm/s
Left ICA dist EDV: 19.4 cm/s
Left ICA dist PSV: 54.3 cm/s
Left ICA prox EDV: 15.4 cm/s
Left ICA prox PSV: 44.1 cm/s
Left ICA/CCA PSV: 0.85 no-unit
Left subclavian prox EDV: 0 cm/s
Left subclavian prox PSV: 86.4 cm/s
Left vertebral EDV: 6.39 cm/s
Left vertebral PSV: 27.8 cm/s
Right CCA dist EDV: 12.7 cm/s
Right CCA prox EDV: 16.1 cm/s
Right CCA prox PSV: 83.3 cm/s
Right ECA EDV: 10.12 cm/s
Right ECA PSV: 59.8 cm/s
Right ICA dist EDV: 17.1 cm/s
Right ICA dist PSV: 45 cm/s
Right ICA prox EDV: 13.6 cm/s
Right ICA prox PSV: 50.2 cm/s
Right ICA/CCA PSV: 1 no-unit
Right cca dist PSV: 50.2 cm/s
Right subclavian prox EDV: 0 cm/s
Right subclavian prox PSV: 108.8 cm/s
Right vertebral EDV: 15.35 cm/s
Right vertebral PSV: 44.1 cm/s

## 2022-05-26 ENCOUNTER — Ambulatory Visit (INDEPENDENT_AMBULATORY_CARE_PROVIDER_SITE_OTHER): Payer: Medicare Other | Admitting: Family Medicine

## 2022-05-26 ENCOUNTER — Encounter: Payer: Self-pay | Admitting: Family Medicine

## 2022-05-26 VITALS — BP 124/80 | HR 55 | Temp 97.6°F | Ht 74.0 in | Wt 203.0 lb

## 2022-05-26 DIAGNOSIS — Z86010 Personal history of colonic polyps: Secondary | ICD-10-CM | POA: Insufficient documentation

## 2022-05-26 DIAGNOSIS — R238 Other skin changes: Secondary | ICD-10-CM | POA: Insufficient documentation

## 2022-05-26 DIAGNOSIS — B356 Tinea cruris: Secondary | ICD-10-CM | POA: Diagnosis not present

## 2022-05-26 DIAGNOSIS — S99921A Unspecified injury of right foot, initial encounter: Secondary | ICD-10-CM | POA: Insufficient documentation

## 2022-05-26 MED ORDER — KETOCONAZOLE 2 % EX SHAM: 1.0000 | MEDICATED_SHAMPOO | CUTANEOUS | 0 refills | Status: DC

## 2022-05-26 MED ORDER — TERBINAFINE HCL 1 % EX CREA: 1.0000 | TOPICAL_CREAM | Freq: Two times a day (BID) | CUTANEOUS | 0 refills | Status: DC

## 2022-05-26 NOTE — Progress Notes (Signed)
Subjective:     Patient ID: Zachary Levy, male    DOB: 12-23-1945, 76 y.o.   MRN: [id number]   HPI Patient is in today for multiple issues.   Scalp and pubic area has been itching. No pruritis today. States he used Rid Lice treatment 3 x over the past 4 weeks. He also notes some itching in both groin areas.   Denies being sexually active. Recently had a massage and questions if he may have been exposed to something on the massage table.  Denies fever, chills, URI symptoms, chest pain, palpitations, abdominal pain, N/V/D, urinary symptoms, penile discharge, lesions or pain.   Toe injury on right foot yesterday but improving today. Minimal pain.     There are no preventive care reminders to display for this patient.  Past Medical History:  Diagnosis Date   HIV infection ([place])    Hypothyroid     History reviewed. No pertinent surgical history.  Family History  Problem Relation Age of Onset   Cancer Sister    Cancer Brother     Social History   Socioeconomic History   Marital status: Divorced    Spouse name: Not on file   Number of children: Not on file   Years of education: Not on file   Highest education level: Not on file  Occupational History   Not on file  Tobacco Use   Smoking status: Never   Smokeless tobacco: Never  Vaping Use   Vaping Use: Never used  Substance and Sexual Activity   Alcohol use: Yes    Alcohol/week: 1.0 standard drink of alcohol    Types: 1 Cans of beer per week    Comment: 1-2 glass wine few times weekly   Drug use: No   Sexual activity: Not Currently    Partners: Male  Other Topics Concern   Not on file  Social History Narrative   Lives with his husband   Social Determinants of Health   Financial Resource Strain: Low Risk  (05/10/2022)   Overall Financial Resource Strain (CARDIA)    Difficulty of Paying Living Expenses: Not hard at all  Food Insecurity: No Food Insecurity (05/10/2022)   Hunger Vital Sign    Worried About  Running Out of Food in the Last Year: Never true    [place] in the Last Year: Never true  Transportation Needs: No Transportation Needs (05/10/2022)   PRAPARE - Hydrologist (Medical): No    Lack of Transportation (Non-Medical): No  Physical Activity: Sufficiently Active (05/10/2022)   Exercise Vital Sign    Days of Exercise per Week: 5 days    Minutes of Exercise per Session: 60 min  Stress: No Stress Concern Present (05/10/2022)   Joy    Feeling of Stress : Not at all  Social Connections: Moderately Isolated (05/10/2022)   Social Connection and Isolation Panel [NHANES]    Frequency of Communication with Friends and Family: More than three times a week    Frequency of Social Gatherings with Friends and Family: Three times a week    Attends Religious Services: Never    Active Member of Clubs or Organizations: Yes    Attends Archivist Meetings: More than 4 times per year    Marital Status: Divorced  Intimate Partner Violence: Not At Risk (05/10/2022)   Humiliation, Afraid, Rape, and Kick questionnaire    Fear of Current or Ex-Partner:  No    Emotionally Abused: No    Physically Abused: No    Sexually Abused: No    Outpatient Medications Prior to Visit  Medication Sig Dispense Refill   Emtricitab-Rilpivir-Tenofov AF (ODEFSEY PO) Take by mouth 1 day or 1 dose. One at bedtime     levothyroxine (SYNTHROID) 137 MCG tablet Take 1 tablet (137 mcg total) by mouth daily before breakfast. 90 tablet 1   raltegravir (ISENTRESS) 400 MG tablet Take 400 mg by mouth 2 (two) times daily.     bictegravir-emtricitabine-tenofovir AF (BIKTARVY) 50-200-25 MG TABS tablet Take 1 tablet by mouth daily. (Patient not taking: Reported on 05/26/2022)     mirabegron ER (MYRBETRIQ) 50 MG TB24 tablet Take 1 tablet (50 mg total) by mouth daily. (Patient not taking: Reported on 05/26/2022) 90 tablet 0   No  facility-administered medications prior to visit.    No Known Allergies  ROS Pertinent positives and negatives in the history of present illness.     Objective:    Physical Exam Exam conducted with a chaperone present.  Constitutional:      General: He is not in acute distress.    Appearance: Normal appearance. He is not ill-appearing.  HENT:     Head: Normocephalic and atraumatic.     Comments: Scalp without erythema, dryness, flaking or any sign of rash     Mouth/Throat:     Mouth: Mucous membranes are moist.  Eyes:     Conjunctiva/sclera: Conjunctivae normal.  Cardiovascular:     Rate and Rhythm: Normal rate.  Pulmonary:     Effort: Pulmonary effort is normal.  Genitourinary:    Pubic Area: No rash or pubic lice.      Penis: Normal.      Comments: Mild erythema noted to pubic area and in bilateral creases of groin without maceration or sign of infection.  Musculoskeletal:     Right foot: Normal capillary refill. No swelling. Normal pulse.     Left foot: Normal.     Comments: bruising noted to distal dorsal aspect of 4th toe on right foot with normal appearing nail bed. Normal sensation, cap refill,  ROM of toe.   Lymphadenopathy:     Lower Body: No right inguinal adenopathy. No left inguinal adenopathy.  Neurological:     Mental Status: He is alert and oriented to person, place, and time.     Sensory: Sensation is intact.     Motor: Motor function is intact.     Coordination: Coordination is intact.     Gait: Gait is intact.  Psychiatric:        Mood and Affect: Mood normal.        Behavior: Behavior normal.     BP 124/80 (BP Location: Left Arm, Patient Position: Sitting, Cuff Size: Normal)   Pulse (!) 55   Temp 97.6 F (36.4 C) (Temporal)   Ht '6\' 2"'$  (1.88 m)   Wt 203 lb (92.1 kg)   SpO2 99%   BMI 26.06 kg/m  Wt Readings from Last 3 Encounters:  05/26/22 203 lb (92.1 kg)  03/07/22 206 lb (93.4 kg)  12/13/21 208 lb (94.3 kg)       Assessment & Plan:    Problem List Items Addressed This Visit       Musculoskeletal and Integument   Dry scalp    Recommend sunscreen on scalp and may use ketoconazole shampoo as prescribed. Follow up if not improving.       Relevant Medications  ketoconazole (NIZORAL) 2 % shampoo   Tinea cruris - Primary    Lamasil AT prescribed. Discussed wearing cotton underwear and keeping the area dry for the next 2-4 weeks. Follow up if worsening or not improving in 2-4 weeks.       Relevant Medications   bictegravir-emtricitabine-tenofovir AF (BIKTARVY) 50-200-25 MG TABS tablet   terbinafine (LAMISIL AT) 1 % cream   ketoconazole (NIZORAL) 2 % shampoo     Other   Toe injury, right, initial encounter    No obvious fracture or dislocation. Nail bed intact and non traumatic appearing. Pain is improving today. Discussed buddy tape, ice, elevation and Tylenol or ibuprofen for pain.        I have discontinued Issacc Carneiro's mirabegron ER. I am also having him start on terbinafine and ketoconazole. Additionally, I am having him maintain his raltegravir, Emtricitab-Rilpivir-Tenofov AF (ODEFSEY PO), levothyroxine, and bictegravir-emtricitabine-tenofovir AF.  Meds ordered this encounter  Medications   terbinafine (LAMISIL AT) 1 % cream    Sig: Apply 1 Application topically 2 (two) times daily.    Dispense:  30 g    Refill:  0    Order Specific Question:   Supervising Provider    Answer:   Pricilla Holm A [[id number]]   ketoconazole (NIZORAL) 2 % shampoo    Sig: Apply 1 Application topically 2 (two) times a week.    Dispense:  120 mL    Refill:  0    Order Specific Question:   Supervising Provider    Answer:   Pricilla Holm A [[id number]]

## 2022-05-26 NOTE — Assessment & Plan Note (Signed)
Lamasil AT prescribed. Discussed wearing cotton underwear and keeping the area dry for the next 2-4 weeks. Follow up if worsening or not improving in 2-4 weeks.

## 2022-05-26 NOTE — Patient Instructions (Signed)
Use the Nizoral shampoo that I prescribed for dry and itchy scalp.  Make sure you prevent sunburn on your scalp.  Use Lamisil in the groin area twice daily for the next 2 to 4 weeks.  Follow-up if you are getting worse or no improvement.  For your toe injury, you can buddy tape, use ice, elevation and Tylenol or ibuprofen for pain if needed.

## 2022-05-26 NOTE — Assessment & Plan Note (Signed)
No obvious fracture or dislocation. Nail bed intact and non traumatic appearing. Pain is improving today. Discussed buddy tape, ice, elevation and Tylenol or ibuprofen for pain.

## 2022-05-26 NOTE — Assessment & Plan Note (Signed)
Recommend sunscreen on scalp and may use ketoconazole shampoo as prescribed. Follow up if not improving.

## 2022-06-09 DIAGNOSIS — N5201 Erectile dysfunction due to arterial insufficiency: Secondary | ICD-10-CM | POA: Diagnosis not present

## 2022-06-09 DIAGNOSIS — R351 Nocturia: Secondary | ICD-10-CM | POA: Diagnosis not present

## 2022-06-29 ENCOUNTER — Ambulatory Visit: Admit: 2022-06-29 | Discharge: 2022-06-29 | Payer: MEDICARE | Attending: Family Medicine | Primary: Family Medicine

## 2022-06-29 DIAGNOSIS — Z0000 Encounter for general adult medical examination without abnormal findings: Secondary | ICD-10-CM

## 2022-06-29 MED ORDER — ZOLPIDEM TARTRATE 5 MG PO TABS
5 MG | ORAL_TABLET | Freq: Every evening | ORAL | 0 refills | Status: AC | PRN
Start: 2022-06-29 — End: 2022-07-13

## 2022-06-29 NOTE — Progress Notes (Signed)
[facility] MD  [address] [place] [address]  Phone:  [phone]  Fax:  [phone]      Patient: Oscar Turner    MRN: [id number]    Age: 76 y.o.    DOB: December 24, 1945      ASSESSMENT & PLAN    Brief history and plan:   1. Medicare annual wellness visit, subsequent  2. Cigarette nicotine dependence in remission  -     Vascular AAA screening; Future  3. Encounter for screening for cardiovascular disorders  -     Vascular AAA screening; Future  4. Encounter to establish care with new doctor  -     Comprehensive Metabolic Panel; Future  -     Lipid Panel; Future  -     CBC with Auto Differential; Future  -     TSH with Reflex; Future  5. Insomnia, unspecified type  Overview:  Terrible sleep with traveling. Regimen: prn ambien '5mg'$ .  Assessment & Plan:  He will go on cruise for 12 days and has terrible sleep with traveling. He would like some ambien.   -short course of ambien  Orders:  -     zolpidem (AMBIEN) 5 MG tablet; Take 1 tablet by mouth nightly as needed for Sleep for up to 14 days. Max Daily Amount: 5 mg, Disp-14 tablet, R-0Normal  6. Mixed hyperlipidemia  Overview:  Hx of carotid stenosis. Regimen: rosuvastatin '20mg'$  and omega-3 FA '500mg'$  BID.  Assessment & Plan:   -continue regimen  -lipid, tsh, cmp  Orders:  -     Comprehensive Metabolic Panel; Future  -     Lipid Panel; Future  -     TSH with Reflex; Future  7. Polycythemia  Overview:  Pt on testosterone therapy follow by urology. Hg 17.5  On 05/2020. Hg wnl 11/10/21.   Orders:  -     CBC with Auto Differential; Future          Follow Up:  Return in about 6 months (around 12/30/2022) for follow up.    SUBJECTIVE      History of Present Illness:     76 y.o. male  presents to the clinic for   Chief Complaint   Patient presents with    Medicare AWV   .     He previously worked in Advertising account executive. Hx of low testosterone. On testosterone injections. Followed by urology (Dr. Cindi Carbon).Hx of prostate cancer. S/p  resection of prostate. Followed by urology (Dr. Cindi Carbon). Hx of hld. Regimen: rosuvastatin '20mg'$  and omega-3 FA '500mg'$  BID. He will go on cruise for 12 days and has terrible sleep with traveling. He would like some ambien.     PHQ-9 Total Score: 0 (06/29/2022 10:38 AM)          Medical History    The following portions of the patient's history were reviewed and updated as appropriate: allergies, current medications, past family history, past medical history, past social history, past surgical history, problem list, pertinent lab work and pertinent imaging.  No Known Allergies  Patient Active Problem List   Diagnosis    Stress incontinence, male    Erectile dysfunction following radical prostatectomy    Mixed hyperlipidemia    Polycythemia    Insomnia    History of malignant neoplasm of prostate    History of multiple pulmonary nodules    Overweight (BMI 25.0-29.9)    Bilateral carotid artery stenosis  Low testosterone        Current Medications      Current Outpatient Medications:     zolpidem (AMBIEN) 5 MG tablet, Take 1 tablet by mouth nightly as needed for Sleep for up to 14 days. Max Daily Amount: 5 mg, Disp: 14 tablet, Rfl: 0    Coenzyme Q10 10 MG CAPS, Take by mouth, Disp: , Rfl:     testosterone cypionate (DEPOTESTOTERONE CYPIONATE) 200 MG/ML injection, Inject 1 mL into the muscle every 14 days for 90 days., Disp: 2 mL, Rfl: 2    rosuvastatin (CRESTOR) 20 MG tablet, Take 1 tablet by mouth daily, Disp: 90 tablet, Rfl: 1    Multiple Vitamins-Minerals (MULTI FOR HIM) TABS, Take by mouth, Disp: , Rfl:     Garlic 505 MG CAPS, Take by mouth, Disp: , Rfl:     Omega-3 Fatty Acids (FISH OIL) 500 MG CAPS, Take 500 mg by mouth 2 times daily, Disp: , Rfl:     turmeric 500 MG CAPS, Take by mouth As directed, Disp: , Rfl:       OBJECTIVE:    Vital Signs   Vitals:    06/29/22 1043   BP: 126/76   Pulse: (!) 48   Resp: 16   SpO2: 95%   Weight: 167 lb 0.7 oz (75.8 kg)       Body mass index is 25.4 kg/m.    Physical  Exam    Physical Exam  Vitals reviewed.   Constitutional:       General: He is not in acute distress.     Appearance: Normal appearance. He is not ill-appearing or toxic-appearing.   Cardiovascular:      Rate and Rhythm: Normal rate and regular rhythm.      Heart sounds: Normal heart sounds. No murmur heard.    No friction rub. No gallop.   Pulmonary:      Effort: Pulmonary effort is normal. No respiratory distress.      Breath sounds: Normal breath sounds. No stridor. No wheezing, rhonchi or rales.   Neurological:      Mental Status: He is alert.        Yvone Neu MD

## 2022-06-29 NOTE — Assessment & Plan Note (Signed)
He will go on cruise for 12 days and has terrible sleep with traveling. He would like some [place].   -short course of [place]

## 2022-06-29 NOTE — Progress Notes (Signed)
Medicare Annual Wellness Visit    Oscar Turner is here for Medicare AWV    Assessment & Plan   Medicare annual wellness visit, subsequent  Cigarette nicotine dependence in remission  -     Vascular AAA screening; Future  Encounter for screening for cardiovascular disorders  -     Vascular AAA screening; Future  Encounter to establish care with new doctor  -     Comprehensive Metabolic Panel; Future  -     Lipid Panel; Future  -     CBC with Auto Differential; Future  -     TSH with Reflex; Future  Insomnia, unspecified type  Assessment & Plan:  He will go on cruise for 12 days and has terrible sleep with traveling. He would like some ambien.   -short course of ambien  Orders:  -     zolpidem (AMBIEN) 5 MG tablet; Take 1 tablet by mouth nightly as needed for Sleep for up to 14 days. Max Daily Amount: 5 mg, Disp-14 tablet, R-0Normal  Mixed hyperlipidemia  Assessment & Plan:   -continue regimen  -lipid, tsh, cmp  Orders:  -     Comprehensive Metabolic Panel; Future  -     Lipid Panel; Future  -     TSH with Reflex; Future  Polycythemia  -     CBC with Auto Differential; Future  Recommendations for Preventive Services Due: see orders and patient instructions/AVS.  Recommended screening schedule for the next 5-10 years is provided to the patient in written form: see Patient Instructions/AVS.     Return in about 6 months (around 12/30/2022) for follow up.     Subjective         Patient's complete Health Risk Assessment and screening values have been reviewed and are found in Flowsheets. The following problems were reviewed today and where indicated follow up appointments were made and/or referrals ordered.    Pt states he has had shingles vaccine. Recommended getting records.     He previously worked in [employer] account executive. Hx of low testosterone. On testosterone injections. Followed by urology (Dr. Cindi Carbon).Hx of prostate cancer. S/p resection of prostate. Followed by urology (Dr. Cindi Carbon). Hx of hld. Regimen: rosuvastatin  '20mg'$  and omega-3 FA '500mg'$  BID.        Positive Risk Factor Screenings with Interventions:                     Safety:  Do you have either shower bars, grab bars, non-slip mats or non-slip surfaces in your shower or bathtub?: (!) No  Interventions:  Home safety tips provided                     Objective   Vitals:    06/29/22 1043   BP: 126/76   Pulse: (!) 48   Resp: 16   SpO2: 95%   Weight: 167 lb 0.7 oz (75.8 kg)      Body mass index is 25.4 kg/m.               No Known Allergies  Prior to Visit Medications    Medication Sig Taking? Authorizing Provider   zolpidem (AMBIEN) 5 MG tablet Take 1 tablet by mouth nightly as needed for Sleep for up to 14 days. Max Daily Amount: 5 mg Yes [name], MD   Coenzyme Q10 10 MG CAPS Take by mouth  Historical Provider, MD   testosterone cypionate (DEPOTESTOTERONE CYPIONATE) 200 MG/ML  injection Inject 1 mL into the muscle every 14 days for 90 days.  Brantley Fling, PA   rosuvastatin (CRESTOR) 20 MG tablet Take 1 tablet by mouth daily  Brantley Fling, PA   Multiple Vitamins-Minerals (MULTI FOR HIM) TABS Take by mouth  Historical Provider, MD   Garlic 811 MG CAPS Take by mouth  Historical Provider, MD   Omega-3 Fatty Acids (FISH OIL) 500 MG CAPS Take 500 mg by mouth 2 times daily  Historical Provider, MD   turmeric 500 MG CAPS Take by mouth As directed  Historical Provider, MD       CareTeam (Including outside providers/suppliers regularly involved in providing care):   Patient Care Team:  [name], MD as PCP - General (Family Medicine)  Lowell Guitar, MD as PCP - Empaneled Provider     Reviewed and updated this visit:  Tobacco  Allergies  Meds  Problems  Med Hx  Surg Hx  Soc Hx  Fam Hx

## 2022-06-29 NOTE — Patient Instructions (Signed)
A Healthy Heart: Care Instructions  Your Care Instructions     Coronary artery disease, also called heart disease, occurs when a substance called plaque builds up in the vessels that supply oxygen-rich blood to your heart muscle. This can narrow the blood vessels and reduce blood flow. A heart attack happens when blood flow is completely blocked. A high-fat diet, smoking, and other factors increase the risk of heart disease.  Your doctor has found that you have a chance of having heart disease. You can do lots of things to keep your heart healthy. It may not be easy, but you can change your diet, exercise more, and quit smoking. These steps really work to lower your chance of heart disease.  Follow-up care is a key part of your treatment and safety. Be sure to make and go to all appointments, and call your doctor if you are having problems. It's also a good idea to know your test results and keep a list of the medicines you take.  How can you care for yourself at home?  Diet   Use less salt when you cook and eat. This helps lower your blood pressure. Taste food before salting. Add only a little salt when you think you need it. With time, your taste buds will adjust to less salt.    Eat fewer snack items, fast foods, canned soups, and other high-salt, high-fat, processed foods.    Read food labels and try to avoid saturated and trans fats. They increase your risk of heart disease by raising cholesterol levels.    Limit the amount of solid fat-butter, margarine, and shortening-you eat. Use olive, peanut, or canola oil when you cook. Bake, broil, and steam foods instead of frying them.    Eat a variety of fruit and vegetables every day. Dark green, deep orange, red, or yellow fruits and vegetables are especially good for you. Examples include spinach, carrots, peaches, and berries.    Foods high in fiber can reduce your cholesterol and provide important vitamins and minerals. High-fiber foods include  whole-grain cereals and breads, oatmeal, beans, brown rice, citrus fruits, and apples.    Eat lean proteins. Heart-healthy proteins include seafood, lean meats and poultry, eggs, beans, peas, nuts, seeds, and soy products.    Limit drinks and foods with added sugar. These include candy, desserts, and soda pop.   Lifestyle changes   If your doctor recommends it, get more exercise. Walking is a good choice. Bit by bit, increase the amount you walk every day. Try for at least 30 minutes on most days of the week. You also may want to swim, bike, or do other activities.    Do not smoke. If you need help quitting, talk to your doctor about stop-smoking programs and medicines. These can increase your chances of quitting for good. Quitting smoking may be the most important step you can take to protect your heart. It is never too late to quit.    Limit alcohol to 2 drinks a day for men and 1 drink a day for women. Too much alcohol can cause health problems.    Manage other health problems such as diabetes, high blood pressure, and high cholesterol. If you think you may have a problem with alcohol or drug use, talk to your doctor.   Medicines   Take your medicines exactly as prescribed. Call your doctor if you think you are having a problem with your medicine.    If your doctor recommends aspirin, take  the amount directed each day. Make sure you take aspirin and not another kind of pain reliever, such as acetaminophen (Tylenol).   When should you call for help?   Call 911 if you have symptoms of a heart attack. These may include:   Chest pain or pressure, or a strange feeling in the chest.    Sweating.    Shortness of breath.    Pain, pressure, or a strange feeling in the back, neck, jaw, or upper belly or in one or both shoulders or arms.    Lightheadedness or sudden weakness.    A fast or irregular heartbeat.   After you call 911, the operator may tell you to chew 1 adult-strength or 2 to 4 low-dose aspirin.  Wait for an ambulance. Do not try to drive yourself.  Watch closely for changes in your health, and be sure to contact your doctor if you have any problems.  Where can you learn more?  Go to https://www.bennett.info/ and enter F075 to learn more about "A Healthy Heart: Care Instructions."  Current as of: September 7, 2022Content Version: 13.7   2006-2023 [employer].   Care instructions adapted under license by [facility]. If you have questions about a medical condition or this instruction, always ask your healthcare professional. Orient any warranty or liability for your use of this information.      Personalized Preventive Plan for LENNIS KORB - 06/29/2022  Medicare offers a range of preventive health benefits. Some of the tests and screenings are paid in full while other may be subject to a deductible, co-insurance, and/or copay.    Some of these benefits include a comprehensive review of your medical history including lifestyle, illnesses that may run in your family, and various assessments and screenings as appropriate.    After reviewing your medical record and screening and assessments performed today your provider may have ordered immunizations, labs, imaging, and/or referrals for you.  A list of these orders (if applicable) as well as your Preventive Care list are included within your After Visit Summary for your review.    Other Preventive Recommendations:    A preventive eye exam performed by an eye specialist is recommended every 1-2 years to screen for glaucoma; cataracts, macular degeneration, and other eye disorders.  A preventive dental visit is recommended every 6 months.  Try to get at least 150 minutes of exercise per week or 10,000 steps per day on a pedometer .  Order or download the FREE "Exercise & Physical Activity: Your Everyday Guide" from [employer]. Call 815-719-1847 or search [employer] online.  You need 1200-1500 mg of calcium and 1000-2000 IU of vitamin D per day. It is possible to meet your calcium requirement with diet alone, but a vitamin D supplement is usually necessary to meet this goal.  When exposed to the sun, use a sunscreen that protects against both UVA and UVB radiation with an SPF of 30 or greater. Reapply every 2 to 3 hours or after sweating, drying off with a towel, or swimming.  Always wear a seat belt when traveling in a car. Always wear a helmet when riding a bicycle or motorcycle.

## 2022-06-29 NOTE — Assessment & Plan Note (Signed)
-  continue regimen  -lipid, tsh, cmp

## 2022-07-04 ENCOUNTER — Inpatient Hospital Stay: Admit: 2022-07-04 | Payer: MEDICARE | Attending: Family Medicine | Primary: Family Medicine

## 2022-07-04 DIAGNOSIS — F17211 Nicotine dependence, cigarettes, in remission: Secondary | ICD-10-CM

## 2022-07-04 DIAGNOSIS — Z136 Encounter for screening for cardiovascular disorders: Secondary | ICD-10-CM

## 2022-07-04 LAB — VAS AAA SCREENING
Ao dist AP: 1.64 cm
Ao dist PSV: 83.4 cm/s
Ao dist TR: 1.72 cm
Ao mid AP: 2.01 cm
Ao mid PSV: 75.6 cm/s
Ao mid TR: 1.83 cm
Ao prox AP: 1.85 cm
Ao prox PSV: 70.3 cm/s
Ao prox TR: 1.81 cm
Left CIA AP: 1.12 cm
Left CIA TR: 0.88 cm
Left CIA mid PSV: 81.2 cm/s
Right CIA AP: 0.95 cm
Right CIA TR: 1.1 cm
Right CIA mid PSV: 88 cm/s

## 2022-07-04 NOTE — Other (Signed)
Please let pt know that ultrasound of the aorta was normal he does not have any aneurysm.   Yvone Neu MD

## 2022-07-04 NOTE — Other (Signed)
Patient informed of normal [place].

## 2022-07-04 NOTE — Other (Signed)
LM for patient to return call.

## 2022-07-15 ENCOUNTER — Encounter

## 2022-07-15 MED ORDER — ABRYSVO 120 MCG/0.5ML IM SOLR
120 MCG/0.5ML | Freq: Once | INTRAMUSCULAR | 0 refills | Status: AC
Start: 2022-07-15 — End: 2022-07-15

## 2022-07-20 ENCOUNTER — Encounter

## 2022-08-03 ENCOUNTER — Other Ambulatory Visit: Payer: Self-pay | Admitting: Internal Medicine

## 2022-08-03 DIAGNOSIS — E039 Hypothyroidism, unspecified: Secondary | ICD-10-CM

## 2022-08-08 ENCOUNTER — Encounter

## 2022-08-08 LAB — CBC
Hematocrit: 48.6 % (ref 38.0–52.0)
Hemoglobin: 17.4 g/dL — ABNORMAL HIGH (ref 13.0–17.3)
MCH: 32.2 pg (ref 27.0–34.5)
MCHC: 35.8 g/dL (ref 32.0–36.0)
MCV: 89.8 fL (ref 84.0–100.0)
MPV: 10.7 fL (ref 7.2–13.2)
NRBC Absolute: 0 10*3/uL (ref 0.000–0.012)
NRBC Automated: 0 % (ref 0.0–0.2)
Platelets: 222 10*3/uL (ref 140–440)
RBC: 5.41 x10e6/mcL (ref 4.00–5.60)
RDW: 13.3 % (ref 11.0–16.0)
WBC: 5 10*3/uL (ref 3.8–10.6)

## 2022-08-08 LAB — PSA, DIAGNOSTIC: PSA: 0.031 ng/mL (ref 0.000–4.000)

## 2022-08-08 LAB — HEPATIC FUNCTION PANEL
ALT: 35 U/L (ref 0–50)
AST: 22 U/L (ref 0–50)
Albumin: 4.3 g/dL (ref 3.5–5.2)
Alk Phosphatase: 76 U/L (ref 40–130)
Bilirubin, Direct: <0.2 mg/dL (ref 0.00–0.30)
Total Bilirubin: 0.36 mg/dL (ref 0.00–1.20)
Total Protein: 6.5 g/dL (ref 6.4–8.3)

## 2022-08-08 LAB — TESTOSTERONE: Testosterone: 98.9 ng/dL — ABNORMAL LOW (ref 193.0–740.0)

## 2022-08-10 ENCOUNTER — Encounter: Attending: Urology | Primary: Family Medicine

## 2022-08-23 ENCOUNTER — Other Ambulatory Visit: Payer: Self-pay | Admitting: Internal Medicine

## 2022-08-23 ENCOUNTER — Other Ambulatory Visit: Payer: Medicare Other

## 2022-08-23 ENCOUNTER — Telehealth: Payer: Self-pay

## 2022-08-23 DIAGNOSIS — E039 Hypothyroidism, unspecified: Secondary | ICD-10-CM

## 2022-08-23 DIAGNOSIS — Z21 Asymptomatic human immunodeficiency virus [HIV] infection status: Secondary | ICD-10-CM

## 2022-08-23 NOTE — Telephone Encounter (Signed)
Pt called after arriving to the lab on [address] and having no labs ordered to see if Dr. Ronnald Ramp could order the labs from Dr. Rudi Coco (Infectious Disease).   Pt has an appt scheduled on 09/19/22 to come in to see Dr. Ronnald Ramp. Pt is scheduled to go out of town at the end of this week and wanted them done before he left.  Please advise

## 2022-08-24 ENCOUNTER — Other Ambulatory Visit (INDEPENDENT_AMBULATORY_CARE_PROVIDER_SITE_OTHER): Payer: Medicare Other

## 2022-08-24 ENCOUNTER — Other Ambulatory Visit: Payer: Self-pay | Admitting: Internal Medicine

## 2022-08-24 DIAGNOSIS — Z21 Asymptomatic human immunodeficiency virus [HIV] infection status: Secondary | ICD-10-CM

## 2022-08-24 DIAGNOSIS — E039 Hypothyroidism, unspecified: Secondary | ICD-10-CM

## 2022-08-24 LAB — TSH: TSH: 0.04 u[IU]/mL — ABNORMAL LOW (ref 0.35–5.50)

## 2022-08-24 LAB — HEPATIC FUNCTION PANEL
ALT: 17 U/L (ref 0–53)
AST: 22 U/L (ref 0–37)
Albumin: 4.2 g/dL (ref 3.5–5.2)
Alkaline Phosphatase: 55 U/L (ref 39–117)
Bilirubin, Direct: 0.1 mg/dL (ref 0.0–0.3)
Total Bilirubin: 0.8 mg/dL (ref 0.2–1.2)
Total Protein: 6.5 g/dL (ref 6.0–8.3)

## 2022-08-24 LAB — BASIC METABOLIC PANEL
BUN: 18 mg/dL (ref 6–23)
CO2: 26 mEq/L (ref 19–32)
Calcium: 9.1 mg/dL (ref 8.4–10.5)
Chloride: 107 mEq/L (ref 96–112)
Creatinine, Ser: 0.95 mg/dL (ref 0.40–1.50)
GFR: 78.94 mL/min (ref >60.00)
Glucose, Bld: 111 mg/dL — ABNORMAL HIGH (ref 70–99)
Potassium: 3.7 mEq/L (ref 3.5–5.1)
Sodium: 140 mEq/L (ref 135–145)

## 2022-08-24 LAB — CBC WITH DIFFERENTIAL/PLATELET
Basophils Absolute: 0.1 10*3/uL (ref 0.0–0.1)
Basophils Relative: 1.3 % (ref 0.0–3.0)
Eosinophils Absolute: 0.1 10*3/uL (ref 0.0–0.7)
Eosinophils Relative: 2.3 % (ref 0.0–5.0)
HCT: 41.4 % (ref 39.0–52.0)
Hemoglobin: 14.3 g/dL (ref 13.0–17.0)
Lymphocytes Relative: 24.2 % (ref 12.0–46.0)
Lymphs Abs: 1.3 10*3/uL (ref 0.7–4.0)
MCHC: 34.6 g/dL (ref 30.0–36.0)
MCV: 94.5 fl (ref 78.0–100.0)
Monocytes Absolute: 0.5 10*3/uL (ref 0.1–1.0)
Monocytes Relative: 9.3 % (ref 3.0–12.0)
Neutro Abs: 3.3 10*3/uL (ref 1.4–7.7)
Neutrophils Relative %: 62.9 % (ref 43.0–77.0)
Platelets: 176 10*3/uL (ref 150.0–400.0)
RBC: 4.38 Mil/uL (ref 4.22–5.81)
RDW: 13.1 % (ref 11.5–15.5)
WBC: 5.3 10*3/uL (ref 4.0–10.5)

## 2022-08-24 MED ORDER — LEVOTHYROXINE SODIUM 125 MCG PO TABS: 125.0000 ug | ORAL_TABLET | Freq: Every day | ORAL | 0 refills | Status: DC

## 2022-08-24 NOTE — Telephone Encounter (Signed)
I called pt to inform him that the requested labs have been ordered.  FYI

## 2022-08-26 LAB — T-HELPER CELLS (CD4) COUNT (NOT AT ARMC)
Absolute CD4: 540 cells/uL (ref 490–1740)
CD4 T Helper %: 39 % (ref 30–61)
Total lymphocyte count: 1380 cells/uL (ref 850–3900)

## 2022-08-26 LAB — RPR: RPR Ser Ql: NONREACTIVE

## 2022-08-26 LAB — HIV-1 RNA QUANT-NO REFLEX-BLD
HIV 1 RNA Quant: NOT DETECTED Copies/mL
HIV-1 RNA Quant, Log: NOT DETECTED Log cps/mL

## 2022-09-11 ENCOUNTER — Encounter

## 2022-09-12 MED ORDER — ROSUVASTATIN CALCIUM 20 MG PO TABS
20 MG | ORAL_TABLET | Freq: Every day | ORAL | 1 refills | Status: DC
Start: 2022-09-12 — End: 2023-03-28

## 2022-09-19 ENCOUNTER — Ambulatory Visit (INDEPENDENT_AMBULATORY_CARE_PROVIDER_SITE_OTHER): Payer: Medicare Other | Admitting: Internal Medicine

## 2022-09-19 ENCOUNTER — Encounter: Payer: Self-pay | Admitting: Internal Medicine

## 2022-09-19 VITALS — BP 116/66 | HR 78 | Temp 98.4°F | Ht 74.0 in | Wt 199.0 lb

## 2022-09-19 DIAGNOSIS — R739 Hyperglycemia, unspecified: Secondary | ICD-10-CM

## 2022-09-19 DIAGNOSIS — E039 Hypothyroidism, unspecified: Secondary | ICD-10-CM | POA: Diagnosis not present

## 2022-09-19 DIAGNOSIS — Z21 Asymptomatic human immunodeficiency virus [HIV] infection status: Secondary | ICD-10-CM | POA: Diagnosis not present

## 2022-09-19 DIAGNOSIS — E785 Hyperlipidemia, unspecified: Secondary | ICD-10-CM

## 2022-09-19 NOTE — Progress Notes (Signed)
Subjective:  Patient ID: Zachary Levy, male    DOB: August 30, 1946  Age: 76 y.o. MRN: [id number]  CC: Hypothyroidism   HPI Zachary Levy presents for f/up -  He feels well on the current T4 dose. He's active and denies DOE, CP, SOB, edema.  Outpatient Medications Prior to Visit  Medication Sig Dispense Refill   bictegravir-emtricitabine-tenofovir AF (BIKTARVY) 50-200-25 MG TABS tablet Take 1 tablet by mouth daily.     levothyroxine (SYNTHROID) 125 MCG tablet Take 1 tablet (125 mcg total) by mouth daily. 90 tablet 0   Emtricitab-Rilpivir-Tenofov AF (ODEFSEY PO) Take by mouth 1 day or 1 dose. One at bedtime     ketoconazole (NIZORAL) 2 % shampoo Apply 1 Application topically 2 (two) times a week. 120 mL 0   raltegravir (ISENTRESS) 400 MG tablet Take 400 mg by mouth 2 (two) times daily.     terbinafine (LAMISIL AT) 1 % cream Apply 1 Application topically 2 (two) times daily. 30 g 0   bictegravir-emtricitabine-tenofovir AF (BIKTARVY) 50-200-25 MG TABS tablet Take 1 tablet by mouth daily.     No facility-administered medications prior to visit.    ROS Review of Systems  Constitutional: Negative.  Negative for diaphoresis and fatigue.  HENT: Negative.    Eyes: Negative.   Respiratory:  Negative for cough, chest tightness, shortness of breath and wheezing.   Cardiovascular:  Negative for chest pain, palpitations and leg swelling.  Gastrointestinal:  Negative for abdominal pain, constipation, diarrhea, nausea and vomiting.  Endocrine: Negative.   Genitourinary: Negative.  Negative for difficulty urinating.  Musculoskeletal: Negative.  Negative for arthralgias and myalgias.  Skin: Negative.   Neurological:  Negative for dizziness and weakness.  Hematological:  Negative for adenopathy. Does not bruise/bleed easily.  Psychiatric/Behavioral: Negative.      Objective:  BP 116/66 (BP Location: Left Arm, Patient Position: Sitting, Cuff Size: Large)   Pulse 78   Temp 98.4 F (36.9 C) (Oral)    Ht '6\' 2"'$  (1.88 m)   Wt 199 lb (90.3 kg)   SpO2 94%   BMI 25.55 kg/m   BP Readings from Last 3 Encounters:  09/19/22 116/66  05/26/22 124/80  03/07/22 134/78    Wt Readings from Last 3 Encounters:  09/19/22 199 lb (90.3 kg)  05/26/22 203 lb (92.1 kg)  03/07/22 206 lb (93.4 kg)    Physical Exam Vitals reviewed.  HENT:     Nose: Nose normal.     Mouth/Throat:     Mouth: Mucous membranes are moist.  Eyes:     General: No scleral icterus.    Conjunctiva/sclera: Conjunctivae normal.  Cardiovascular:     Rate and Rhythm: Normal rate and regular rhythm.     Heart sounds: No murmur heard. Pulmonary:     Effort: Pulmonary effort is normal.     Breath sounds: No stridor. No wheezing, rhonchi or rales.  Abdominal:     General: Abdomen is flat.     Palpations: There is no mass.     Tenderness: There is no abdominal tenderness. There is no guarding.     Hernia: No hernia is present.  Musculoskeletal:        General: Normal range of motion.     Cervical back: Neck supple.     Right lower leg: No edema.     Left lower leg: No edema.  Lymphadenopathy:     Cervical: No cervical adenopathy.  Skin:    General: Skin is warm and dry.  Neurological:  General: No focal deficit present.     Mental Status: He is alert.  Psychiatric:        Mood and Affect: Mood normal.        Behavior: Behavior normal.     Lab Results  Component Value Date   WBC 5.3 08/24/2022   HGB 14.3 08/24/2022   HCT 41.4 08/24/2022   PLT 176.0 08/24/2022   GLUCOSE 111 (H) 08/24/2022   CHOL 231 (H) 05/11/2021   TRIG 55.0 05/11/2021   HDL 72.00 05/11/2021   LDLCALC 148 (H) 05/11/2021   ALT 17 08/24/2022   AST 22 08/24/2022   NA 140 08/24/2022   K 3.7 08/24/2022   CL 107 08/24/2022   CREATININE 0.95 08/24/2022   BUN 18 08/24/2022   CO2 26 08/24/2022   TSH 0.04 (L) 08/24/2022   PSA 3.09 05/11/2021   INR 1.0 02/12/2007   HGBA1C 6.0 03/07/2022    DG Knee 1-2 Views Left  Result Date:  04/09/2013 *RADIOLOGY REPORT* Clinical Data: Knee pain, no trauma LEFT KNEE - 1-2 VIEW Comparison: None. Findings: There may be very minimal decrease in medial joint space. No significant degenerative spurring is seen although there is a tiny spur emanating from the superior aspect of the patella.  No fracture is seen and no effusion is noted. IMPRESSION: Minimal degenerative change with some loss of medial joint space and minimal patellar spurring. Original Report Authenticated By: Ivar Drape, M.D.   DG Knee 1-2 Views Right  Result Date: 04/09/2013 *RADIOLOGY REPORT* Clinical Data: Knee pain, no trauma RIGHT KNEE - 1-2 VIEW Comparison: None. Findings: Very minimal degenerative spurring is noted at the patellofemoral articulation.  No significant degenerative change is seen.  The joint spaces are relatively well preserved for age.  No effusion is noted. IMPRESSION: Minimal patellar spurring.  No significant degenerative change. Original Report Authenticated By: Ivar Drape, M.D.   DG Cervical Spine Complete  Result Date: 04/09/2013 *RADIOLOGY REPORT* Clinical Data: Neck and arm pain, no trauma CERVICAL SPINE - COMPLETE 4+ VIEW Comparison: None. Findings: The cervical vertebrae are in normal alignment.  There is degenerative disc disease at C5-6 and C6-7 with some loss of disc space, sclerosis, and spurring.  No prevertebral soft tissue swelling is seen.  There is mild foraminal narrowing at C5-6 with no significant narrowing at C6-7.  The odontoid process is intact. The lung apices are clear. IMPRESSION: Degenerative disc disease at C5-6 and C6-7 with mild foraminal narrowing at C5-6. Original Report Authenticated By: Ivar Drape, M.D.    Assessment & Plan:   Zachary Levy was seen today for hypothyroidism.  Diagnoses and all orders for this visit:  Adult hypothyroidism- The recent TSH was suppressed so the T4 dose was lowered. He is euthyroid.  Hyperglycemia  Dyslipidemia, goal LDL below 130- His is not  willing to take take a statin.  Asymptomatic HIV infection, with no history of HIV-related illness ([place])- His recent VL was undetectable.   I have discontinued Zachary Levy's raltegravir, Emtricitab-Rilpivir-Tenofov AF (ODEFSEY PO), bictegravir-emtricitabine-tenofovir AF, terbinafine, and ketoconazole. I am also having him maintain his levothyroxine and Biktarvy.  No orders of the defined types were placed in this encounter.    Follow-up: No follow-ups on file.  Scarlette Calico, MD

## 2022-10-20 DIAGNOSIS — H2513 Age-related nuclear cataract, bilateral: Secondary | ICD-10-CM | POA: Diagnosis not present

## 2022-10-20 DIAGNOSIS — H5203 Hypermetropia, bilateral: Secondary | ICD-10-CM | POA: Diagnosis not present

## 2022-10-27 ENCOUNTER — Telehealth: Payer: Self-pay | Admitting: Internal Medicine

## 2022-10-27 NOTE — Telephone Encounter (Signed)
Patient called and wanted a call back, He said that he normally gets his lab work done before he goes to [employer] [facility], He said they faxed [place] the order and he wanted to know if the labs can be out in so he can get done here. He said he has done in the past. Call back is [phone]. His appointment with the provider in [place] is next Thursday

## 2022-10-31 NOTE — Telephone Encounter (Signed)
Patient called back and would like someone to give him a call

## 2022-10-31 NOTE — Telephone Encounter (Signed)
Pt is unsure of what labs are needed.  I have provided [facility] fax number so [facility] can fax the order.

## 2022-11-01 ENCOUNTER — Other Ambulatory Visit (INDEPENDENT_AMBULATORY_CARE_PROVIDER_SITE_OTHER): Payer: Medicare Other

## 2022-11-01 ENCOUNTER — Other Ambulatory Visit: Payer: Self-pay | Admitting: Internal Medicine

## 2022-11-01 DIAGNOSIS — E039 Hypothyroidism, unspecified: Secondary | ICD-10-CM | POA: Diagnosis not present

## 2022-11-01 DIAGNOSIS — Z21 Asymptomatic human immunodeficiency virus [HIV] infection status: Secondary | ICD-10-CM | POA: Diagnosis not present

## 2022-11-01 NOTE — Telephone Encounter (Signed)
Pt is asking for a status update regarding his lab orders, please call him back at:    [phone]

## 2022-11-02 LAB — HEPATIC FUNCTION PANEL
ALT: 19 U/L (ref 0–53)
AST: 21 U/L (ref 0–37)
Albumin: 4.2 g/dL (ref 3.5–5.2)
Alkaline Phosphatase: 61 U/L (ref 39–117)
Bilirubin, Direct: 0.2 mg/dL (ref 0.0–0.3)
Total Bilirubin: 0.9 mg/dL (ref 0.2–1.2)
Total Protein: 6.6 g/dL (ref 6.0–8.3)

## 2022-11-02 LAB — BASIC METABOLIC PANEL
BUN: 18 mg/dL (ref 6–23)
CO2: 30 mEq/L (ref 19–32)
Calcium: 9.4 mg/dL (ref 8.4–10.5)
Chloride: 105 mEq/L (ref 96–112)
Creatinine, Ser: 0.93 mg/dL (ref 0.40–1.50)
GFR: 80.87 mL/min (ref >60.00)
Glucose, Bld: 102 mg/dL — ABNORMAL HIGH (ref 70–99)
Potassium: 4.3 mEq/L (ref 3.5–5.1)
Sodium: 141 mEq/L (ref 135–145)

## 2022-11-02 LAB — CBC WITH DIFFERENTIAL/PLATELET
Basophils Absolute: 0.1 10*3/uL (ref 0.0–0.1)
Basophils Relative: 1.2 % (ref 0.0–3.0)
Eosinophils Absolute: 0.2 10*3/uL (ref 0.0–0.7)
Eosinophils Relative: 2.9 % (ref 0.0–5.0)
HCT: 43 % (ref 39.0–52.0)
Hemoglobin: 14.9 g/dL (ref 13.0–17.0)
Lymphocytes Relative: 25.8 % (ref 12.0–46.0)
Lymphs Abs: 1.4 10*3/uL (ref 0.7–4.0)
MCHC: 34.6 g/dL (ref 30.0–36.0)
MCV: 93.8 fl (ref 78.0–100.0)
Monocytes Absolute: 0.5 10*3/uL (ref 0.1–1.0)
Monocytes Relative: 9.4 % (ref 3.0–12.0)
Neutro Abs: 3.3 10*3/uL (ref 1.4–7.7)
Neutrophils Relative %: 60.7 % (ref 43.0–77.0)
Platelets: 204 10*3/uL (ref 150.0–400.0)
RBC: 4.58 Mil/uL (ref 4.22–5.81)
RDW: 12.7 % (ref 11.5–15.5)
WBC: 5.4 10*3/uL (ref 4.0–10.5)

## 2022-11-02 LAB — TSH: TSH: 0.03 u[IU]/mL — ABNORMAL LOW (ref 0.35–5.50)

## 2022-11-03 DIAGNOSIS — E039 Hypothyroidism, unspecified: Secondary | ICD-10-CM | POA: Diagnosis not present

## 2022-11-03 DIAGNOSIS — Z7189 Other specified counseling: Secondary | ICD-10-CM | POA: Diagnosis not present

## 2022-11-03 DIAGNOSIS — B20 Human immunodeficiency virus [HIV] disease: Secondary | ICD-10-CM | POA: Diagnosis not present

## 2022-11-03 DIAGNOSIS — R351 Nocturia: Secondary | ICD-10-CM | POA: Diagnosis not present

## 2022-11-03 DIAGNOSIS — Z113 Encounter for screening for infections with a predominantly sexual mode of transmission: Secondary | ICD-10-CM | POA: Diagnosis not present

## 2022-11-03 DIAGNOSIS — G4700 Insomnia, unspecified: Secondary | ICD-10-CM | POA: Diagnosis not present

## 2022-11-03 DIAGNOSIS — E291 Testicular hypofunction: Secondary | ICD-10-CM | POA: Diagnosis not present

## 2022-11-03 LAB — T-HELPER CELLS (CD4) COUNT (NOT AT ARMC)
Absolute CD4: 592 cells/uL (ref 490–1740)
CD4 T Helper %: 37 % (ref 30–61)
Total lymphocyte count: 1580 cells/uL (ref 850–3900)

## 2022-11-03 LAB — HIV-1 RNA QUANT-NO REFLEX-BLD
HIV 1 RNA Quant: NOT DETECTED Copies/mL
HIV-1 RNA Quant, Log: NOT DETECTED Log cps/mL

## 2022-12-01 ENCOUNTER — Ambulatory Visit: Payer: Medicare Other | Admitting: Nurse Practitioner

## 2022-12-07 ENCOUNTER — Other Ambulatory Visit: Payer: Self-pay | Admitting: Internal Medicine

## 2022-12-07 DIAGNOSIS — E039 Hypothyroidism, unspecified: Secondary | ICD-10-CM

## 2022-12-07 MED ORDER — LEVOTHYROXINE SODIUM 112 MCG PO TABS: 112.0000 ug | ORAL_TABLET | Freq: Every day | ORAL | 0 refills | Status: DC

## 2022-12-20 ENCOUNTER — Emergency Department: Admit: 2022-12-20 | Payer: MEDICARE | Primary: Family Medicine

## 2022-12-20 ENCOUNTER — Inpatient Hospital Stay: Admit: 2022-12-20 | Discharge: 2022-12-20 | Disposition: A | Discharge status: Home / Self Care | Payer: MEDICARE

## 2022-12-20 ENCOUNTER — Ambulatory Visit: Payer: Medicare Other | Admitting: Internal Medicine

## 2022-12-20 DIAGNOSIS — J101 Influenza due to other identified influenza virus with other respiratory manifestations: Secondary | ICD-10-CM

## 2022-12-20 LAB — COVID-19 & INFLUENZA COMBO (LIAT HOSPITAL)
INFLUENZA A: DETECTED — AB
INFLUENZA B: NOT DETECTED
SARS-CoV-2: NOT DETECTED

## 2022-12-20 MED ORDER — IBUPROFEN 800 MG PO TABS
800 MG | Freq: Once | ORAL | Status: AC
Start: 2022-12-20 — End: 2022-12-20

## 2022-12-20 MED ORDER — HYDROCODONE BIT-HOMATROP MBR 5-1.5 MG/5ML PO SOLN
Freq: Once | ORAL | Status: AC
Start: 2022-12-20 — End: 2022-12-20
  Administered 2022-12-20: 19:00:00 5 mL via ORAL

## 2022-12-20 MED ORDER — HYDROCOD POLI-CHLORPHE POLI ER 10-8 MG/5ML PO SUER
10-8 MG/5ML | Freq: Two times a day (BID) | ORAL | 0 refills | Status: DC | PRN
Start: 2022-12-20 — End: 2022-12-20

## 2022-12-20 MED ORDER — IBUPROFEN 800 MG PO TABS
800 MG | ORAL | Status: AC
Start: 2022-12-20 — End: 2022-12-20
  Administered 2022-12-20: 20:00:00 800 via ORAL

## 2022-12-20 MED ORDER — HYDROCODONE BIT-HOMATROP MBR 5-1.5 MG/5ML PO SOLN
Freq: Four times a day (QID) | ORAL | 0 refills | Status: AC | PRN
Start: 2022-12-20 — End: 2022-12-25

## 2022-12-20 MED ORDER — BENZONATATE 200 MG PO CAPS
200 MG | ORAL_CAPSULE | Freq: Three times a day (TID) | ORAL | 0 refills | Status: AC | PRN
Start: 2022-12-20 — End: 2022-12-27

## 2022-12-20 MED ORDER — ACETAMINOPHEN 500 MG PO TABS
500 MG | ORAL | Status: AC
Start: 2022-12-20 — End: 2022-12-20
  Administered 2022-12-20: 19:00:00 1000 mg via ORAL

## 2022-12-20 MED FILL — IBUPROFEN 800 MG PO TABS: 800 MG | ORAL | Qty: 1

## 2022-12-20 MED FILL — HYDROCODONE BIT-HOMATROP MBR 5-1.5 MG/5ML PO SOLN: ORAL | Qty: 5

## 2022-12-20 MED FILL — TYLENOL EXTRA STRENGTH 500 MG PO TABS: 500 MG | ORAL | Qty: 2

## 2022-12-20 NOTE — ED Provider Notes (Signed)
[facility] DE[facility]  EMERGENCY DEPARTMENT ENCOUNTER      Pt Name: Oscar Turner  MRN: [id number]  [place] 09-Jul-1946  Provider evaluation time: 12/20/22 1308  Provider: Levie Heritage, PA-C    CHIEF COMPLAINT       Chief Complaint   Patient presents with    Cough     Cough, congestion, overall feeling bad started Sunday. Pt states was in [facility] Friday, came back midnight this morning.          HISTORY OF PRESENT ILLNESS    Oscar Turner is a 77 y.o. male  who presents to the emergency department evaluation of cough, congestion since Sunday.  States that he has had numerous coughing fits.  His wife states that occasionally his breathing sounds funny.  States the cough is occasionally productive.  Patient is febrile here to 101.1 on arrival.  Denies any chest pain, shortness of breath, abdominal pain, nausea or vomiting.    Nursing Notes were reviewed.    REVIEW OF SYSTEMS     Review of Systems   Constitutional:  Positive for fever.   HENT:  Positive for congestion. Negative for ear pain.    Respiratory:  Positive for cough. Negative for shortness of breath.    Gastrointestinal:  Negative for abdominal pain, nausea and vomiting.   All other systems reviewed and are negative.         PAST MEDICAL HISTORY     Past Medical History:   Diagnosis Date    Complete rotator cuff tear of left shoulder 06/04/2021    S/p repair.    Compression fracture of lumbosacral spine ([place]) 06/04/2021    COVID-19 12/22/2021    HLD (hyperlipidemia)     Low testosterone     Nontraumatic complete tear of right rotator cuff 06/04/2021    S/p repair.    Prostate cancer [facility])     Skin cancer of face     s/p excision         SURGICAL HISTORY       Past Surgical History:   Procedure Laterality Date    [place]    OTHER SURGICAL HISTORY  08/2014    Compression fracture of L4    PROSTATECTOMY  02/2011    radical    ROTATOR CUFF REPAIR Right 10/11/2018    Dr. Kayleen Memos    SHOULDER ARTHROSCOPY W/ ROTATOR CUFF REPAIR Left 05/11/2017     Dr. Maryfrances Bunnell    TONSILLECTOMY           CURRENT MEDICATIONS       Previous Medications    COENZYME Q10 10 MG CAPS    Take by mouth    GARLIC 616 MG CAPS    Take by mouth    MULTIPLE VITAMINS-MINERALS (MULTI FOR HIM) TABS    Take by mouth    OMEGA-3 FATTY ACIDS (FISH OIL) 500 MG CAPS    Take 500 mg by mouth 2 times daily    ROSUVASTATIN (CRESTOR) 20 MG TABLET    TAKE ONE TABLET BY MOUTH DAILY    TESTOSTERONE CYPIONATE (DEPOTESTOTERONE CYPIONATE) 200 MG/ML INJECTION    Inject 1 mL into the muscle every 14 days for 90 days.    TURMERIC 500 MG CAPS    Take by mouth As directed       ALLERGIES     Patient has no known allergies.    FAMILY HISTORY  Family History   Problem Relation Age of Onset    Other Mother         Dialysis    Stroke Father     Lung Cancer Father     Hypertension Father     Gout Father     Alcohol Abuse Brother     No Known Problems Brother     No Known Problems Daughter     No Known Problems Daughter     No Known Problems Other           SOCIAL HISTORY       Social History     Socioeconomic History    Marital status: Married   Tobacco Use    Smoking status: Former     Current packs/day: 0.00     Average packs/day: 1 pack/day for 30.0 years (30.0 ttl pk-yrs)     Types: Cigarettes     Start date: 79     Quit date: 11/22/2015     Years since quitting: 7.0    Smokeless tobacco: Never   Substance and Sexual Activity    Alcohol use: Yes     Alcohol/week: 7.0 standard drinks of alcohol     Types: 7 Standard drinks or equivalent per week    Drug use: Never    Sexual activity: Yes     Partners: Female     Comment: married     Social Determinants of Health     Physical Activity: Sufficiently Active (06/29/2022)    Exercise Vital Sign     Days of Exercise per Week: 5 days     Minutes of Exercise per Session: 110 min         PHYSICAL EXAM       ED Triage Vitals [12/20/22 1235]   BP Temp Temp Source Pulse Respirations SpO2 Height Weight - Scale   123/75 (!) 101.1 F (38.4 C) Oral 81 18 97 % 1.727 m  ('5\' 8"'$ ) 74.8 kg (165 lb)     Physical Exam  Vitals and nursing note reviewed.   Constitutional:       Appearance: Normal appearance.   HENT:      Head: Normocephalic and atraumatic.      Nose: Nose normal.      Mouth/Throat:      Mouth: Mucous membranes are moist.   Eyes:      Conjunctiva/sclera: Conjunctivae normal.   Cardiovascular:      Rate and Rhythm: Normal rate and regular rhythm.   Pulmonary:      Effort: Pulmonary effort is normal. No respiratory distress.      Breath sounds: Normal breath sounds. No wheezing, rhonchi or rales.   Abdominal:      Palpations: Abdomen is soft.      Tenderness: There is no abdominal tenderness.   Musculoskeletal:         General: Normal range of motion.   Skin:     General: Skin is warm and dry.   Neurological:      General: No focal deficit present.      Mental Status: He is alert and oriented to person, place, and time.   Psychiatric:         Mood and Affect: Mood normal.         Behavior: Behavior normal.          DIAGNOSTIC RESULTS     RADIOLOGY:    XR CHEST (2 VW)   Final Result  No evidence of acute cardiopulmonary disease.            LABS:  Recent Results (from the past 24 hour(s))   COVID-19 & Influenza Combo [facility])    Collection Time: 12/20/22 12:41 PM    Specimen: Nasopharyngeal Swab   Result Value Ref Range    SARS-CoV-2 Not Detected Not Detected    INFLUENZA A Detected (A) Not Detected    INFLUENZA B Not Detected Not Detected        EMERGENCY DEPARTMENT COURSE and DIFFERENTIAL DIAGNOSIS/MDM:   Vitals:    Vitals:    12/20/22 1235 12/20/22 1445   BP: 123/75    Pulse: 81    Resp: 18    Temp: (!) 101.1 F (38.4 C) (!) 102.3 F (39.1 C)   TempSrc: Oral    SpO2: 97%    Weight: 74.8 kg (165 lb)    Height: 1.727 m ('5\' 8"'$ )        Medical Decision Making  Presents with fever and cough since Sunday  Febrile on arrival otherwise vital signs unremarkable  Medicated with Tylenol here in ED  Positive for influenza A  Chest x-ray shows no evidence of pneumonia  Did  recheck temperature prior to discharge.  Patient's temperature uptrending.  Was 102.3.  Will give a dose of Motrin.  Discussed continuing to alternate Tylenol and Motrin.  I do not know that obscene patient at this time would be of any benefit.   Discussed importance of Tylenol and Motrin for fever control  Will provide with Tessalon Perle and Tussionex for cough control.  Medication warnings given  Stable for outpatient follow-up with PCP.  Return ED precautions given.    Amount and/or Complexity of Data Reviewed  Labs:  Decision-making details documented in ED Course.  Radiology: ordered. Decision-making details documented in ED Course.    Risk  OTC drugs.  Prescription drug management.         REASSESSMENT     ED Course as of 12/20/22 1500   Tue Dec 20, 2022   1307 INFLUENZA A(!): Detected [AC]   1433 XR CHEST (2 VW)  No evidence of acute cardiopulmonary disease. [AC]   1451 Fever uptrending.  Currently 102.3.  Will give 800 mg of Motrin.  Discussed with patient and patient's wife to recheck temperature when they arrive home and repeat Tylenol if needed. [AC]      ED Course User Index  [AC] Jeanne Diefendorf Lyn, PA-C         FINAL IMPRESSION      1. Influenza A          DISPOSITION/PLAN   DISPOSITION Decision To Discharge 12/20/2022 02:43:34 PM      PATIENT REFERRED TO:  [name], MD  [address] [place] [address]  [phone]    Schedule an appointment as soon as possible for a visit         DISCHARGE MEDICATIONS:  New Prescriptions    BENZONATATE (TESSALON) 200 MG CAPSULE    Take 1 capsule by mouth 3 times daily as needed for Cough    HYDROCODONE-CHLORPHENIRAMINE (TUSSIONEX) 10-8 MG/5ML SUER    Take 5 mLs by mouth every 12 hours as needed (cough) for up to 5 days. Max Daily Amount: 10 mLs         (Please note that portions of this note were completed with a voice recognition program.  Efforts were made to edit the  dictations but occasionally words are  mis-transcribed.)    Levie Heritage, PA-C (electronically signed)  Emergency Physician Assistant           Levie Heritage, PA-C  12/20/22 [phone]

## 2022-12-20 NOTE — Discharge Instructions (Signed)
Today your chest x-ray shows no evidence of pneumonia  You did test positive for influenza A  Alternate Tylenol and Motrin for pain/fever control  Take Tessalon Perles as needed for cough control  If you need additional cough control you can take Tussionex.  This medication does have a narcotic in it.  Do not drive or operate heavy machinery after taking this medication  Follow-up with your primary care provider at first available appointment  Return to the emergency department immediately for any new or worsening symptoms

## 2022-12-21 NOTE — Telephone Encounter (Signed)
From: Trellis Moment  To: Dr. [name]  Sent: 12/21/2022 12:52 PM EST  Subject: ER Visit    I was in the ER yesterday and was diagnosed with Influenza Type A. I was told to see Dr. Edward Jolly as soon as possible. My symptoms have not improved yet. still running fever. When can I get into see the doctor?  Oscar Turner  [phone]

## 2022-12-21 NOTE — Telephone Encounter (Signed)
Called patient, spoke with wife, she sent original MyChart message.  Wife states patient has been resting, doing better with cough syrup, tylenol and motrin.  Wife agrees to keep a close eye on his symptoms and if symptoms worsen or if he experiences chest tightness, shortness of breath she agrees to call us and return to ED immediately.

## 2023-01-02 ENCOUNTER — Encounter: Attending: Family Medicine | Primary: Family Medicine

## 2023-01-30 DIAGNOSIS — L570 Actinic keratosis: Secondary | ICD-10-CM | POA: Diagnosis not present

## 2023-01-30 DIAGNOSIS — Z85828 Personal history of other malignant neoplasm of skin: Secondary | ICD-10-CM | POA: Diagnosis not present

## 2023-01-30 DIAGNOSIS — D225 Melanocytic nevi of trunk: Secondary | ICD-10-CM | POA: Diagnosis not present

## 2023-01-30 DIAGNOSIS — L821 Other seborrheic keratosis: Secondary | ICD-10-CM | POA: Diagnosis not present

## 2023-02-07 ENCOUNTER — Encounter

## 2023-02-08 ENCOUNTER — Inpatient Hospital Stay: Admit: 2023-02-08 | Payer: MEDICARE | Primary: Family Medicine

## 2023-02-08 DIAGNOSIS — E291 Testicular hypofunction: Secondary | ICD-10-CM

## 2023-02-08 DIAGNOSIS — C61 Malignant neoplasm of prostate: Secondary | ICD-10-CM

## 2023-02-08 LAB — COMPREHENSIVE METABOLIC PANEL
ALT: 31 U/L (ref 0–50)
AST: 23 U/L (ref 0–50)
Albumin/Globulin Ratio: 2 (ref 1.00–2.70)
Albumin: 4.2 g/dL (ref 3.5–5.2)
Alk Phosphatase: 64 U/L (ref 40–130)
Anion Gap: 13 mmol/L (ref 2–17)
BUN: 17 mg/dL (ref 8–23)
CO2: 27 mmol/L (ref 22–29)
Calcium: 9.2 mg/dL (ref 8.5–10.7)
Chloride: 99 mmol/L (ref 98–107)
Creatinine: 0.9 mg/dL (ref 0.7–1.3)
Est, Glom Filt Rate: 90 mL/min/1.73m (ref >=60)
Globulin: 2 g/dL (ref 1.9–4.4)
Glucose: 137 mg/dL — ABNORMAL HIGH (ref 70–99)
OSMOLALITY CALCULATED: 281 mOsm/kg (ref 270–287)
Potassium: 4.3 mmol/L (ref 3.5–5.3)
Sodium: 139 mmol/L (ref 135–145)
Total Bilirubin: 0.43 mg/dL (ref 0.00–1.20)
Total Protein: 6.4 g/dL (ref 5.7–8.3)

## 2023-02-08 LAB — HEMATOCRIT: Hematocrit: 47.5 % (ref 38.0–52.0)

## 2023-02-08 LAB — HEMOGLOBIN: Hemoglobin: 16.6 g/dL (ref 13.0–17.3)

## 2023-02-08 LAB — PSA, DIAGNOSTIC: PSA: 0.046 ng/mL (ref 0.000–4.000)

## 2023-02-12 LAB — TESTOSTERONE, FREE/TOT EQUILIB
Testosterone % Free: 2.37 % (ref 1.50–4.20)
Testosterone, Free: 8.03 ng/dL (ref 5.00–21.00)
Testosterone: 339 ng/dL (ref 264–916)

## 2023-02-21 ENCOUNTER — Encounter: Payer: Self-pay | Admitting: Internal Medicine

## 2023-02-25 ENCOUNTER — Other Ambulatory Visit: Payer: Self-pay | Admitting: Internal Medicine

## 2023-02-25 DIAGNOSIS — N400 Enlarged prostate without lower urinary tract symptoms: Secondary | ICD-10-CM

## 2023-02-25 DIAGNOSIS — R739 Hyperglycemia, unspecified: Secondary | ICD-10-CM

## 2023-02-25 DIAGNOSIS — E785 Hyperlipidemia, unspecified: Secondary | ICD-10-CM

## 2023-02-25 DIAGNOSIS — E039 Hypothyroidism, unspecified: Secondary | ICD-10-CM

## 2023-02-25 NOTE — Progress Notes (Unsigned)
Lab Results  Component Value Date   WBC 5.4 11/01/2022   HGB 14.9 11/01/2022   HCT 43.0 11/01/2022   PLT 204.0 11/01/2022   GLUCOSE 102 (H) 11/01/2022   CHOL 231 (H) 05/11/2021   TRIG 55.0 05/11/2021   HDL 72.00 05/11/2021   LDLCALC 148 (H) 05/11/2021   ALT 19 11/01/2022   AST 21 11/01/2022   NA 141 11/01/2022   K 4.3 11/01/2022   CL 105 11/01/2022   CREATININE 0.93 11/01/2022   BUN 18 11/01/2022   CO2 30 11/01/2022   TSH 0.03 (L) 11/01/2022   PSA 3.09 05/11/2021   INR 1.0 02/12/2007   HGBA1C 6.0 03/07/2022

## 2023-02-27 ENCOUNTER — Other Ambulatory Visit (INDEPENDENT_AMBULATORY_CARE_PROVIDER_SITE_OTHER): Payer: Medicare Other

## 2023-02-27 DIAGNOSIS — R739 Hyperglycemia, unspecified: Secondary | ICD-10-CM | POA: Diagnosis not present

## 2023-02-27 DIAGNOSIS — N400 Enlarged prostate without lower urinary tract symptoms: Secondary | ICD-10-CM | POA: Diagnosis not present

## 2023-02-27 DIAGNOSIS — E785 Hyperlipidemia, unspecified: Secondary | ICD-10-CM | POA: Diagnosis not present

## 2023-02-27 DIAGNOSIS — E039 Hypothyroidism, unspecified: Secondary | ICD-10-CM

## 2023-02-27 LAB — BASIC METABOLIC PANEL
BUN: 15 mg/dL (ref 6–23)
CO2: 26 mEq/L (ref 19–32)
Calcium: 9.2 mg/dL (ref 8.4–10.5)
Chloride: 105 mEq/L (ref 96–112)
Creatinine, Ser: 0.91 mg/dL (ref 0.40–1.50)
GFR: 82.82 mL/min (ref >60.00)
Glucose, Bld: 95 mg/dL (ref 70–99)
Potassium: 4.1 mEq/L (ref 3.5–5.1)
Sodium: 138 mEq/L (ref 135–145)

## 2023-02-27 LAB — LIPID PANEL
Cholesterol: 223 mg/dL — ABNORMAL HIGH (ref 0–200)
HDL: 60.1 mg/dL (ref >39.00)
LDL Cholesterol: 145 mg/dL — ABNORMAL HIGH (ref 0–99)
NonHDL: 163.05
Total CHOL/HDL Ratio: 4
Triglycerides: 89 mg/dL (ref 0.0–149.0)
VLDL: 17.8 mg/dL (ref 0.0–40.0)

## 2023-02-27 LAB — HEMOGLOBIN A1C: Hgb A1c MFr Bld: 5.8 % (ref 4.6–6.5)

## 2023-02-27 LAB — PSA: PSA: 4.18 ng/mL — ABNORMAL HIGH (ref 0.10–4.00)

## 2023-02-27 LAB — TSH: TSH: 0.31 u[IU]/mL — ABNORMAL LOW (ref 0.35–5.50)

## 2023-03-06 ENCOUNTER — Encounter: Payer: Self-pay | Admitting: Internal Medicine

## 2023-03-06 ENCOUNTER — Ambulatory Visit (INDEPENDENT_AMBULATORY_CARE_PROVIDER_SITE_OTHER): Payer: Medicare Other | Admitting: Internal Medicine

## 2023-03-06 VITALS — BP 122/78 | HR 61 | Temp 98.0°F | Resp 16 | Ht 74.0 in | Wt 201.0 lb

## 2023-03-06 DIAGNOSIS — E039 Hypothyroidism, unspecified: Secondary | ICD-10-CM

## 2023-03-06 DIAGNOSIS — E785 Hyperlipidemia, unspecified: Secondary | ICD-10-CM | POA: Diagnosis not present

## 2023-03-06 DIAGNOSIS — R972 Elevated prostate specific antigen [PSA]: Secondary | ICD-10-CM

## 2023-03-06 MED ORDER — LEVOTHYROXINE SODIUM 100 MCG PO TABS
100.0000 ug | ORAL_TABLET | Freq: Every day | ORAL | 0 refills | Status: DC
Start: 2023-03-06 — End: 2023-05-29

## 2023-03-06 NOTE — Progress Notes (Signed)
Subjective:  Patient ID: Zachary Levy, male    DOB: 05-01-1946  Age: 77 y.o. MRN: [id number]  CC: Hyperlipidemia and Hypothyroidism   HPI Zachary Levy presents for f/up ----  He recently climbed a mountain and had good exertion.  He denies chest pain, shortness of breath, diaphoresis, or edema.  Outpatient Medications Prior to Visit  Medication Sig Dispense Refill   bictegravir-emtricitabine-tenofovir AF (BIKTARVY) 50-200-25 MG TABS tablet Take 1 tablet by mouth daily.     zolpidem (AMBIEN) 5 MG tablet Take 5 mg by mouth as needed for sleep.     levothyroxine (SYNTHROID) 112 MCG tablet Take 1 tablet (112 mcg total) by mouth daily. 90 tablet 0   No facility-administered medications prior to visit.    ROS Review of Systems  Constitutional:  Positive for fatigue. Negative for appetite change, chills, diaphoresis and unexpected weight change.  HENT: Negative.    Respiratory: Negative.  Negative for cough, chest tightness, shortness of breath and wheezing.   Cardiovascular:  Negative for chest pain, palpitations and leg swelling.  Gastrointestinal:  Negative for abdominal pain, constipation, diarrhea, nausea and vomiting.  Endocrine: Negative for cold intolerance and heat intolerance.  Genitourinary: Negative.   Musculoskeletal: Negative.  Negative for arthralgias and myalgias.  Skin: Negative.   Neurological: Negative.  Negative for dizziness and weakness.  Hematological:  Negative for adenopathy. Does not bruise/bleed easily.  Psychiatric/Behavioral: Negative.      Objective:  BP 122/78 (BP Location: Left Arm, Patient Position: Sitting, Cuff Size: Normal)   Pulse 61   Temp 98 F (36.7 C) (Oral)   Resp 16   Ht  (1.88 m)   Wt 201 lb (91.2 kg)   SpO2 96%   BMI 25.81 kg/m   BP Readings from Last 3 Encounters:  03/06/23 122/78  09/19/22 116/66  05/26/22 124/80    Wt Readings from Last 3 Encounters:  03/06/23 201 lb (91.2 kg)  09/19/22 199 lb (90.3 kg)  05/26/22  203 lb (92.1 kg)    Physical Exam Vitals reviewed.  Constitutional:      Appearance: Normal appearance.  HENT:     Mouth/Throat:     Mouth: Mucous membranes are moist.  Eyes:     General: No scleral icterus.    Conjunctiva/sclera: Conjunctivae normal.  Cardiovascular:     Rate and Rhythm: Normal rate and regular rhythm.     Heart sounds: No murmur heard. Pulmonary:     Effort: Pulmonary effort is normal.     Breath sounds: No stridor. No wheezing, rhonchi or rales.  Abdominal:     General: Abdomen is flat.     Palpations: There is no mass.     Tenderness: There is no abdominal tenderness. There is no guarding.     Hernia: No hernia is present.  Musculoskeletal:        General: Normal range of motion.     Cervical back: Neck supple.     Right lower leg: No edema.     Left lower leg: No edema.  Lymphadenopathy:     Cervical: No cervical adenopathy.  Skin:    General: Skin is warm and dry.     Coloration: Skin is not pale.  Neurological:     General: No focal deficit present.     Mental Status: He is alert. Mental status is at baseline.  Psychiatric:        Mood and Affect: Mood normal.        Behavior: Behavior normal.  Lab Results  Component Value Date   WBC 5.4 11/01/2022   HGB 14.9 11/01/2022   HCT 43.0 11/01/2022   PLT 204.0 11/01/2022   GLUCOSE 95 02/27/2023   CHOL 223 (H) 02/27/2023   TRIG 89.0 02/27/2023   HDL 60.10 02/27/2023   LDLCALC 145 (H) 02/27/2023   ALT 19 11/01/2022   AST 21 11/01/2022   NA 138 02/27/2023   K 4.1 02/27/2023   CL 105 02/27/2023   CREATININE 0.91 02/27/2023   BUN 15 02/27/2023   CO2 26 02/27/2023   TSH 0.31 (L) 02/27/2023   PSA 4.18 (H) 02/27/2023   INR 1.0 02/12/2007   HGBA1C 5.8 02/27/2023    DG Knee 1-2 Views Left  Result Date: 04/09/2013 *RADIOLOGY REPORT* Clinical Data: Knee pain, no trauma LEFT KNEE - 1-2 VIEW Comparison: None. Findings: There may be very minimal decrease in medial joint space. No significant  degenerative spurring is seen although there is a tiny spur emanating from the superior aspect of the patella.  No fracture is seen and no effusion is noted. IMPRESSION: Minimal degenerative change with some loss of medial joint space and minimal patellar spurring. Original Report Authenticated By: Dwyane Dee, M.D.   DG Knee 1-2 Views Right  Result Date: 04/09/2013 *RADIOLOGY REPORT* Clinical Data: Knee pain, no trauma RIGHT KNEE - 1-2 VIEW Comparison: None. Findings: Very minimal degenerative spurring is noted at the patellofemoral articulation.  No significant degenerative change is seen.  The joint spaces are relatively well preserved for age.  No effusion is noted. IMPRESSION: Minimal patellar spurring.  No significant degenerative change. Original Report Authenticated By: Dwyane Dee, M.D.   DG Cervical Spine Complete  Result Date: 04/09/2013 *RADIOLOGY REPORT* Clinical Data: Neck and arm pain, no trauma CERVICAL SPINE - COMPLETE 4+ VIEW Comparison: None. Findings: The cervical vertebrae are in normal alignment.  There is degenerative disc disease at C5-6 and C6-7 with some loss of disc space, sclerosis, and spurring.  No prevertebral soft tissue swelling is seen.  There is mild foraminal narrowing at C5-6 with no significant narrowing at C6-7.  The odontoid process is intact. The lung apices are clear. IMPRESSION: Degenerative disc disease at C5-6 and C6-7 with mild foraminal narrowing at C5-6. Original Report Authenticated By: Dwyane Dee, M.D.    Assessment & Plan:   Dyslipidemia, goal LDL below 130- He is not willing to take a statin.  Will get a coronary calcium score to gauge his risk for coronary artery disease. -     CT CARDIAC SCORING ([facility] LOCATIONS ONLY); Future  Adult hypothyroidism- His TSH is suppressed.  Will lower his T4 dosage. -     Levothyroxine Sodium; Take 1 tablet (100 mcg total) by mouth daily.  Dispense: 90 tablet; Refill: 0  Rising PSA level- Will recheck this in 3 to 4  months.     Follow-up: No follow-ups on file.  Sanda Linger, MD

## 2023-03-08 ENCOUNTER — Encounter

## 2023-03-08 ENCOUNTER — Encounter: Payer: MEDICARE | Attending: Medical | Primary: Family Medicine

## 2023-03-08 DIAGNOSIS — M25551 Pain in right hip: Secondary | ICD-10-CM

## 2023-03-08 MED ORDER — ROSUVASTATIN CALCIUM 20 MG PO TABS
20 MG | ORAL_TABLET | Freq: Every day | ORAL | 0 refills | Status: DC
Start: 2023-03-08 — End: 2023-03-28

## 2023-03-08 NOTE — Telephone Encounter (Signed)
From: Blanch Media  To: Dr. Rochele Raring  Sent: 03/08/2023 8:49 AM EDT  Subject: medication refill    My Crestor needs to be refilled. Can you do this?  Sherlyn Lees

## 2023-03-08 NOTE — Telephone Encounter (Signed)
I sent 30 days to last until next appt

## 2023-03-11 ENCOUNTER — Encounter: Payer: Self-pay | Admitting: Internal Medicine

## 2023-03-15 ENCOUNTER — Encounter: Payer: MEDICARE | Attending: Orthopaedic Surgery | Primary: Family Medicine

## 2023-03-17 ENCOUNTER — Encounter: Payer: MEDICARE | Attending: Medical | Primary: Family Medicine

## 2023-03-17 ENCOUNTER — Encounter: Attending: Medical | Primary: Family Medicine

## 2023-03-28 ENCOUNTER — Ambulatory Visit: Admit: 2023-03-28 | Discharge: 2023-03-28 | Payer: MEDICARE | Attending: Family Medicine | Primary: Family Medicine

## 2023-03-28 DIAGNOSIS — E782 Mixed hyperlipidemia: Secondary | ICD-10-CM

## 2023-03-28 LAB — AMB POC HEMOGLOBIN A1C: Hemoglobin A1C, POC: 5.5 %

## 2023-03-28 MED ORDER — ROSUVASTATIN CALCIUM 20 MG PO TABS
20 | ORAL_TABLET | Freq: Every day | ORAL | 2 refills | Status: AC
Start: 2023-03-28 — End: 2023-12-23

## 2023-03-28 NOTE — Progress Notes (Signed)
[facility]  Oscar Horn, MD   [address] [place] [address]  Phone:  [phone]  Fax:  [phone]    CHIEF COMPLAINT:  Chief Complaint   Patient presents with    Established New Doctor      HISTORY OF PRESENT ILLNESS:  Oscar Turner is a 77 y.o. male  who presents today to establish care with a new PCP in our office. His PMH is significant for hyperlipidemia, mild bilateral carotid artery stenosis, polycythemia, prostate cancer s/p radical prostatectomy, and melanoma. He reports no acute concerns or complaints at this time. He is due for an updated lipid panel and medication refills. In addition, recent labs showed that his blood glucose was elevated and Hgb A1C is needed for follow-up.    Current Outpatient Medications   Medication Sig Dispense Refill    sildenafil (VIAGRA) 100 MG tablet Take by mouth      rosuvastatin (CRESTOR) 20 MG tablet Take 1 tablet by mouth daily 90 tablet 2    Coenzyme Q10 10 MG CAPS Take by mouth      testosterone cypionate (DEPOTESTOTERONE CYPIONATE) 200 MG/ML injection Inject 1 mL into the muscle every 14 days for 90 days. 2 mL 2    Multiple Vitamins-Minerals (MULTI FOR HIM) TABS Take by mouth      Garlic 300 MG CAPS Take by mouth      Omega-3 Fatty Acids (FISH OIL) 500 MG CAPS Take 500 mg by mouth 2 times daily      turmeric 500 MG CAPS Take by mouth As directed       No current facility-administered medications for this visit.        Past Medical History:   Diagnosis Date    Complete rotator cuff tear of left shoulder 06/04/2021    S/p repair.    Compression fracture of lumbosacral spine (HCC) 06/04/2021    HLD (hyperlipidemia)     Low testosterone     Melanoma in situ (HCC)     Chest. S/P excision.    Nontraumatic complete tear of right rotator cuff 06/04/2021    S/p repair.    Prostate cancer [facility])     Skin cancer of face     s/p excision. Dr. Sunny Schlein       REVIEW OF SYSTEMS:  Review of Systems   Constitutional:  Negative for activity  change, appetite change, chills, fever and unexpected weight change.   Respiratory:  Negative for cough and shortness of breath.    Cardiovascular:  Negative for chest pain, palpitations and leg swelling.   Gastrointestinal:  Negative for abdominal pain and blood in stool.   Skin:  Negative for rash.   Neurological:  Negative for dizziness, light-headedness and headaches.   Psychiatric/Behavioral:  Negative for dysphoric mood, sleep disturbance and suicidal ideas. The patient is not nervous/anxious.    Review of systems is as indicated above and in HPI, otherwise negative.    PHYSICAL EXAM:  Vital Signs:  Vitals:    03/28/23 1452   BP: 132/70   Pulse: 60   SpO2: 96%   Weight: 75.3 kg (166 lb)   Height: 1.727 m (5' 7.99")      Physical Exam  Vitals and nursing note reviewed.   Constitutional:       General: He is not in acute distress.     Appearance: Normal appearance. He is normal weight.   HENT:  Head: Normocephalic and atraumatic.      Right Ear: Tympanic membrane, ear canal and external ear normal.      Left Ear: Tympanic membrane, ear canal and external ear normal.      Nose: Nose normal.      Mouth/Throat:      Mouth: Mucous membranes are moist.   Eyes:      General: No scleral icterus.     Extraocular Movements: Extraocular movements intact.      Conjunctiva/sclera: Conjunctivae normal.      Pupils: Pupils are equal, round, and reactive to light.   Cardiovascular:      Rate and Rhythm: Normal rate and regular rhythm.      Pulses: Normal pulses.      Heart sounds: Normal heart sounds. No murmur heard.     No friction rub. No gallop.   Pulmonary:      Effort: Pulmonary effort is normal. No respiratory distress.      Breath sounds: Normal breath sounds. No wheezing, rhonchi or rales.   Abdominal:      General: Bowel sounds are normal. There is no distension.      Palpations: Abdomen is soft. There is no mass.      Tenderness: There is no abdominal tenderness.   Musculoskeletal:      Cervical back: Normal  range of motion and neck supple. No tenderness.   Lymphadenopathy:      Cervical: No cervical adenopathy.   Skin:     General: Skin is warm and dry.      Capillary Refill: Capillary refill takes less than 2 seconds.      Findings: No lesion.   Neurological:      General: No focal deficit present.      Mental Status: He is alert and oriented to person, place, and time. Mental status is at baseline.   Psychiatric:         Mood and Affect: Mood normal.         Behavior: Behavior normal.         Thought Content: Thought content normal.         Judgment: Judgment normal.        PHQ:      03/28/2023     2:55 PM   PHQ-9    Little interest or pleasure in doing things 0   Feeling down, depressed, or hopeless 0   PHQ-2 Score 0   PHQ-9 Total Score 0       Office Visit on 03/28/2023   Component Date Value Ref Range Status    Hemoglobin A1C, POC 03/28/2023 5.5  % Final   Hospital Outpatient Visit on 02/08/2023   Component Date Value Ref Range Status    PSA 02/08/2023 0.046  0.000 - 4.000 ng/mL Final    Comment: PSA INTERPRETATION:    At this time, no major scientific/medical organization including the American  Cancer Society and the American Urological Association have specific  recommendations in regard to prostate specific antigen (PSA) testing other  than recommending that men at age 47 and above and those who are at high risk  at age 102-45 and above be counseled in regard to the pros and cons of PSA  testing.    Guidelines for risk stratification    1.  Below 1:   Very low risk of prostate cancer  2.  1-4:        Approximately 15% risk of prostate cancer  3.  4-10:  25% risk of prostate cancer  4.  >10:        50% risk of prostate cancer    The use of PSA velocity (rate of rise of PSA over time) may also be useful in  predicting the risk of prostate cancer.  Most authorities recommend PSA  testing on at least three occasions over a period of 18 months to get an  accurate PSA velocity.    PSA measured by Roche [employer]  electrochemiluminescence immunoassay "ECLIA"  methodology.  PSA valu                           es determined on patient samples by different testing procedures  cannot be directly compared with one another and could be the cause of  erroneous medical interpretations.    References available by request.      Testosterone 02/08/2023 339  264 - 916 ng/dL Final    Comment: Adult male reference interval is based on a population of  healthy nonobese males (BMI <30) between 40 and 75 years  old. Travison, et.al. JCEM [phone]. PMID:  [id number].  **Verified by repeat analysis**      Testosterone, Free 02/08/2023 8.03  5.00 - 21.00 ng/dL Final    Testosterone % Free 02/08/2023 2.37  1.50 - 4.20 % Final    Comment: Performed At: [facility]  [address] [place],  [id number]  Jolene Schimke MD QI:[phone]      Sodium 02/08/2023 139  135 - 145 mmol/L Final    Potassium 02/08/2023 4.3  3.5 - 5.3 mmol/L Final    Chloride 02/08/2023 99  98 - 107 mmol/L Final    CO2 02/08/2023 27  22 - 29 mmol/L Final    Glucose 02/08/2023 137 (H)  70 - 99 mg/dL Final    BUN 32/44/0102 17  8 - 23 mg/dL Final    Creatinine 72/53/6644 0.9  0.7 - 1.3 mg/dL Final    Anion Gap 03/47/4259 13  2 - 17 mmol/L Final    Osmolaliy Calculated 02/08/2023 281  270 - 287 mOsm/kg Final    Calcium 02/08/2023 9.2  8.5 - 10.7 mg/dL Final    Total Protein 02/08/2023 6.4  5.7 - 8.3 g/dL Final    Albumin 56/38/7564 4.2  3.5 - 5.2 g/dL Final    Globulin 33/29/5188 2.0  1.9 - 4.4 g/dL Final    Albumin/Globulin Ratio 02/08/2023 2.00  1.00 - 2.70 Final    Total Bilirubin 02/08/2023 0.43  0.00 - 1.20 mg/dL Final    Alk Phosphatase 02/08/2023 64  40 - 130 unit/L Final    AST 02/08/2023 23  0 - 50 unit/L Final    ALT 02/08/2023 31  0 - 50 unit/L Final    Est, Glom Filt Rate 02/08/2023 90  >=60 mL/min/1.21m Final    Comment: VERIFIED by Discern Expert.  GFR Interpretation:                                                                         %  OF  KIDNEY  GFR  STAGE  FUNCTION  ==================================================================================    > 90        Normal kidney function                       STAGE 1  90-100%  89 to 60      Mild loss of kidney function                 STAGE 2  80-60%  59 to 45      Mild to moderate loss of kidney function     STAGE 3a  59-45%  44 to 30      Moderate to severe loss of kidney function   STAGE 3b  44-30%  29 to 15      Severe loss of kidney function               STAGE 4  29-15%    < 15        Kidney failure                               STAGE 5  <15%  ==================================================================================  Modified from National Kidney Foundation    GFR Calculation performed using the CKD-EPI 2021 equation developed for use  with IDMS traceable creatinine methods and                            is the calculation recommended by  the [facility] for estimating GFR in adults.      Hemoglobin 02/08/2023 16.6  13.0 - 17.3 g/dL Final    Hematocrit 54/07/8118 47.5  38.0 - 52.0 % Final        The 10-year ASCVD risk score (Arnett DK, et al., 2019) is: 21.4%    Values used to calculate the score:      Age: 27 years      Sex: Male      Is Non-Hispanic African American: No      Diabetic: No      Tobacco smoker: No      Systolic Blood Pressure: 132 mmHg      Is BP treated: No      HDL Cholesterol: 49 mg/dL      Total Cholesterol: 133 mg/dL     IMPRESSION/PLAN:  1. Mixed hyperlipidemia  Hyperlipidemia previously well-controlled on rosuvastatin.  No adverse effects reported.  Continue as prescribed.  Repeat lipid panel with next set of labs as drawn by specialists.  - rosuvastatin (CRESTOR) 20 MG tablet; Take 1 tablet by mouth daily  Dispense: 90 tablet; Refill: 2  - Lipid Panel; Future    2. Hyperglycemia  Hgb A1C drawn in the office today was within normal limits. Continue healthy diet and regular exercise. We will  continue to monitor.  - POC Glycated Hemoglobin (HBA1C) Test (83036)    3. Bilateral carotid artery stenosis  Mild bilateral carotid artery stenosis noted on [place] in 2023. We will continue to monitor intermittently.    4. Polycythemia  Patient has history of polycythemia. No need for acute intervention. We will monitor CBC with each set of labs.    5. History of malignant neoplasm of prostate  PSA WNL at most recent check. Patient to continue follow-up with Urology. No acute symptoms present. We will continue to follow peripherally in this regard.    6. Need for hepatitis B  screening test  - Hepatitis B Surface Antibody; Future        Oscar Horn, MD  I confirm that I have managed the broad scope of the patient's health needs by furnishing care for some or all the patient's acute and/or chronic conditions across a spectrum of diagnoses and organ systems that will require ongoing care with myself or someone on my team.    This note was typed and/or dictated by Dragon Naturally Speaking at the point-of-care.  Reasonable attempt at proofreading has been made, however, occasional wrong-word or `sound-a-like substitutions persist due to the inherent limitations of voice recognition software.  The intent is to have a complete and accurate medical record.  As a valued partner in this safety effort, if you have noted factual errors, please complete the Health Information Amendment/Correct Form or call the [facility] Health Information Management Office at [phone]. Please notify the Thereasa Parkin if any discrepancies are noted or if the meaning of any statement is unclear.

## 2023-04-07 ENCOUNTER — Encounter

## 2023-04-07 NOTE — Telephone Encounter (Signed)
Pt got a letter saying it was time for his lung CT scan. Z61.096 (ICD-10-CM) - Personal history of tobacco use. Please advise.

## 2023-04-10 ENCOUNTER — Other Ambulatory Visit: Payer: Medicare Other

## 2023-04-18 ENCOUNTER — Other Ambulatory Visit: Payer: Self-pay | Admitting: Internal Medicine

## 2023-04-18 ENCOUNTER — Ambulatory Visit
Admission: RE | Admit: 2023-04-18 | Discharge: 2023-04-18 | Disposition: A | Discharge status: Home / Self Care | Payer: No Typology Code available for payment source | Source: Ambulatory Visit | Attending: Internal Medicine | Admitting: Internal Medicine

## 2023-04-18 DIAGNOSIS — E785 Hyperlipidemia, unspecified: Secondary | ICD-10-CM

## 2023-04-18 DIAGNOSIS — R931 Abnormal findings on diagnostic imaging of heart and coronary circulation: Secondary | ICD-10-CM | POA: Insufficient documentation

## 2023-04-18 MED ORDER — ROSUVASTATIN CALCIUM 20 MG PO TABS
20.0000 mg | ORAL_TABLET | Freq: Every day | ORAL | 0 refills | Status: DC
Start: 2023-04-18 — End: 2023-07-04

## 2023-04-18 MED ORDER — ASPIRIN 81 MG PO TBEC
81.0000 mg | DELAYED_RELEASE_TABLET | Freq: Every day | ORAL | 0 refills | Status: DC
Start: 2023-04-18 — End: 2024-02-27

## 2023-05-02 ENCOUNTER — Encounter: Payer: Self-pay | Admitting: Family Medicine

## 2023-05-04 ENCOUNTER — Other Ambulatory Visit: Payer: Self-pay | Admitting: Internal Medicine

## 2023-05-04 DIAGNOSIS — E785 Hyperlipidemia, unspecified: Secondary | ICD-10-CM

## 2023-05-04 DIAGNOSIS — E039 Hypothyroidism, unspecified: Secondary | ICD-10-CM

## 2023-05-04 DIAGNOSIS — Z21 Asymptomatic human immunodeficiency virus [HIV] infection status: Secondary | ICD-10-CM

## 2023-05-04 NOTE — Progress Notes (Unsigned)
Lab Results  Component Value Date   WBC 5.4 11/01/2022   HGB 14.9 11/01/2022   HCT 43.0 11/01/2022   PLT 204.0 11/01/2022   GLUCOSE 95 02/27/2023   CHOL 223 (H) 02/27/2023   TRIG 89.0 02/27/2023   HDL 60.10 02/27/2023   LDLCALC 145 (H) 02/27/2023   ALT 19 11/01/2022   AST 21 11/01/2022   NA 138 02/27/2023   K 4.1 02/27/2023   CL 105 02/27/2023   CREATININE 0.91 02/27/2023   BUN 15 02/27/2023   CO2 26 02/27/2023   TSH 0.31 (L) 02/27/2023   PSA 4.18 (H) 02/27/2023   INR 1.0 02/12/2007   HGBA1C 5.8 02/27/2023

## 2023-05-08 ENCOUNTER — Other Ambulatory Visit (INDEPENDENT_AMBULATORY_CARE_PROVIDER_SITE_OTHER): Payer: Medicare Other

## 2023-05-08 DIAGNOSIS — E039 Hypothyroidism, unspecified: Secondary | ICD-10-CM

## 2023-05-08 DIAGNOSIS — E785 Hyperlipidemia, unspecified: Secondary | ICD-10-CM | POA: Diagnosis not present

## 2023-05-08 DIAGNOSIS — Z21 Asymptomatic human immunodeficiency virus [HIV] infection status: Secondary | ICD-10-CM

## 2023-05-08 LAB — CBC WITH DIFFERENTIAL/PLATELET
Basophils Absolute: 0 10*3/uL (ref 0.0–0.1)
Basophils Relative: 1.1 % (ref 0.0–3.0)
Eosinophils Absolute: 0.1 10*3/uL (ref 0.0–0.7)
Eosinophils Relative: 3 % (ref 0.0–5.0)
HCT: 43.1 % (ref 39.0–52.0)
Hemoglobin: 14.3 g/dL (ref 13.0–17.0)
Lymphocytes Relative: 33.6 % (ref 12.0–46.0)
Lymphs Abs: 1.4 10*3/uL (ref 0.7–4.0)
MCHC: 33.2 g/dL (ref 30.0–36.0)
MCV: 97 fl (ref 78.0–100.0)
Monocytes Absolute: 0.5 10*3/uL (ref 0.1–1.0)
Monocytes Relative: 13.2 % — ABNORMAL HIGH (ref 3.0–12.0)
Neutro Abs: 2 10*3/uL (ref 1.4–7.7)
Neutrophils Relative %: 49.1 % (ref 43.0–77.0)
Platelets: 156 10*3/uL (ref 150.0–400.0)
RBC: 4.44 Mil/uL (ref 4.22–5.81)
RDW: 13.4 % (ref 11.5–15.5)
WBC: 4.1 10*3/uL (ref 4.0–10.5)

## 2023-05-08 LAB — HEPATIC FUNCTION PANEL
ALT: 23 U/L (ref 0–53)
AST: 23 U/L (ref 0–37)
Albumin: 4.2 g/dL (ref 3.5–5.2)
Alkaline Phosphatase: 51 U/L (ref 39–117)
Bilirubin, Direct: 0.2 mg/dL (ref 0.0–0.3)
Total Bilirubin: 0.8 mg/dL (ref 0.2–1.2)
Total Protein: 7 g/dL (ref 6.0–8.3)

## 2023-05-08 LAB — TSH: TSH: 2.21 u[IU]/mL (ref 0.35–5.50)

## 2023-05-10 ENCOUNTER — Ambulatory Visit: Payer: MEDICARE | Primary: Family Medicine

## 2023-05-10 ENCOUNTER — Other Ambulatory Visit: Payer: MEDICARE | Primary: Family Medicine

## 2023-05-10 LAB — T-HELPER CELLS (CD4) COUNT (NOT AT ARMC)
Absolute CD4: 577 cells/uL (ref 490–1740)
CD4 T Helper %: 41 % (ref 30–61)
Total lymphocyte count: 1398 cells/uL (ref 850–3900)

## 2023-05-10 LAB — HIV-1 RNA QUANT-NO REFLEX-BLD
HIV 1 RNA Quant: 346 Copies/mL — ABNORMAL HIGH
HIV-1 RNA Quant, Log: 2.54 Log cps/mL — ABNORMAL HIGH

## 2023-05-12 ENCOUNTER — Encounter

## 2023-05-12 ENCOUNTER — Inpatient Hospital Stay: Admit: 2023-05-12 | Payer: MEDICARE | Primary: Family Medicine

## 2023-05-12 DIAGNOSIS — B349 Viral infection, unspecified: Secondary | ICD-10-CM | POA: Diagnosis not present

## 2023-05-12 DIAGNOSIS — B20 Human immunodeficiency virus [HIV] disease: Secondary | ICD-10-CM | POA: Diagnosis not present

## 2023-05-12 DIAGNOSIS — Z1633 Resistance to antiviral drug(s): Secondary | ICD-10-CM | POA: Diagnosis not present

## 2023-05-12 DIAGNOSIS — Z1159 Encounter for screening for other viral diseases: Secondary | ICD-10-CM

## 2023-05-12 LAB — LIPID PANEL
Chol/HDL Ratio: 2.4 (ref 0.0–4.4)
Cholesterol, Total: 119 mg/dL (ref 100–200)
HDL: 50 mg/dL (ref >=40)
LDL Cholesterol: 61 mg/dL (ref 0.0–100.0)
LDL/HDL Ratio: 1
Triglycerides: 44 mg/dL (ref 0–149)
VLDL: 8.8 mg/dL (ref 5.0–40.0)

## 2023-05-12 LAB — PSA, DIAGNOSTIC: PSA: 0.053 ng/mL (ref 0.000–4.000)

## 2023-05-12 LAB — TESTOSTERONE: Testosterone: 878 ng/dL — ABNORMAL HIGH (ref 193.0–740.0)

## 2023-05-13 LAB — HEPATITIS B SURFACE ANTIBODY
Hep B S Ab Interp: NON-IMMUNE/NOT IMMUNE
Hep B S Ab: <3.5 — ABNORMAL LOW (ref 11.5–999.0)

## 2023-05-16 ENCOUNTER — Ambulatory Visit (INDEPENDENT_AMBULATORY_CARE_PROVIDER_SITE_OTHER): Payer: Medicare Other

## 2023-05-16 VITALS — Ht 74.0 in | Wt 193.6 lb

## 2023-05-16 DIAGNOSIS — Z0000 Encounter for general adult medical examination without abnormal findings: Secondary | ICD-10-CM

## 2023-05-16 NOTE — Progress Notes (Signed)
Subjective:   Zachary Levy is a 77 y.o. male who presents for Medicare Annual/Subsequent preventive examination.  Visit Complete: Virtual  I connected with  Zachary Levy on 05/16/23 by a audio enabled telemedicine application and verified that I am speaking with the correct person using two identifiers.  Patient Location: Home  Provider Location: Office/Clinic  I discussed the limitations of evaluation and management by telemedicine. The patient expressed understanding and agreed to proceed.  Patient Medicare AWV questionnaire was completed by the patient on 05/15/2023; I have confirmed that all information answered by patient is correct and no changes since this date.  Review of Systems     Cardiac Risk Factors include: advanced age (>30men, >47 women);male gender     Objective:    Today's Vitals   05/16/23 0848 05/16/23 0849  Weight: 193 lb 9.6 oz (87.8 kg)   Height: 6\' 2"  (1.88 m)   PainSc: 0-No pain 0-No pain   Body mass index is 24.86 kg/m.     05/16/2023    8:51 AM 05/10/2022    8:42 AM 02/08/2021    8:51 AM 08/13/2018    3:36 PM 07/07/2017   10:27 AM  Advanced Directives  Does Patient Have a Medical Advance Directive? Yes Yes Yes Yes Yes  Type of Estate agent of [place];Living will Living will Living will Healthcare Power of [place];Living will Healthcare Power of [place];Living will  Does patient want to make changes to medical advance directive?   No - Patient declined    Copy of Healthcare Power of Attorney in Chart? No - copy requested  No - copy requested No - copy requested No - copy requested    Current Medications (verified) Outpatient Encounter Medications as of 05/16/2023  Medication Sig   aspirin EC 81 MG tablet Take 1 tablet (81 mg total) by mouth daily. Swallow whole.   bictegravir-emtricitabine-tenofovir AF (BIKTARVY) 50-200-25 MG TABS tablet Take 1 tablet by mouth daily.   levothyroxine (SYNTHROID) 100 MCG tablet Take 1  tablet (100 mcg total) by mouth daily.   zolpidem (AMBIEN) 5 MG tablet Take 5 mg by mouth as needed for sleep.   rosuvastatin (CRESTOR) 20 MG tablet Take 1 tablet (20 mg total) by mouth daily.   No facility-administered encounter medications on file as of 05/16/2023.    Allergies (verified) Patient has no known allergies.   History: Past Medical History:  Diagnosis Date   HIV infection (HCC)    Hypothyroid    History reviewed. No pertinent surgical history. Family History  Problem Relation Age of Onset   Cancer Sister    Cancer Brother    Social History   Socioeconomic History   Marital status: Divorced    Spouse name: Not on file   Number of children: Not on file   Years of education: Not on file   Highest education level: Not on file  Occupational History   Not on file  Tobacco Use   Smoking status: Never   Smokeless tobacco: Never  Vaping Use   Vaping Use: Never used  Substance and Sexual Activity   Alcohol use: Yes    Alcohol/week: 1.0 standard drink of alcohol    Types: 1 Cans of beer per week    Comment: 1-2 glass wine few times weekly   Drug use: No   Sexual activity: Not Currently    Partners: Male  Other Topics Concern   Not on file  Social History Narrative   Lives with his husband  Social Determinants of Health   Financial Resource Strain: Low Risk  (05/16/2023)   Overall Financial Resource Strain (CARDIA)    Difficulty of Paying Living Expenses: Not hard at all  Food Insecurity: No Food Insecurity (05/16/2023)   Hunger Vital Sign    Worried About Running Out of Food in the Last Year: Never true    Ran Out of Food in the Last Year: Never true  Transportation Needs: No Transportation Needs (05/16/2023)   PRAPARE - Administrator, Civil Service (Medical): No    Lack of Transportation (Non-Medical): No  Physical Activity: Sufficiently Active (05/16/2023)   Exercise Vital Sign    Days of Exercise per Week: 6 days    Minutes of Exercise  per Session: 120 min  Stress: No Stress Concern Present (05/16/2023)   [employer] of Occupational Health - Occupational Stress Questionnaire    Feeling of Stress : Not at all  Social Connections: Unknown (05/16/2023)   Social Connection and Isolation Panel [NHANES]    Frequency of Communication with Friends and Family: More than three times a week    Frequency of Social Gatherings with Friends and Family: Twice a week    Attends Religious Services: Not on Marketing executive or Organizations: Yes    Attends Engineer, structural: More than 4 times per year    Marital Status: Patient declined    Tobacco Counseling Counseling given: Not Answered   Clinical Intake:  Pre-visit preparation completed: Yes  Pain : No/denies pain Pain Score: 0-No pain     BMI - recorded: 24.86 Nutritional Status: BMI of 19-24  Normal Nutritional Risks: None Diabetes: No  How often do you need to have someone help you when you read instructions, pamphlets, or other written materials from your doctor or pharmacy?: 1 - Never What is the last grade level you completed in school?: Master's Degree  Interpreter Needed?: No  Information entered by :: Doyne Ellinger N. Cornisha Zetino, LPN.   Activities of Daily Living    05/16/2023    9:00 AM 05/15/2023   10:09 PM  In your present state of health, do you have any difficulty performing the following activities:  Hearing? 0 0  Vision? 1 1  Difficulty concentrating or making decisions? 0 0  Walking or climbing stairs? 0 0  Dressing or bathing? 0 0  Doing errands, shopping? 0 0  Preparing Food and eating ? N N  Using the Toilet? N N  In the past six months, have you accidently leaked urine? N N  Do you have problems with loss of bowel control? N N  Managing your Medications? N N  Managing your Finances? N N  Housekeeping or managing your Housekeeping? N N    Patient Care Team: Etta Grandchild, MD as PCP - General (Internal  Medicine) Elise Benne, MD as Consulting Physician (Ophthalmology)  Indicate any recent Medical Services you may have received from other than [facility] providers in the past year (date may be approximate).     Assessment:   This is a routine wellness examination for Zachary Levy.  Hearing/Vision screen Hearing Screening - Comments:: Patient wears hearing aids. Vision Screening - Comments:: Patient wears otc readers.  Eye exam done by Elise Benne, MD.  Dietary issues and exercise activities discussed:     Goals Addressed             This Visit's Progress    My healthcare goal for 2024 is to  climb the highest peak in the [place] which is in [place].        Depression Screen    05/16/2023    8:52 AM 03/06/2023    8:18 AM 05/26/2022    8:38 AM 05/10/2022    8:45 AM 03/07/2022   11:03 AM 02/08/2021    8:50 AM 11/03/2020    8:56 AM  PHQ 2/9 Scores  PHQ - 2 Score 0 0 0 0 0 0 0  PHQ- 9 Score 0          Fall Risk    05/16/2023    9:00 AM 05/15/2023   10:09 PM 03/06/2023    8:18 AM 05/10/2022    8:35 AM 03/07/2022   11:02 AM  Fall Risk   Falls in the past year? 0 0 0 1 0  Number falls in past yr: 0 0 0 0   Injury with Fall? 0 0 0 0   Risk for fall due to : No Fall Risks  No Fall Risks    Follow up Falls prevention discussed  Falls evaluation completed Falls evaluation completed;Education provided;Falls prevention discussed     MEDICARE RISK AT HOME:  Medicare Risk at Home - 05/16/23 0901     Any stairs in or around the home? Yes    If so, are there any without handrails? Yes    Home free of loose throw rugs in walkways, pet beds, electrical cords, etc? No    Adequate lighting in your home to reduce risk of falls? Yes    Life alert? No    Use of a cane, walker or w/c? No    Grab bars in the bathroom? No    Shower chair or bench in shower? No    Elevated toilet seat or a handicapped toilet? No             TIMED UP AND GO:  Was the test performed?  No  Telephonic  Visit  Cognitive Function:        05/16/2023    9:00 AM 05/10/2022    8:37 AM  6CIT Screen  What Year? 0 points 0 points  What month? 0 points 0 points  What time? 0 points   Count back from 20 0 points 0 points  Months in reverse 0 points 0 points  Repeat phrase 0 points 0 points  Total Score 0 points     Immunizations Immunization History  Administered Date(s) Administered   Hep A / Hep B 08/17/2007, 12/13/2007, 03/13/2008   Hepatitis B 11/21/1996, 12/22/1996, 01/19/1997   Influenza Whole 10/23/2006   Influenza, High Dose Seasonal PF 09/05/2017, 08/13/2018, 08/20/2019   Influenza, Seasonal, Injecte, Preservative Fre 07/31/2008, 08/25/2011   Influenza,inj,Quad PF,6+ Mos 08/22/2013, 10/08/2015   Influenza-Unspecified 09/05/2016, 08/04/2021, 08/20/2022   Moderna SARS-COV2 Booster Vaccination 02/27/2023   PFIZER(Purple Top)SARS-COV-2 Vaccination 12/12/2019, 01/02/2020, 08/16/2020   Pneumococcal Conjugate-13 03/13/2014   Pneumococcal Polysaccharide-23 12/23/1998, 10/08/2015, 12/13/2021   Pneumococcal-Unspecified 08/16/2007   Td 05/18/2017   Td (Adult), 2 Lf Tetanus Toxid, Preservative Free 05/18/2017   Tdap 05/18/2017   Zoster Recombinat (Shingrix) 05/18/2017, 12/19/2017    TDAP status: Up to date  Flu Vaccine status: Up to date  Pneumococcal vaccine status: Up to date  Covid-19 vaccine status: Completed vaccines  Qualifies for Shingles Vaccine? Yes   Zostavax completed Yes   Shingrix Completed?: Yes  Screening Tests Health Maintenance  Topic Date Due   COVID-19 Vaccine (4 - 2023-24 season) 04/24/2023   INFLUENZA VACCINE  06/22/2023   Medicare Annual Wellness (AWV)  05/15/2024   Colonoscopy  12/16/2024   DTaP/Tdap/Td (3 - Td or Tdap) 05/19/2027   Pneumonia Vaccine 48+ Years old  Completed   Hepatitis C Screening  Completed   Zoster Vaccines- Shingrix  Completed   HPV VACCINES  Aged Out    Health Maintenance  Health Maintenance Due  Topic Date Due    COVID-19 Vaccine (4 - 2023-24 season) 04/24/2023    Colorectal cancer screening: Type of screening: Colonoscopy. Completed 02/08/2022. Repeat every 10 years  Lung Cancer Screening: (Low Dose CT Chest recommended if Age 37-80 years, 20 pack-year currently smoking OR have quit w/in 15years.) does not qualify.   Lung Cancer Screening Referral: no  Additional Screening:  Hepatitis C Screening: does qualify; Completed 03/12/2021  Vision Screening: Recommended annual ophthalmology exams for early detection of glaucoma and other disorders of the eye. Is the patient up to date with their annual eye exam?  Yes  Who is the provider or what is the name of the office in which the patient attends annual eye exams? Sigmund Gould,MD. If pt is not established with a provider, would they like to be referred to a provider to establish care? No .   Dental Screening: Recommended annual dental exams for proper oral hygiene  Diabetic Foot Exam: N/A  Community Resource Referral / Chronic Care Management: CRR required this visit?  No   CCM required this visit?  No     Plan:     I have personally reviewed and noted the following in the patient's chart:   Medical and social history Use of alcohol, tobacco or illicit drugs  Current medications and supplements including opioid prescriptions. Patient is not currently taking opioid prescriptions. Functional ability and status Nutritional status Physical activity Advanced directives List of other physicians Hospitalizations, surgeries, and ER visits in previous 12 months Vitals Screenings to include cognitive, depression, and falls Referrals and appointments  In addition, I have reviewed and discussed with patient certain preventive protocols, quality metrics, and best practice recommendations. A written personalized care plan for preventive services as well as general preventive health recommendations were provided to patient.     Mickeal Needy, LPN   02/27/8118   After Visit Summary: (Mail) Due to this being a telephonic visit, the after visit summary with patients personalized plan was offered to patient via mail   Nurse Notes: Normal cognitive status assessed by direct observation via telephone conversation by this Nurse Health Advisor. No abnormalities found.

## 2023-05-16 NOTE — Patient Instructions (Addendum)
Zachary Levy , Thank you for taking time to come for your Medicare Wellness Visit. I appreciate your ongoing commitment to your health goals. Please review the following plan we discussed and let me know if I can assist you in the future.   These are the goals we discussed:  Goals      My healthcare goal for 2024 is to climb the highest peak in the [place] which is in [place].        This is a list of the screening recommended for you and due dates:  Health Maintenance  Topic Date Due   COVID-19 Vaccine (4 - 2023-24 season) 04/24/2023   Flu Shot  06/22/2023   Medicare Annual Wellness Visit  05/15/2024   Colon Cancer Screening  12/16/2024   DTaP/Tdap/Td vaccine (3 - Td or Tdap) 05/19/2027   Pneumonia Vaccine  Completed   Hepatitis C Screening  Completed   Zoster (Shingles) Vaccine  Completed   HPV Vaccine  Aged Out    Advanced directives: Yes  Conditions/risks identified: Yes  Next appointment: It was nice speaking with you today!  Please follow up in one year for your annual wellness visit via telephone call with Nurse Percell Miller on 05/21/2024 at 8:45 a.m.  If you need to cancel or reschedule please call [phone].  Preventive Care 75 Years and Older, Male  Preventive care refers to lifestyle choices and visits with your health care provider that can promote health and wellness. What does preventive care include? A yearly physical exam. This is also called an annual well check. Dental exams once or twice a year. Routine eye exams. Ask your health care provider how often you should have your eyes checked. Personal lifestyle choices, including: Daily care of your teeth and gums. Regular physical activity. Eating a healthy diet. Avoiding tobacco and drug use. Limiting alcohol use. Practicing safe sex. Taking low doses of aspirin every day. Taking vitamin and mineral supplements as recommended by your health care provider. What happens during an annual well check? The services  and screenings done by your health care provider during your annual well check will depend on your age, overall health, lifestyle risk factors, and family history of disease. Counseling  Your health care provider may ask you questions about your: Alcohol use. Tobacco use. Drug use. Emotional well-being. Home and relationship well-being. Sexual activity. Eating habits. History of falls. Memory and ability to understand (cognition). Work and work Astronomer. Screening  You may have the following tests or measurements: Height, weight, and BMI. Blood pressure. Lipid and cholesterol levels. These may be checked every 5 years, or more frequently if you are over 75 years old. Skin check. Lung cancer screening. You may have this screening every year starting at age 75 if you have a 30-pack-year history of smoking and currently smoke or have quit within the past 15 years. Fecal occult blood test (FOBT) of the stool. You may have this test every year starting at age 75. Flexible sigmoidoscopy or colonoscopy. You may have a sigmoidoscopy every 5 years or a colonoscopy every 10 years starting at age 75. Prostate cancer screening. Recommendations will vary depending on your family history and other risks. Hepatitis C blood test. Hepatitis B blood test. Sexually transmitted disease (STD) testing. Diabetes screening. This is done by checking your blood sugar (glucose) after you have not eaten for a while (fasting). You may have this done every 1-3 years. Abdominal aortic aneurysm (AAA) screening. You may need this if you are  a current or former smoker. Osteoporosis. You may be screened starting at age 75 if you are at high risk. Talk with your health care provider about your test results, treatment options, and if necessary, the need for more tests. Vaccines  Your health care provider may recommend certain vaccines, such as: Influenza vaccine. This is recommended every year. Tetanus, diphtheria,  and acellular pertussis (Tdap, Td) vaccine. You may need a Td booster every 10 years. Zoster vaccine. You may need this after age 75. Pneumococcal 13-valent conjugate (PCV13) vaccine. One dose is recommended after age 75. Pneumococcal polysaccharide (PPSV23) vaccine. One dose is recommended after age 75. Talk to your health care provider about which screenings and vaccines you need and how often you need them. This information is not intended to replace advice given to you by your health care provider. Make sure you discuss any questions you have with your health care provider. Document Released: 12/04/2015 Document Revised: 07/27/2016 Document Reviewed: 09/08/2015 Elsevier Interactive Patient Education  2017 [employer] Prevention in the Home Falls can cause injuries. They can happen to people of all ages. There are many things you can do to make your home safe and to help prevent falls. What can I do on the outside of my home? Regularly fix the edges of walkways and driveways and fix any cracks. Remove anything that might make you trip as you walk through a door, such as a raised step or threshold. Trim any bushes or trees on the path to your home. Use bright outdoor lighting. Clear any walking paths of anything that might make someone trip, such as rocks or tools. Regularly check to see if handrails are loose or broken. Make sure that both sides of any steps have handrails. Any raised decks and porches should have guardrails on the edges. Have any leaves, snow, or ice cleared regularly. Use sand or salt on walking paths during winter. Clean up any spills in your garage right away. This includes oil or grease spills. What can I do in the bathroom? Use night lights. Install grab bars by the toilet and in the tub and shower. Do not use towel bars as grab bars. Use non-skid mats or decals in the tub or shower. If you need to sit down in the shower, use a plastic, non-slip  stool. Keep the floor dry. Clean up any water that spills on the floor as soon as it happens. Remove soap buildup in the tub or shower regularly. Attach bath mats securely with double-sided non-slip rug tape. Do not have throw rugs and other things on the floor that can make you trip. What can I do in the bedroom? Use night lights. Make sure that you have a light by your bed that is easy to reach. Do not use any sheets or blankets that are too big for your bed. They should not hang down onto the floor. Have a firm chair that has side arms. You can use this for support while you get dressed. Do not have throw rugs and other things on the floor that can make you trip. What can I do in the kitchen? Clean up any spills right away. Avoid walking on wet floors. Keep items that you use a lot in easy-to-reach places. If you need to reach something above you, use a strong step stool that has a grab bar. Keep electrical cords out of the way. Do not use floor polish or wax that makes floors slippery. If you must  use wax, use non-skid floor wax. Do not have throw rugs and other things on the floor that can make you trip. What can I do with my stairs? Do not leave any items on the stairs. Make sure that there are handrails on both sides of the stairs and use them. Fix handrails that are broken or loose. Make sure that handrails are as long as the stairways. Check any carpeting to make sure that it is firmly attached to the stairs. Fix any carpet that is loose or worn. Avoid having throw rugs at the top or bottom of the stairs. If you do have throw rugs, attach them to the floor with carpet tape. Make sure that you have a light switch at the top of the stairs and the bottom of the stairs. If you do not have them, ask someone to add them for you. What else can I do to help prevent falls? Wear shoes that: Do not have high heels. Have rubber bottoms. Are comfortable and fit you well. Are closed at the  toe. Do not wear sandals. If you use a stepladder: Make sure that it is fully opened. Do not climb a closed stepladder. Make sure that both sides of the stepladder are locked into place. Ask someone to hold it for you, if possible. Clearly mark and make sure that you can see: Any grab bars or handrails. First and last steps. Where the edge of each step is. Use tools that help you move around (mobility aids) if they are needed. These include: Canes. Walkers. Scooters. Crutches. Turn on the lights when you go into a dark area. Replace any light bulbs as soon as they burn out. Set up your furniture so you have a clear path. Avoid moving your furniture around. If any of your floors are uneven, fix them. If there are any pets around you, be aware of where they are. Review your medicines with your doctor. Some medicines can make you feel dizzy. This can increase your chance of falling. Ask your doctor what other things that you can do to help prevent falls. This information is not intended to replace advice given to you by your health care provider. Make sure you discuss any questions you have with your health care provider. Document Released: 09/03/2009 Document Revised: 04/14/2016 Document Reviewed: 12/12/2014 Elsevier Interactive Patient Education  2017 [employer].

## 2023-05-18 DIAGNOSIS — Z1151 Encounter for screening for human papillomavirus (HPV): Secondary | ICD-10-CM | POA: Diagnosis not present

## 2023-05-18 DIAGNOSIS — R85610 Atypical squamous cells of undetermined significance on cytologic smear of anus (ASC-US): Secondary | ICD-10-CM | POA: Diagnosis not present

## 2023-05-18 DIAGNOSIS — Z113 Encounter for screening for infections with a predominantly sexual mode of transmission: Secondary | ICD-10-CM | POA: Diagnosis not present

## 2023-05-18 DIAGNOSIS — B20 Human immunodeficiency virus [HIV] disease: Secondary | ICD-10-CM | POA: Diagnosis not present

## 2023-05-18 DIAGNOSIS — E039 Hypothyroidism, unspecified: Secondary | ICD-10-CM | POA: Diagnosis not present

## 2023-05-18 DIAGNOSIS — R85611 Atypical squamous cells cannot exclude high grade squamous intraepithelial lesion on cytologic smear of anus (ASC-H): Secondary | ICD-10-CM | POA: Diagnosis not present

## 2023-05-18 DIAGNOSIS — E291 Testicular hypofunction: Secondary | ICD-10-CM | POA: Diagnosis not present

## 2023-05-18 DIAGNOSIS — Z8619 Personal history of other infectious and parasitic diseases: Secondary | ICD-10-CM | POA: Diagnosis not present

## 2023-05-18 DIAGNOSIS — Z1212 Encounter for screening for malignant neoplasm of rectum: Secondary | ICD-10-CM | POA: Diagnosis not present

## 2023-05-21 ENCOUNTER — Ambulatory Visit: Payer: MEDICARE | Primary: Family Medicine

## 2023-05-24 ENCOUNTER — Ambulatory Visit: Payer: MEDICARE | Primary: Family Medicine

## 2023-05-24 DIAGNOSIS — Z87891 Personal history of nicotine dependence: Secondary | ICD-10-CM

## 2023-05-29 ENCOUNTER — Other Ambulatory Visit: Payer: Self-pay | Admitting: Internal Medicine

## 2023-05-29 DIAGNOSIS — N401 Enlarged prostate with lower urinary tract symptoms: Secondary | ICD-10-CM | POA: Diagnosis not present

## 2023-05-29 DIAGNOSIS — R972 Elevated prostate specific antigen [PSA]: Secondary | ICD-10-CM | POA: Diagnosis not present

## 2023-05-29 DIAGNOSIS — E039 Hypothyroidism, unspecified: Secondary | ICD-10-CM

## 2023-05-29 DIAGNOSIS — R351 Nocturia: Secondary | ICD-10-CM | POA: Diagnosis not present

## 2023-06-02 ENCOUNTER — Ambulatory Visit: Admit: 2023-06-02 | Discharge: 2023-06-02 | Payer: MEDICARE | Primary: Family Medicine

## 2023-06-02 ENCOUNTER — Ambulatory Visit: Payer: MEDICARE | Primary: Family Medicine

## 2023-06-02 DIAGNOSIS — M25551 Pain in right hip: Secondary | ICD-10-CM

## 2023-06-02 NOTE — Progress Notes (Signed)
OFFICE NOTE:   CHIEF COMPLAINT:    Chief Complaint   Patient presents with    Hip Pain     Right Hip pain and weakness      HISTORY: Oscar Turner is a [place] speaking patient that presents today with right hip pain, who is a 77 y.o. male.  Patient was last seen by [place] on 05/18/2020 for his right shoulder.  He comes in today with a new complaint of nontraumatic right hip pain since early July 2024.  He locates the pain to the groin region with intermittent radiculopathy down his leg.  States the pain is aggravated with prolonged sitting, golf and ADLs.  He denies any previous physical therapy, injections or surgeries to the right hip in the past.  Of note, he is currently being treated by Dr. Marletta Lor for a fracture of his L4-L5 spine for the past several years.  He reports receiving injections to his low back with reported significant relief of symptoms for several months.       Of note, he is status post right shoulder rotator cuff repair for a 4 x 3 cm tear, subscapularis debridement, proximal biceps tenodesis, labral debridement  and subacromial decompression performed on 10/11/2018 by Dr. Noralyn Pick.  He is also status post a left shoulder arthroscopic 3  x 2 cm supraspinatus and infraspinatus repair, subscapularis repair, subacromial decompression and labral debridement performed on 05/11/2017.  Past Medical History:   Diagnosis Date    Complete rotator cuff tear of left shoulder 06/04/2021    S/p repair.    Compression fracture of lumbosacral spine (HCC) 06/04/2021    HLD (hyperlipidemia)     Low testosterone     Melanoma in situ (HCC)     Chest. S/P excision.    Nontraumatic complete tear of right rotator cuff 06/04/2021    S/p repair.    Prostate cancer [facility])     Skin cancer of face     s/p excision. Dr. Sunny Schlein      Past Surgical History:   Procedure Laterality Date    [place]    OTHER SURGICAL HISTORY  08/2014    Compression fracture of L4    PROSTATECTOMY  02/2011    radical    ROTATOR CUFF  REPAIR Right 10/11/2018    Dr. Noralyn Pick    SHOULDER ARTHROSCOPY W/ ROTATOR CUFF REPAIR Left 05/11/2017    Dr. Collene Mares    TONSILLECTOMY      WISDOM TOOTH EXTRACTION        The patient's allergies, current medications, past family history, past medical history, past social history, past surgical history and problem list were reviewed and updated as appropriate.        Review of Systems - All systems reviewed and are negative except right knee pain       PHYSICAL EXAM:   There were no vitals filed for this visit.   General Appearance:  Appears Stated Age, Well-Nourished; Oriented x 3; Gait: Ambulating with a limp; Eyes are clear, pupils symmetric and reactive to light; Skin: no lesions, no erythema; Neck: no thyroid enlargement, no jugular venous distension; psych - appropriate       Right hip:  ROM: flexion --115, internal rotation at 90 --50, external rotation at 90 --15.  No significant pain with FADIR and labral stress test.  Negative FABER.  No greater trochanteric tenderness.      Motor Strength- 5/5 throughout each lower extremity  Sensory- intact to light touch bilateral     Vascular-2+ Dorsalis Pedis Pulse bilateral      Motor Strength- 5/5 throughout each lower extremity   Sensation- intact to light touch bilateral    IMAGING:   X-Ray--Right Hip--AP Pelvis, Lateral- 06/02/2023 :I personally reviewed the imaging and it demonstrated: Mild joint space narrowing with diffuse osteophyte formation.  Significant degenerative change with scoliosis of the lumbar spine noted.        IMPRESSION:      Right Knee-    Diagnosis Orders   1. Right hip pain  XR HIP RIGHT (2-3 VIEWS) (CLINIC PERFORMED)             PLAN: Due to the patient's history, physical exam and imaging, I believe that the patient's right hip pain is referred from his lumbar spine as his history and physical exam is unremarkable for right hip pathology.  We did offer physical therapy and conservative management for his right hip which the  patient has declined today.  He will follow-up with Dr. Marletta Lor for further evaluation of his lumbar spine.  We discussed the possible MRI in the future if he would like to be more aggressive which the patient has verbalized understanding.  At this time we will continue to observe his right hip and he will follow-up with [place] as needed.    No follow-ups on file.   Cc: Hurman Horn, MD       Seen and evaluated by Martina Sinner, PA-C and reviewed by Dr. Noralyn Pick, MD, dictated by Martina Sinner, PA-C.      Portions of this note were dictated using Animal nutritionist. It has been reviewed for accuracy, but may contain grammatical and clerical errors. If there are any questions please feel free to contact me at the office directly.

## 2023-06-21 ENCOUNTER — Encounter (INDEPENDENT_AMBULATORY_CARE_PROVIDER_SITE_OTHER): Payer: Self-pay

## 2023-06-26 ENCOUNTER — Other Ambulatory Visit: Payer: Self-pay | Admitting: Oncology

## 2023-06-26 DIAGNOSIS — Z006 Encounter for examination for normal comparison and control in clinical research program: Secondary | ICD-10-CM

## 2023-06-28 ENCOUNTER — Encounter: Payer: MEDICARE | Attending: Orthopaedic Surgery | Primary: Family Medicine

## 2023-06-28 ENCOUNTER — Telehealth: Payer: Self-pay | Admitting: Internal Medicine

## 2023-06-28 NOTE — Telephone Encounter (Signed)
Patient is requesting that labs be ordered before his appointment on 07/04/2023.  Please call patient and let him know when this is done. [phone]  Also wants to have his gene test completed at the same time.

## 2023-07-04 ENCOUNTER — Ambulatory Visit (INDEPENDENT_AMBULATORY_CARE_PROVIDER_SITE_OTHER): Payer: Medicare Other | Admitting: Internal Medicine

## 2023-07-04 VITALS — BP 122/74 | HR 62 | Temp 97.9°F | Ht 74.0 in | Wt 201.0 lb

## 2023-07-04 DIAGNOSIS — E785 Hyperlipidemia, unspecified: Secondary | ICD-10-CM

## 2023-07-04 DIAGNOSIS — E039 Hypothyroidism, unspecified: Secondary | ICD-10-CM | POA: Diagnosis not present

## 2023-07-04 NOTE — Patient Instructions (Signed)

## 2023-07-04 NOTE — Progress Notes (Unsigned)
Subjective:  Patient ID: Zachary Levy, male    DOB: [birth year]  Age: 77 y.o. MRN: [id number]  CC: Hypothyroidism and Hyperlipidemia   HPI Terryl Tubb presents for f/up ----   Outpatient Medications Prior to Visit  Medication Sig Dispense Refill   aspirin EC 81 MG tablet Take 1 tablet (81 mg total) by mouth daily. Swallow whole. 90 tablet 0   bictegravir-emtricitabine-tenofovir AF (BIKTARVY) 50-200-25 MG TABS tablet Take 1 tablet by mouth daily.     levothyroxine (SYNTHROID) 100 MCG tablet Take 1 tablet by mouth once daily 90 tablet 0   zolpidem (AMBIEN) 5 MG tablet Take 5 mg by mouth as needed for sleep.     rosuvastatin (CRESTOR) 20 MG tablet Take 1 tablet (20 mg total) by mouth daily. 90 tablet 0   No facility-administered medications prior to visit.    ROS Review of Systems  Objective:  BP 122/74 (BP Location: Left Arm, Patient Position: Sitting, Cuff Size: Large)   Pulse 62   Temp 97.9 F (36.6 C) (Oral)   Ht 6\' 2"  (1.88 m)   Wt 201 lb (91.2 kg)   SpO2 94%   BMI 25.81 kg/m   BP Readings from Last 3 Encounters:  07/04/23 122/74  03/06/23 122/78  09/19/22 116/66    Wt Readings from Last 3 Encounters:  07/04/23 201 lb (91.2 kg)  05/16/23 193 lb 9.6 oz (87.8 kg)  03/06/23 201 lb (91.2 kg)    Physical Exam  Lab Results  Component Value Date   WBC 4.1 05/08/2023   HGB 14.3 05/08/2023   HCT 43.1 05/08/2023   PLT 156.0 05/08/2023   GLUCOSE 95 02/27/2023   CHOL 223 (H) 02/27/2023   TRIG 89.0 02/27/2023   HDL 60.10 02/27/2023   LDLCALC 145 (H) 02/27/2023   ALT 23 05/08/2023   AST 23 05/08/2023   NA 138 02/27/2023   K 4.1 02/27/2023   CL 105 02/27/2023   CREATININE 0.91 02/27/2023   BUN 15 02/27/2023   CO2 26 02/27/2023   TSH 2.21 05/08/2023   PSA 4.18 (H) 02/27/2023   INR 1.0 02/12/2007   HGBA1C 5.8 02/27/2023    CT CARDIAC SCORING ([facility] LOCATIONS ONLY)  Result Date: 04/18/2023 CLINICAL DATA:  77 year old Caucasian male with history of  dyslipidemia. Evaluate for coronary artery disease. * Tracking Code: FCC * EXAM: CT CARDIAC CORONARY ARTERY CALCIUM SCORE TECHNIQUE: Non-contrast imaging through the heart was performed using prospective ECG gating. Image post processing was performed on an independent workstation, allowing for quantitative analysis of the heart and coronary arteries. Note that this exam targets the heart and the chest was not imaged in its entirety. COMPARISON:  No priors. FINDINGS: CORONARY CALCIUM SCORES: Left Main: 0 LAD: 407 LCx: 54 RCA: 226 Total Agatston Score: 686 MESA database percentile: 73rd AORTA MEASUREMENTS: Ascending Aorta: 3.6 cm Descending Aorta:3.0 cm OTHER FINDINGS: Atherosclerotic calcifications in the thoracic aorta. Several small calcified pulmonary nodules are noted in the lungs bilaterally, indicative of benign granulomas. Within the visualized portions of the thorax there are no suspicious appearing pulmonary nodules or masses, there is no acute consolidative airspace disease, no pleural effusions, no pneumothorax and no lymphadenopathy. Numerous densely calcified left hilar lymph nodes are incidentally noted. Visualized portions of the upper abdomen demonstrates several small calcified granulomas in the spleen. There are no aggressive appearing lytic or blastic lesions noted in the visualized portions of the skeleton. IMPRESSION: 1. Patient's total coronary artery calcium score is 686 which is 73rd percentile for  patient's of matched age, gender and race/ethnicity. Please note that although the presence of coronary artery calcium documents the presence of coronary artery disease, the severity of this disease and any potential stenosis cannot be assessed on this noncontrast CT examination. Assessment for potential risk factor modification, dietary therapy or pharmacologic therapy may be warranted, if clinically indicated. 2.  Aortic Atherosclerosis (ICD10-I70.0). 3. Old granulomatous disease, as above.  Electronically Signed   By: Trudie Reed M.D.   On: 04/18/2023 09:59    Assessment & Plan:  Adult hypothyroidism  Dyslipidemia, goal LDL below 130     Follow-up: Return in about 4 months (around 11/03/2023).  Sanda Linger, MD

## 2023-07-06 ENCOUNTER — Ambulatory Visit: Payer: Medicare Other | Admitting: Internal Medicine

## 2023-07-14 NOTE — Telephone Encounter (Signed)
SWP wife and stated that I needed to move patient to Isaac's schedule for his hip since Dr Noralyn Pick does not specialize in hips. Wife stated that when she was in the office for her appointment Dr Noralyn Pick specifically stated that he would see her husband and evaluate his hip.

## 2023-07-17 ENCOUNTER — Encounter: Attending: Family Medicine | Primary: Family Medicine

## 2023-07-17 ENCOUNTER — Encounter: Admit: 2023-07-17 | Discharge: 2023-07-17 | Payer: MEDICARE | Attending: Orthopaedic Surgery | Primary: Family Medicine

## 2023-07-17 DIAGNOSIS — M25551 Pain in right hip: Secondary | ICD-10-CM

## 2023-07-17 NOTE — Progress Notes (Signed)
[facility]  Kathrin Ruddy [address]     PROVIDER:  Roxanne Mins, MD   Date of Encounter:  07/17/2023     PATIENT INFORMATION:   Name: Oscar Turner  Age:  77 y.o.  Sex:  male  DOB:  [birth year]     MRN:  [id number]  Cell Phone:    Telephone Information:   Mobile [phone]        Address:  [address]  [place] [address]   PCP:  Hurman Horn, MD  Insurance:  Payor: MEDICARE / Plan: MEDICARE PART A AND B / Product Type: *No Product type* /         CHIEF COMPLAINT:       Chief Complaint   Patient presents with    Follow-up     Right Hip         History of Present Illness:    The patient is a 77 y.o. year-old male who is an established patient of mine.  I last saw him on 06/02/2023 after 3 rotator cuff surgery.  He now presents today with a new complaint of right hip pain.  He states that he has had a longstanding complaint of low back pain with intermittent radiculopathy down the right lower extremity secondary to a L4 compression fracture many years ago.  He has been treated conservatively by Dr. Marletta Lor at [facility] spine with cortisone injections.  However, in July 2024 he developed new pain localized to the right lateral upper thigh and groin area.  There is associated numbness.  It is worse with prolonged sitting, golf and his activities of daily living.  It is intermittent.  The most significant complaint is that it will cause his leg to buckle or give out in the middle of activity such as walking or playing golf.  He is concerned that it could cause him to fall.    Past history not for coding or billing purposes:    The patient is status post a right shoulder rotator cuff repair for a 4 x 3 cm tear, subscapularis debridement, proximal biceps tenodesis, labral debridement and subacromial decompression performed on 10/11/2018.  He returned to all activities including golfing without pain.    He is also status post a left shoulder arthroscopic 3 x 2 cm  supraspinatus and infraspinatus repair, subscapularis repair, subacromial decompression and labral debridement performed on 05/11/2017. He returned all activities without pain.    He also presents for follow-up for his left elbow olecranon bursitis. I aspirated it at the last visit on 06/21/2017 at 07/05/2017 and 07/28/2017. I also wrapped the elbow. The patient returns stating that his swelling has finally resolved.     I last saw the patient on 04/15/2015 for medial epicondylitis. We gave him a cortisone injection on that date which gave him excellent relief.    I have also seen the patient in the past for left radicular symptoms that resolved.     PHYSICAL EXAMINATION:     General: Patient is alert and oriented to person, place and time. Well-developed, well-nourished, well groomed, in no acute distress. Normal mood and affect, cooperative, appropriate attitude.     Cardiovascular: Regular rhythm by palpation of distal pulse, normal color and temperature, no pitting edema of uninvolved LE     Pulmonary: breathing comfortably, normal and symmetric chest expansion, no intercostal retractions     Musculoskeletal:    Examination of the right hip reveals  that skin is intact.  There is no significant swelling.  Range of motion reveals that he has forward flexion to 120 degrees, external rotation 65 degrees and internal rotation of 15 degrees associated with minimal pain.  He has a negative logroll test.  He has 5 out of 5 strength.  Sensation is intact.  He has less than 2-second capillary refill.    IMAGING:     XR HIP RIGHT (2-3 VIEWS) (CLINIC PERFORMED)  X-Ray--Right Hip--AP Pelvis, Lateral- 06/02/2023 :I personally reviewed the   imaging and it demonstrated: Mild joint space narrowing with diffuse   osteophyte formation.  Significant degenerative change with scoliosis of   the lumbar spine noted.     Previous imaging studies:    An MRI was performed on the right shoulder at [facility] on 09/14/2018. I reviewed the report. I  also performed an independent review the images which reveals that the patient has a full-thickness tear of the supraspinatus rotator cuff with approximately 2.5 cm of retraction. There is subdeltoid bursitis.             08/20/2018: 3 views of the right shoulder were taken today at the  [facility] office and reviewed. They reveal no significant abnormalities.     ASSESSMENT(S):     Diagnosis Orders   1. Right hip pain  IR ARTHR/ASP/INJ MAJOR JT/BURSA RIGHT WO [place]      2. Primary osteoarthritis of right hip        3. Sciatica of right side          TREATMENT:     I had a very long discussion with the patient and I reviewed his x-rays and correlated them with his physical exam findings.  I explained that he had evidence for mild to moderate osteoarthritis of the right hip.  I discussed that the symptoms into the lateral hip especially associated with numbness is most consistent with sciatica coming from his back.  However, I discussed that there could be a contribution from his hip causing the giving way as both hip arthritis as well as sciatica can cause the lower extremity give out or give way.  I discussed that we could use a cortisone injection as a diagnostic and therapeutic modality for the right hip.  The patient would like to proceed and so I will give him a referral to interventional radiology for this.  I explained that if it does not give him relief then it is all coming from his back.  He states that he also sees Dr. Marletta Lor on Wednesday and he can talk to him about these options.     Follow Up:     With Dr. Noralyn Pick as needed    Procedures:       DME:         Orders:     No orders of the defined types were placed in this encounter.       Rx:     Outpatient Encounter Medications as of 07/17/2023   Medication Sig Dispense Refill    sildenafil (VIAGRA) 100 MG tablet Take by mouth      rosuvastatin (CRESTOR) 20 MG tablet Take 1 tablet by mouth daily 90 tablet 2    Coenzyme Q10 10 MG CAPS Take by mouth       testosterone cypionate (DEPOTESTOTERONE CYPIONATE) 200 MG/ML injection Inject 1 mL into the muscle every 14 days for 90 days. 2 mL 2    Multiple Vitamins-Minerals (MULTI FOR HIM) TABS Take  by mouth      Garlic 300 MG CAPS Take by mouth      Omega-3 Fatty Acids (FISH OIL) 500 MG CAPS Take 500 mg by mouth 2 times daily      turmeric 500 MG CAPS Take by mouth As directed       No facility-administered encounter medications on file as of 07/17/2023.        PAST MEDICAL HISTORY:   Past Medical History:   Diagnosis Date    Complete rotator cuff tear of left shoulder 06/04/2021    S/p repair.    Compression fracture of lumbosacral spine (HCC) 06/04/2021    HLD (hyperlipidemia)     Low testosterone     Melanoma in situ (HCC)     Chest. S/P excision.    Nontraumatic complete tear of right rotator cuff 06/04/2021    S/p repair.    Prostate cancer [facility])     Skin cancer of face     s/p excision. Dr. Sunny Schlein        PAST SURGICAL HISTORY:   Past Surgical History:   Procedure Laterality Date    [place]    OTHER SURGICAL HISTORY  08/2014    Compression fracture of L4    PROSTATECTOMY  02/2011    radical    ROTATOR CUFF REPAIR Right 10/11/2018    Dr. Noralyn Pick    SHOULDER ARTHROSCOPY W/ ROTATOR CUFF REPAIR Left 05/11/2017    Dr. Collene Mares    TONSILLECTOMY      WISDOM TOOTH EXTRACTION           FAMILY HISTORY:   Family History   Problem Relation Age of Onset    Kidney Disease Mother     Stroke Father     Lung Cancer Father     Hypertension Father     Gout Father     Hearing Loss Father     Prostate Cancer Father     Alcohol Abuse Brother         deceased    No Known Problems Brother     No Known Problems Daughter     No Known Problems Daughter         SOCIAL HISTORY:   Social History     Occupational History    Not on file   Tobacco Use    Smoking status: Former     Types: Pipe     Start date: 1969     Quit date: 2017     Years since quitting: 7.6    Smokeless tobacco: Never   Vaping Use    Vaping status: Never Used    Substance and Sexual Activity    Alcohol use: Yes     Alcohol/week: 7.0 standard drinks of alcohol     Types: 7 Glasses of wine per week     Comment: have a glass of wine every night with dinner    Drug use: Never    Sexual activity: Yes     Partners: Female     Comment: married       MEDICATIONS:   Current Outpatient Medications   Medication Sig Dispense Refill    sildenafil (VIAGRA) 100 MG tablet Take by mouth      rosuvastatin (CRESTOR) 20 MG tablet Take 1 tablet by mouth daily 90 tablet 2    Coenzyme Q10 10 MG CAPS Take by mouth      testosterone cypionate (DEPOTESTOTERONE CYPIONATE) 200 MG/ML  injection Inject 1 mL into the muscle every 14 days for 90 days. 2 mL 2    Multiple Vitamins-Minerals (MULTI FOR HIM) TABS Take by mouth      Garlic 300 MG CAPS Take by mouth      Omega-3 Fatty Acids (FISH OIL) 500 MG CAPS Take 500 mg by mouth 2 times daily      turmeric 500 MG CAPS Take by mouth As directed       No current facility-administered medications for this visit.        ALLERGIES:   No Known Allergies    REVIEW OF SYSTEMS:     Review of Systems   Musculoskeletal: see HPI for orthopaedic medical issues     Notes: Patient is to continue all medications as directed by prescribing physicians. Continuations on today's visit are made based on the patient's report of current medications.      Current Meds Verified: Current meds/immunizations reviewed, including purpose with pt. Med Recon list given to pt/family. Pt advised to discard old med lists and provide all providers with current list at each visit and carry list with them in case of emergency     This document was created using voice recognition software so mistakes are possible. For any concerns about the wording of this document, please contact its creator for further clarification.

## 2023-07-19 ENCOUNTER — Ambulatory Visit: Payer: Medicare Other | Admitting: Cardiology

## 2023-08-03 DIAGNOSIS — Z23 Encounter for immunization: Secondary | ICD-10-CM | POA: Diagnosis not present

## 2023-08-03 DIAGNOSIS — K6282 Dysplasia of anus: Secondary | ICD-10-CM | POA: Diagnosis not present

## 2023-08-11 ENCOUNTER — Inpatient Hospital Stay: Admit: 2023-08-11 | Payer: MEDICARE | Primary: Family Medicine

## 2023-08-11 DIAGNOSIS — C61 Malignant neoplasm of prostate: Secondary | ICD-10-CM

## 2023-08-11 LAB — CBC
Hematocrit: 50.3 % (ref 38.0–52.0)
Hemoglobin: 16.8 g/dL (ref 13.0–17.3)
MCH: 31 pg (ref 27.0–34.5)
MCHC: 33.4 g/dL (ref 30.0–36.0)
MCV: 92.8 fL (ref 84.0–100.0)
MPV: 9.7 fL (ref 7.0–12.2)
Platelets: 227 10*3/uL (ref 140–440)
RBC: 5.42 x10e6/mcL (ref 4.00–5.60)
RDW: 12.5 % (ref 10.0–17.0)
WBC: 5.7 10*3/uL (ref 3.8–10.6)

## 2023-08-11 LAB — HEPATIC FUNCTION PANEL
ALT: 30 U/L (ref 0–50)
AST: 23 U/L (ref 0–50)
Albumin: 4.2 g/dL (ref 3.5–5.2)
Alk Phosphatase: 70 U/L (ref 40–130)
Bilirubin, Direct: <0.2 mg/dL (ref 0.00–0.30)
Total Bilirubin: 0.53 mg/dL (ref 0.00–1.20)
Total Protein: 6.5 g/dL (ref 5.7–8.3)

## 2023-08-11 LAB — PSA, DIAGNOSTIC: PSA: 0.05 ng/mL (ref 0.000–4.000)

## 2023-08-11 LAB — TESTOSTERONE: Testosterone: 272 ng/dL (ref 193.0–740.0)

## 2023-08-17 DIAGNOSIS — Z1389 Encounter for screening for other disorder: Secondary | ICD-10-CM | POA: Diagnosis not present

## 2023-08-17 DIAGNOSIS — R7301 Impaired fasting glucose: Secondary | ICD-10-CM | POA: Diagnosis not present

## 2023-08-17 DIAGNOSIS — B20 Human immunodeficiency virus [HIV] disease: Secondary | ICD-10-CM | POA: Diagnosis not present

## 2023-08-17 DIAGNOSIS — E7800 Pure hypercholesterolemia, unspecified: Secondary | ICD-10-CM | POA: Diagnosis not present

## 2023-08-17 DIAGNOSIS — Z0000 Encounter for general adult medical examination without abnormal findings: Secondary | ICD-10-CM | POA: Diagnosis not present

## 2023-08-17 DIAGNOSIS — E039 Hypothyroidism, unspecified: Secondary | ICD-10-CM | POA: Diagnosis not present

## 2023-08-17 DIAGNOSIS — N529 Male erectile dysfunction, unspecified: Secondary | ICD-10-CM | POA: Diagnosis not present

## 2023-08-18 ENCOUNTER — Ambulatory Visit: Admit: 2023-08-18 | Discharge: 2023-08-18 | Payer: MEDICARE | Attending: Family Medicine | Primary: Family Medicine

## 2023-08-18 VITALS — BP 118/64 | HR 60 | Ht 67.99 in | Wt 168.5 lb

## 2023-08-18 DIAGNOSIS — Z0000 Encounter for general adult medical examination without abnormal findings: Secondary | ICD-10-CM

## 2023-08-18 MED ORDER — ROSUVASTATIN CALCIUM 20 MG PO TABS
20 | ORAL_TABLET | Freq: Every day | ORAL | 2 refills | Status: DC
Start: 2023-08-18 — End: 2024-10-10

## 2023-08-18 NOTE — Progress Notes (Signed)
 [facility]  Oscar CHRISTELLA Portugal, MD   [address] [place] [address]  Phone:  [phone]  Fax:  [phone]    CHIEF COMPLAINT:  Chief Complaint   Patient presents with    Medicare AWV    Follow-up      HISTORY OF PRESENT ILLNESS:  Oscar Turner is a 77 y.o. male  who presents today for Medicare AWV and medically necessary follow-up of chronic hyperlipidemia.  Patient reports no acute concerns or complaints at this time.  We will review the results of his recent labs and refill medications as indicated.  He does continue to follow with urology regularly for testosterone  supplementation and Viagra .    Current Outpatient Medications   Medication Sig Dispense Refill    rosuvastatin  (CRESTOR ) 20 MG tablet Take 1 tablet by mouth daily 90 tablet 2    sildenafil  (VIAGRA ) 100 MG tablet Take 1 tablet by mouth as needed      Coenzyme Q10 10 MG CAPS Take by mouth      testosterone  cypionate (DEPOTESTOTERONE CYPIONATE) 200 MG/ML injection Inject 1 mL into the muscle every 14 days for 90 days. 2 mL 2    Garlic 300 MG CAPS Take by mouth      Omega-3 Fatty Acids (FISH OIL) 500 MG CAPS Take 500 mg by mouth 2 times daily      turmeric 500 MG CAPS Take by mouth As directed      Multiple Vitamins-Minerals (MULTI FOR HIM) TABS Take by mouth       No current facility-administered medications for this visit.        Past Medical History:   Diagnosis Date    Complete rotator cuff tear of left shoulder 06/04/2021    S/p repair.    Compression fracture of lumbosacral spine (HCC) 06/04/2021    HLD (hyperlipidemia)     Low testosterone      Melanoma in situ (HCC)     Chest. S/P excision.    Nontraumatic complete tear of right rotator cuff 06/04/2021    S/p repair.    Prostate cancer [facility])     Skin cancer of face     s/p excision. Dr. Abron     REVIEW OF SYSTEMS:  Review of Systems   Constitutional:  Negative for activity change, appetite change, chills, fever and unexpected weight change.   Respiratory:   Negative for cough and shortness of breath.    Cardiovascular:  Negative for chest pain, palpitations and leg swelling.   Gastrointestinal:  Negative for abdominal pain and blood in stool.   Skin:  Negative for rash.   Neurological:  Negative for dizziness, light-headedness and headaches.   Psychiatric/Behavioral:  Negative for dysphoric mood, sleep disturbance and suicidal ideas. The patient is not nervous/anxious.    Review of systems is as indicated above and in HPI, otherwise negative.    PHYSICAL EXAM:  Vital Signs:  Vitals:    08/18/23 1518   BP: 118/64   Pulse: 60   SpO2: 98%   Weight: 76.4 kg (168 lb 8 oz)   Height: 1.727 m (5' 7.99)      Physical Exam  Vitals and nursing note reviewed.   Constitutional:       General: He is not in acute distress.     Appearance: Normal appearance. He is normal weight.   HENT:      Head: Normocephalic and atraumatic.      Right Ear:  Tympanic membrane, ear canal and external ear normal.      Left Ear: Tympanic membrane, ear canal and external ear normal.      Nose: Nose normal.      Mouth/Throat:      Mouth: Mucous membranes are moist.   Eyes:      General: No scleral icterus.     Extraocular Movements: Extraocular movements intact.      Conjunctiva/sclera: Conjunctivae normal.      Pupils: Pupils are equal, round, and reactive to light.   Cardiovascular:      Rate and Rhythm: Normal rate and regular rhythm.      Pulses: Normal pulses.      Heart sounds: Normal heart sounds. No murmur heard.     No friction rub. No gallop.   Pulmonary:      Effort: Pulmonary effort is normal. No respiratory distress.      Breath sounds: Normal breath sounds. No wheezing, rhonchi or rales.   Abdominal:      General: Bowel sounds are normal. There is no distension.      Palpations: Abdomen is soft. There is no mass.      Tenderness: There is no abdominal tenderness.   Musculoskeletal:      Cervical back: Normal range of motion and neck supple. No tenderness.   Lymphadenopathy:      Cervical:  No cervical adenopathy.   Skin:     General: Skin is warm and dry.      Capillary Refill: Capillary refill takes less than 2 seconds.      Findings: No lesion.   Neurological:      General: No focal deficit present.      Mental Status: He is alert and oriented to person, place, and time. Mental status is at baseline.   Psychiatric:         Mood and Affect: Mood normal.         Behavior: Behavior normal.         Thought Content: Thought content normal.         Judgment: Judgment normal.        PHQ:      08/15/2023     3:42 PM   PHQ-9    Little interest or pleasure in doing things 0   Feeling down, depressed, or hopeless 0   PHQ-2 Score 0   PHQ-9 Total Score 0     Hospital Outpatient Visit on 08/11/2023   Component Date Value Ref Range Status    WBC 08/11/2023 5.7  3.8 - 10.6 x10e3/mcL Final    RBC 08/11/2023 5.42  4.00 - 5.60 x10e6/mcL Final    Hemoglobin 08/11/2023 16.8  13.0 - 17.3 g/dL Final    Hematocrit 90/79/7975 50.3  38.0 - 52.0 % Final    MCV 08/11/2023 92.8  84.0 - 100.0 fL Final    MCH 08/11/2023 31.0  27.0 - 34.5 pg Final    MCHC 08/11/2023 33.4  30.0 - 36.0 g/dL Final    RDW 90/79/7975 12.5  10.0 - 17.0 % Final    Platelets 08/11/2023 227  140 - 440 x10e3/mcL Final    MPV 08/11/2023 9.7  7.0 - 12.2 fL Final    Total Protein 08/11/2023 6.5  5.7 - 8.3 g/dL Final    Albumin 90/79/7975 4.2  3.5 - 5.2 g/dL Final    Total Bilirubin 08/11/2023 0.53  0.00 - 1.20 mg/dL Final    Alk Phosphatase 08/11/2023 70  40 - 130  unit/L Final    Bilirubin, Direct 08/11/2023 <0.20  0.00 - 0.30 mg/dL Final    Comment: Bilirubin Indirect unable to be calculated because Bilirubin Direct is less  than 0.2 mg/dL.      AST 08/11/2023 23  0 - 50 unit/L Final    ALT 08/11/2023 30  0 - 50 unit/L Final    Testosterone  08/11/2023 272.0  193.0 - 740.0 ng/dL Final    PSA 90/79/7975 0.050  0.000 - 4.000 ng/mL Final    Comment: PSA INTERPRETATION:    At this time, no major scientific/medical organization including the American  Cancer Society  and the American Urological Association have specific  recommendations in regard to prostate specific antigen (PSA) testing other  than recommending that men at age 72 and above and those who are at high risk  at age 23-45 and above be counseled in regard to the pros and cons of PSA  testing.    Guidelines for risk stratification    1.  Below 1:   Very low risk of prostate cancer  2.  1-4:        Approximately 15% risk of prostate cancer  3.  4-10:       25% risk of prostate cancer  4.  >10:        50% risk of prostate cancer    The use of PSA velocity (rate of rise of PSA over time) may also be useful in  predicting the risk of prostate cancer.  Most authorities recommend PSA  testing on at least three occasions over a period of 18 months to get an  accurate PSA velocity.    PSA measured by Roche Cobas electrochemiluminescence immunoassay ECLIA  methodology.  PSA valu                           es determined on patient samples by different testing procedures  cannot be directly compared with one another and could be the cause of  erroneous medical interpretations.    References available by request.        IMPRESSION/PLAN:  1. Medicare annual wellness visit, subsequent  Patient is reminded that he is due for tetanus booster in the event of injury.  Offered and declined hepatitis B vaccination today.  Due for repeat lipid panel at the time of next labs.  Otherwise up-to-date on routine health maintenance interventions.  See note below.    2. Mixed hyperlipidemia  Hyperlipidemia previously well-controlled on moderate intensity statin therapy with rosuvastatin .  No adverse effects reported.  Continue as prescribed and recheck lipid panel as ordered below.  - rosuvastatin  (CRESTOR ) 20 MG tablet; Take 1 tablet by mouth daily  Dispense: 90 tablet; Refill: 2  - Lipid Panel; Future        Oscar CHRISTELLA Portugal, MD    This note was typed and/or dictated by Dragon Naturally Speaking at the point-of-care.  Reasonable attempt at  proofreading has been made, however, occasional wrong-word or `sound-a-like substitutions persist due to the inherent limitations of voice recognition software.  The intent is to have a complete and accurate medical record.  As a valued partner in this safety effort, if you have noted factual errors, please complete the Health Information Amendment/Correct Form or call the [facility] Health Information Management Office at [phone]. Please notify the dino if any discrepancies are noted or if the meaning of any statement is unclear.    Medicare Annual Wellness Visit  Oscar Turner is here for Medicare AWV and Follow-up    Assessment & Plan   Medicare annual wellness visit, subsequent  Mixed hyperlipidemia  -     rosuvastatin  (CRESTOR ) 20 MG tablet; Take 1 tablet by mouth daily, Disp-90 tablet, R-2Normal  -     Lipid Panel; Future  Recommendations for Preventive Services Due: see orders and patient instructions/AVS.  Recommended screening schedule for the next 5-10 years is provided to the patient in written form: see Patient Instructions/AVS.     Return in 1 year (on 08/17/2024).     Subjective   See above.    Patient's complete Health Risk Assessment and screening values have been reviewed and are found in Flowsheets. The following problems were reviewed today and where indicated follow up appointments were made and/or referrals ordered.    No Positive Risk Factors identified today.                              Objective   Vitals:    08/18/23 1518   BP: 118/64   Pulse: 60   SpO2: 98%   Weight: 76.4 kg (168 lb 8 oz)   Height: 1.727 m (5' 7.99)      Body mass index is 25.63 kg/m.        See exam above.          No Known Allergies  Prior to Visit Medications    Medication Sig Taking? Authorizing Provider   rosuvastatin  (CRESTOR ) 20 MG tablet Take 1 tablet by mouth daily Yes Oscar Oscar HERO, MD   sildenafil  (VIAGRA ) 100 MG tablet Take 1 tablet by mouth as needed Yes [provider]   Coenzyme Q10 10 MG  CAPS Take by mouth Yes [provider]   testosterone  cypionate (DEPOTESTOTERONE CYPIONATE) 200 MG/ML injection Inject 1 mL into the muscle every 14 days for 90 days. Yes Curtistine Powell Bottcher, PA   Garlic 300 MG CAPS Take by mouth Yes [provider]   Omega-3 Fatty Acids (FISH OIL) 500 MG CAPS Take 500 mg by mouth 2 times daily Yes [provider]   turmeric 500 MG CAPS Take by mouth As directed Yes [provider]   Multiple Vitamins-Minerals (MULTI FOR HIM) TABS Take by mouth  [provider]       CareTeam (Including outside providers/suppliers regularly involved in providing care):   Patient Care Team:  Oscar Oscar HERO, MD as PCP - General (Family Medicine)  Oscar Oscar HERO, MD as PCP - Empaneled Provider  Abron Fitch, MD (Dermatology)  Donaciano Dorn BROCKS, MD as Consulting Physician (Urology)      Reviewed and updated this visit:  Tobacco  Allergies  Meds  Problems  Med Hx  Surg Hx  Soc Hx  Fam Hx

## 2023-08-18 NOTE — Patient Instructions (Signed)
 9 Ways to Cut Back on Drinking  Maybe you've found yourself drinking more alcohol than you'd prefer. If you want to cut back, here are some ideas to try.    Think before you drink.  Do you really want a drink, or is it just a habit? If you're used to having a drink at a certain time, try doing something else then.     Look for substitutes.  Find some no-alcohol drinks that you enjoy, like flavored seltzer water, tea with honey, or tonic with a slice of lime. Or try alcohol-free beer or virgin cocktails (without the alcohol).     Drink more water.  Use water to quench your thirst. Drink a glass of water before you have any alcohol. Have another glass along with every drink or between drinks.     Shrink your drink.  For example, have a bottle of beer instead of a pint. Use a smaller glass for wine. Choose drinks with lower alcohol content (ABV%). Or use less liquor and more mixer in cocktails.     Slow down.  It's easy to drink quickly and without thinking about it. Pay attention, and make each drink last longer.     Do the math.  Total up how much you spend on alcohol each month. How much is that a year? If you cut back, what could you do with the money you save?     Take a break.  Choose a day or two each week when you won't drink at all. Notice how you feel on those days, physically and emotionally. How did you sleep? Do you feel better? Over time, add more break days.     Count calories.  Would you like to lose some weight? For some people that's a good motivator for cutting back. Figure out how many calories are in each drink. How many does that add up to in a day? In a week? In a month?     Practice saying no.  Be ready when someone offers you a drink. Try: Thanks, I've had enough. Or Thanks, but I'm cutting back. Or No, thanks. I feel better when I drink less.   Current as of: October 05, 2022  Content Version: 14.1   2006-2024 [employer].   Care instructions adapted under license  by [facility]. If you have questions about a medical condition or this instruction, always ask your healthcare professional. [employer] disclaims any warranty or liability for your use of this information.         A Healthy Heart: Care Instructions  Overview     Coronary artery disease, also called heart disease, occurs when a substance called plaque builds up in the vessels that supply oxygen-rich blood to your heart muscle. This can narrow the blood vessels and reduce blood flow. A heart attack happens when blood flow is completely blocked. A high-fat diet, smoking, and other factors increase the risk of heart disease.  Your doctor has found that you have a chance of having heart disease. A heart-healthy lifestyle can help keep your heart healthy and prevent heart disease. This lifestyle includes eating healthy, being active, staying at a weight that's healthy for you, and not smoking or using tobacco. It also includes taking medicines as directed, managing other health conditions, and trying to get a healthy amount of sleep.  Follow-up care is a key part of your treatment and safety. Be sure to make and go to all appointments, and call your  doctor if you are having problems. It's also a good idea to know your test results and keep a list of the medicines you take.  How can you care for yourself at home?  Diet   Use less salt when you cook and eat. This helps lower your blood pressure. Taste food before salting. Add only a little salt when you think you need it. With time, your taste buds will adjust to less salt.    Eat fewer snack items, fast foods, canned soups, and other high-salt, high-fat, processed foods.    Read food labels and try to avoid saturated and trans fats. They increase your risk of heart disease by raising cholesterol levels.    Limit the amount of solid fat--butter, margarine, and shortening--you eat. Use olive, peanut, or canola oil when you cook. Bake, broil, and steam  foods instead of frying them.    Eat a variety of fruit and vegetables every day. Dark green, deep orange, red, or yellow fruits and vegetables are especially good for you. Examples include spinach, carrots, peaches, and berries.    Foods high in fiber can reduce your cholesterol and provide important vitamins and minerals. High-fiber foods include whole-grain cereals and breads, oatmeal, beans, brown rice, citrus fruits, and apples.    Eat lean proteins. Heart-healthy proteins include seafood, lean meats and poultry, eggs, beans, peas, nuts, seeds, and soy products.    Limit drinks and foods with added sugar. These include candy, desserts, and soda pop.   Heart-healthy lifestyle   If your doctor recommends it, get more exercise. For many people, walking is a good choice. Or you may want to swim, bike, or do other activities. Bit by bit, increase the time you're active every day. Try for at least 30 minutes on most days of the week.    Try to quit or cut back on using tobacco and other nicotine products. This includes smoking and vaping. If you need help quitting, talk to your doctor about stop-smoking programs and medicines. These can increase your chances of quitting for good. Quitting is one of the most important things you can do to protect your heart. It is never too late to quit. Try to avoid secondhand smoke too.    Stay at a weight that's healthy for you. Talk to your doctor if you need help losing weight.    Try to get 7 to 9 hours of sleep each night.    Limit alcohol to 2 drinks a day for men and 1 drink a day for women. Too much alcohol can cause health problems.    Manage other health problems such as diabetes, high blood pressure, and high cholesterol. If you think you may have a problem with alcohol or drug use, talk to your doctor.   Medicines   Take your medicines exactly as prescribed. Call your doctor if you think you are having a problem with your medicine.    If your doctor  recommends aspirin, take the amount directed each day. Make sure you take aspirin and not another kind of pain reliever, such as acetaminophen  (Tylenol ).   When should you call for help?   Call 911 if you have symptoms of a heart attack. These may include:   Chest pain or pressure, or a strange feeling in the chest.    Sweating.    Shortness of breath.    Pain, pressure, or a strange feeling in the back, neck, jaw, or upper belly or in one or  both shoulders or arms.    Lightheadedness or sudden weakness.    A fast or irregular heartbeat.   After you call 911, the operator may tell you to chew 1 adult-strength or 2 to 4 low-dose aspirin. Wait for an ambulance. Do not try to drive yourself.  Watch closely for changes in your health, and be sure to contact your doctor if you have any problems.  Where can you learn more?  Go to [employer] and enter F075 to learn more about A Healthy Heart: Care Instructions.  Current as of: May 14, 2022  Content Version: 14.1   2006-2024 [employer].   Care instructions adapted under license by [facility]. If you have questions about a medical condition or this instruction, always ask your healthcare professional. [employer] disclaims any warranty or liability for your use of this information.      Personalized Preventive Plan for Oscar Turner - 08/18/2023  Medicare offers a range of preventive health benefits. Some of the tests and screenings are paid in full while other may be subject to a deductible, co-insurance, and/or copay.    Some of these benefits include a comprehensive review of your medical history including lifestyle, illnesses that may run in your family, and various assessments and screenings as appropriate.    After reviewing your medical record and screening and assessments performed today your provider may have ordered immunizations, labs, imaging, and/or referrals for you.  A list of these orders (if  applicable) as well as your Preventive Care list are included within your After Visit Summary for your review.    Other Preventive Recommendations:    A preventive eye exam performed by an eye specialist is recommended every 1-2 years to screen for glaucoma; cataracts, macular degeneration, and other eye disorders.  A preventive dental visit is recommended every 6 months.  Try to get at least 150 minutes of exercise per week or 10,000 steps per day on a pedometer .  Order or download the FREE Exercise & Physical Activity: Your Everyday Guide from [employer]. Call (320)264-4416 or search [employer] online.  You need 1200-1500 mg of calcium  and 1000-2000 IU of vitamin D per day. It is possible to meet your calcium  requirement with diet alone, but a vitamin D supplement is usually necessary to meet this goal.  When exposed to the sun, use a sunscreen that protects against both UVA and UVB radiation with an SPF of 30 or greater. Reapply every 2 to 3 hours or after sweating, drying off with a towel, or swimming.  Always wear a seat belt when traveling in a car. Always wear a helmet when riding a bicycle or motorcycle.

## 2023-08-21 ENCOUNTER — Ambulatory Visit: Payer: Medicare Other | Admitting: Cardiology

## 2023-08-24 ENCOUNTER — Other Ambulatory Visit: Payer: Self-pay | Admitting: Internal Medicine

## 2023-08-24 DIAGNOSIS — E039 Hypothyroidism, unspecified: Secondary | ICD-10-CM

## 2023-10-10 ENCOUNTER — Other Ambulatory Visit (HOSPITAL_COMMUNITY): Payer: Medicare Other | Attending: Oncology

## 2023-10-11 ENCOUNTER — Ambulatory Visit: Payer: Medicare Other | Admitting: Cardiology

## 2023-11-01 DIAGNOSIS — H2513 Age-related nuclear cataract, bilateral: Secondary | ICD-10-CM | POA: Diagnosis not present

## 2023-11-01 DIAGNOSIS — H5203 Hypermetropia, bilateral: Secondary | ICD-10-CM | POA: Diagnosis not present

## 2023-11-06 ENCOUNTER — Ambulatory Visit: Payer: Medicare Other | Admitting: Internal Medicine

## 2023-11-19 ENCOUNTER — Other Ambulatory Visit: Payer: Self-pay | Admitting: Internal Medicine

## 2023-11-19 DIAGNOSIS — E039 Hypothyroidism, unspecified: Secondary | ICD-10-CM

## 2023-11-27 ENCOUNTER — Ambulatory Visit: Payer: Medicare Other | Admitting: Internal Medicine

## 2023-11-30 DIAGNOSIS — Z131 Encounter for screening for diabetes mellitus: Secondary | ICD-10-CM | POA: Diagnosis not present

## 2023-11-30 DIAGNOSIS — Z7189 Other specified counseling: Secondary | ICD-10-CM | POA: Diagnosis not present

## 2023-11-30 DIAGNOSIS — Z113 Encounter for screening for infections with a predominantly sexual mode of transmission: Secondary | ICD-10-CM | POA: Diagnosis not present

## 2023-11-30 DIAGNOSIS — Z21 Asymptomatic human immunodeficiency virus [HIV] infection status: Secondary | ICD-10-CM | POA: Diagnosis not present

## 2023-11-30 DIAGNOSIS — Z5181 Encounter for therapeutic drug level monitoring: Secondary | ICD-10-CM | POA: Diagnosis not present

## 2023-11-30 DIAGNOSIS — Z79899 Other long term (current) drug therapy: Secondary | ICD-10-CM | POA: Diagnosis not present

## 2023-11-30 DIAGNOSIS — E039 Hypothyroidism, unspecified: Secondary | ICD-10-CM | POA: Diagnosis not present

## 2023-11-30 DIAGNOSIS — Z9189 Other specified personal risk factors, not elsewhere classified: Secondary | ICD-10-CM | POA: Diagnosis not present

## 2023-11-30 DIAGNOSIS — Z111 Encounter for screening for respiratory tuberculosis: Secondary | ICD-10-CM | POA: Diagnosis not present

## 2023-11-30 DIAGNOSIS — B20 Human immunodeficiency virus [HIV] disease: Secondary | ICD-10-CM | POA: Diagnosis not present

## 2023-11-30 DIAGNOSIS — E785 Hyperlipidemia, unspecified: Secondary | ICD-10-CM | POA: Diagnosis not present

## 2023-11-30 DIAGNOSIS — Z1159 Encounter for screening for other viral diseases: Secondary | ICD-10-CM | POA: Diagnosis not present

## 2023-11-30 DIAGNOSIS — Z23 Encounter for immunization: Secondary | ICD-10-CM | POA: Diagnosis not present

## 2023-12-13 ENCOUNTER — Ambulatory Visit: Payer: Medicare Other | Admitting: Cardiology

## 2024-02-05 ENCOUNTER — Inpatient Hospital Stay: Admit: 2024-02-05 | Payer: MEDICARE | Primary: Family Medicine

## 2024-02-05 DIAGNOSIS — E291 Testicular hypofunction: Secondary | ICD-10-CM

## 2024-02-05 LAB — CBC WITH AUTO DIFFERENTIAL
Basophils %: 0.8 % (ref 0.0–2.0)
Basophils Absolute: 0.1 10*3/uL (ref 0.0–0.2)
Eosinophils %: 2 % (ref 0.0–7.0)
Eosinophils Absolute: 0.1 10*3/uL (ref 0.0–0.5)
Hematocrit: 50.2 % (ref 38.0–52.0)
Hemoglobin: 16.7 g/dL (ref 13.0–17.3)
Immature Grans (Abs): 0.03 10*3/uL (ref 0.00–0.06)
Immature Granulocytes %: 0.5 % (ref 0.0–0.6)
Lymphocytes Absolute: 1.6 10*3/uL (ref 1.0–3.2)
Lymphocytes: 24.8 % (ref 15.0–45.0)
MCH: 31 pg (ref 27.0–34.5)
MCHC: 33.3 g/dL (ref 30.0–36.0)
MCV: 93.3 fL (ref 84.0–100.0)
MPV: 9.5 fL (ref 7.0–12.2)
Monocytes %: 8 % (ref 4.0–12.0)
Monocytes Absolute: 0.5 10*3/uL (ref 0.3–1.0)
Neutrophils %: 63.9 % (ref 42.0–74.0)
Neutrophils Absolute: 4.2 10*3/uL (ref 1.6–7.3)
Platelets: 224 10*3/uL (ref 140–440)
RBC: 5.38 x10e6/mcL (ref 4.00–5.60)
RDW: 12.8 % (ref 10.0–17.0)
WBC: 6.5 10*3/uL (ref 3.8–10.6)

## 2024-02-05 LAB — PSA, DIAGNOSTIC: PSA: 0.068 ng/mL (ref 0.000–4.000)

## 2024-02-05 LAB — HEPATIC FUNCTION PANEL
ALT: 29 U/L (ref 0–42)
AST: 21 U/L (ref 0–46)
Albumin: 4.3 g/dL (ref 3.5–5.2)
Alk Phosphatase: 68 U/L (ref 40–130)
Bilirubin, Direct: <0.2 mg/dL (ref 0.00–0.30)
Total Bilirubin: 0.33 mg/dL (ref 0.00–1.20)
Total Protein: 6.5 g/dL (ref 5.7–8.3)

## 2024-02-05 LAB — TESTOSTERONE: Testosterone: 648 ng/dL (ref 193.0–740.0)

## 2024-02-08 DIAGNOSIS — D2261 Melanocytic nevi of right upper limb, including shoulder: Secondary | ICD-10-CM | POA: Diagnosis not present

## 2024-02-08 DIAGNOSIS — D225 Melanocytic nevi of trunk: Secondary | ICD-10-CM | POA: Diagnosis not present

## 2024-02-08 DIAGNOSIS — L821 Other seborrheic keratosis: Secondary | ICD-10-CM | POA: Diagnosis not present

## 2024-02-08 DIAGNOSIS — L720 Epidermal cyst: Secondary | ICD-10-CM | POA: Diagnosis not present

## 2024-02-08 DIAGNOSIS — L814 Other melanin hyperpigmentation: Secondary | ICD-10-CM | POA: Diagnosis not present

## 2024-02-08 DIAGNOSIS — L905 Scar conditions and fibrosis of skin: Secondary | ICD-10-CM | POA: Diagnosis not present

## 2024-02-08 DIAGNOSIS — Z85828 Personal history of other malignant neoplasm of skin: Secondary | ICD-10-CM | POA: Diagnosis not present

## 2024-02-08 DIAGNOSIS — L570 Actinic keratosis: Secondary | ICD-10-CM | POA: Diagnosis not present

## 2024-02-20 DIAGNOSIS — E039 Hypothyroidism, unspecified: Secondary | ICD-10-CM | POA: Diagnosis not present

## 2024-02-20 DIAGNOSIS — B20 Human immunodeficiency virus [HIV] disease: Secondary | ICD-10-CM | POA: Diagnosis not present

## 2024-02-20 DIAGNOSIS — R7301 Impaired fasting glucose: Secondary | ICD-10-CM | POA: Diagnosis not present

## 2024-02-20 DIAGNOSIS — N529 Male erectile dysfunction, unspecified: Secondary | ICD-10-CM | POA: Diagnosis not present

## 2024-02-20 DIAGNOSIS — E7800 Pure hypercholesterolemia, unspecified: Secondary | ICD-10-CM | POA: Diagnosis not present

## 2024-02-20 LAB — LAB REPORT - SCANNED: A1c: 5.6

## 2024-02-20 NOTE — Progress Notes (Signed)
 Labs 02/20/2024:  Total cholesterol 210, triglycerides 50, HDL 60, LDL 140.  Non-HDL cholesterol 150.  A1c 5.6%.

## 2024-02-27 ENCOUNTER — Encounter: Payer: Self-pay | Admitting: Cardiology

## 2024-02-27 ENCOUNTER — Ambulatory Visit: Payer: Medicare Other | Attending: Cardiology | Admitting: Cardiology

## 2024-02-27 VITALS — BP 126/76 | HR 74 | Resp 16 | Ht 74.0 in | Wt 201.4 lb

## 2024-02-27 DIAGNOSIS — Z21 Asymptomatic human immunodeficiency virus [HIV] infection status: Secondary | ICD-10-CM | POA: Insufficient documentation

## 2024-02-27 DIAGNOSIS — E7800 Pure hypercholesterolemia, unspecified: Secondary | ICD-10-CM | POA: Diagnosis not present

## 2024-02-27 DIAGNOSIS — R931 Abnormal findings on diagnostic imaging of heart and coronary circulation: Secondary | ICD-10-CM | POA: Insufficient documentation

## 2024-02-27 NOTE — Progress Notes (Signed)
 Cardiology Office Note:  .   Date:  02/27/2024  ID:  Zachary Levy, DOB 1946-04-03, MRN [id number] PCP: Zachary Garbe, MD  [facility] Providers Cardiologist:  Zachary Decamp, MD   History of Present Illness: .   Zachary Levy is a 78 y.o. Caucasian male patient with history of HIV disease, hypercholesterolemia, elevated coronary calcium score on 04/18/2023 of 686 in the 73rd percentile and aortic atherosclerosis presents to discuss above issues from cardiac standpoint.  Discussed the use of AI scribe software for clinical note transcription with the patient, who gave verbal consent to proceed.  He has   history of well-managed HIV infection presents with concerns about starting statin therapy. The patient's previous primary care physician recommended statins due to a elevated coronary calcium score and cholesterol levels.   The patient is physically active, participating in regular exercise including mountain climbing, and maintains a generally healthy diet. The patient expresses concerns about potential side effects of statins, including muscle problems and cognitive effects. The patient is interested in exploring alternative methods to manage cholesterol levels.  Labs   Lab Results  Component Value Date   CHOL 223 (H) 02/27/2023   HDL 60.10 02/27/2023   LDLCALC 145 (H) 02/27/2023   TRIG 89.0 02/27/2023   CHOLHDL 4 02/27/2023   Lab Results  Component Value Date   NA 138 02/27/2023   K 4.1 02/27/2023   CO2 26 02/27/2023   GLUCOSE 95 02/27/2023   BUN 15 02/27/2023   CREATININE 0.91 02/27/2023   CALCIUM 9.2 02/27/2023   GFR 82.82 02/27/2023      Latest Ref Rng & Units 02/27/2023    2:37 PM 11/01/2022    4:26 PM 08/24/2022    3:35 PM  BMP  Glucose 70 - 99 mg/dL 95  409  811   BUN 6 - 23 mg/dL 15  18  18    Creatinine 0.40 - 1.50 mg/dL 9.14  7.82  9.56   Sodium 135 - 145 mEq/L 138  141  140   Potassium 3.5 - 5.1 mEq/L 4.1  4.3  3.7   Chloride 96 - 112 mEq/L 105  105  107    CO2 19 - 32 mEq/L 26  30  26    Calcium 8.4 - 10.5 mg/dL 9.2  9.4  9.1       Latest Ref Rng & Units 05/08/2023    3:17 PM 11/01/2022    4:26 PM 08/24/2022    3:35 PM  CBC  WBC 4.0 - 10.5 K/uL 4.1  5.4  5.3   Hemoglobin 13.0 - 17.0 g/dL 21.3  08.6  57.8   Hematocrit 39.0 - 52.0 % 43.1  43.0  41.4   Platelets 150.0 - 400.0 K/uL 156.0  204.0  176.0    Lab Results  Component Value Date   HGBA1C 5.8 02/27/2023    Lab Results  Component Value Date   TSH 2.21 05/08/2023    External Labs:  [facility] labs 02/20/2024:  A1c 5.6%.  TSH normal at 2.50.  Total cholesterol 210, triglycerides 50, HDL 60, LDL 140.  Non-HDL 150.  Review of Systems  Cardiovascular:  Negative for chest pain, dyspnea on exertion and leg swelling.   Physical Exam:   VS:  BP 126/76 (BP Location: Left Arm, Patient Position: Sitting, Cuff Size: Normal)   Pulse 74   Resp 16   Ht 6\' 2"  (1.88 m)   Wt 201 lb 6.4 oz (91.4 kg)   SpO2 95%   BMI  25.86 kg/m    Wt Readings from Last 3 Encounters:  02/27/24 201 lb 6.4 oz (91.4 kg)  07/04/23 201 lb (91.2 kg)  05/16/23 193 lb 9.6 oz (87.8 kg)    Physical Exam Neck:     Vascular: No carotid bruit or JVD.  Cardiovascular:     Rate and Rhythm: Normal rate and regular rhythm.     Pulses: Intact distal pulses.     Heart sounds: Murmur heard.     Early systolic murmur is present with a grade of 1/6 at the upper right sternal border.     No gallop.  Pulmonary:     Effort: Pulmonary effort is normal.     Breath sounds: Normal breath sounds.  Abdominal:     General: Bowel sounds are normal.     Palpations: Abdomen is soft.  Musculoskeletal:     Right lower leg: No edema.     Left lower leg: No edema.   Studies Reviewed: Marland Kitchen    Coronary calcium score on 04/18/2023 Total score 686 in the 73rd percentile and aortic atherosclerosis.  Normal aortic size.  EKG:    EKG Interpretation Date/Time:  Tuesday February 27 2024 09:59:05 EDT Ventricular Rate:  68 PR  Interval:  234 QRS Duration:  96 QT Interval:  400 QTC Calculation: 425 R Axis:   -47  Text Interpretation: EKG 02/27/2024: Sinus rhythm metastatically AV block at a rate of 68 bpm, left anterior fascicular block.  No evidence of ischemia. Confirmed by Zachary Levy [phone]) on 02/27/2024 10:04:25 AM    Medications and allergies    No Known Allergies   Current Outpatient Medications:    bictegravir-emtricitabine-tenofovir AF (BIKTARVY) 50-200-25 MG TABS tablet, Take 1 tablet by mouth daily., Disp: , Rfl:    levothyroxine (SYNTHROID) 100 MCG tablet, Take 1 tablet by mouth once daily, Disp: 90 tablet, Rfl: 0   zolpidem (AMBIEN) 5 MG tablet, Take 5 mg by mouth as needed for sleep., Disp: , Rfl:    No orders of the defined types were placed in this encounter.    Medications Discontinued During This Encounter  Medication Reason   aspirin EC 81 MG tablet Patient Preference     ASSESSMENT AND PLAN: .      ICD-10-CM   1. Agatston CAC score, >400: 04/18/2023 Total score of 686 in the MESA  73rd percentile  R93.1 EKG 12-Lead    2. Hypercholesteremia  E78.00     3. Asymptomatic HIV infection, with no history of HIV-related illness (HCC)  Z21       1. Agatston CAC score, >400: 04/18/2023 Total score of 686 in the MESA  73rd percentile (Primary) He engages in high-intensity physical activities without symptoms, suggesting no significant coronary artery disease. Statin therapy is not strongly recommended due to his healthy lifestyle, lack of significant risk factors, and personal preference against statins. Concerns about statin side effects, such as myopathy and cognitive issues, are acknowledged, though these are uncommon. Statins can reduce the risk of myocardial infarction or stroke by 45-50%, but the decision is shared, considering his low risk profile and preference. Alternatives like red yeast rice, flaxseed, and psyllium are discussed, along with Zetia (ezetimibe) or Nexlitol as a  non-statin option, patient will look into this more and medication when he sees his PCP next, he is established with Dr. Guerry Levy as his new PCP.  Take two capsules of red yeast rice at night. Add flaxseed flakes to meals, one to three tablespoons daily. Continue  psyllium fiber intake, preferably after breakfast or dinner, two to three hours after eating.   - EKG 12-Lead  2. Hypercholesteremia As dictated above, external labs reviewed.  3. Asymptomatic HIV infection, with no history of HIV-related illness (HCC) Long-standing HIV infection is well-controlled with antiretroviral therapy. Viral load is undetectable, and CD4 count is within the normal range. Chronic HIV status contributes to a slightly increased cardiovascular risk due to persistent inflammation. Continue current antiretroviral therapy as prescribed by the infectious disease specialist.  Signed,  Zachary Decamp, MD, [facility] 02/27/2024, 10:36 AM [facility] [address] [place], [place] [address] Phone: [phone]. Fax:  [phone]

## 2024-02-27 NOTE — Patient Instructions (Signed)
 Medication Instructions:  Your physician recommends that you continue on your current medications as directed. Please refer to the Current Medication list given to you today.  *If you need a refill on your cardiac medications before your next appointment, please call your pharmacy*  Lab Work: none If you have labs (blood work) drawn today and your tests are completely normal, you will receive your results only by: MyChart Message (if you have MyChart) OR A paper copy in the mail If you have any lab test that is abnormal or we need to change your treatment, we will call you to review the results.  Testing/Procedures: none  Follow-Up: At [facility], you and your health needs are our priority.  As part of our continuing mission to provide you with exceptional heart care, our providers are all part of one team.  This team includes your primary Cardiologist (physician) and Advanced Practice Providers or APPs (Physician Assistants and Nurse Practitioners) who all work together to provide you with the care you need, when you need it.  Your next appointment:   As needed  Provider:   Yates Decamp, MD     We recommend signing up for the patient portal called "MyChart".  Sign up information is provided on this After Visit Summary.  MyChart is used to connect with patients for Virtual Visits (Telemedicine).  Patients are able to view lab/test results, encounter notes, upcoming appointments, etc.  Non-urgent messages can be sent to your provider as well.   To learn more about what you can do with MyChart, go to [email or web address].   Other Instructions       1st Floor: - Lobby - Registration  - Pharmacy  - Lab - Cafe  2nd Floor: - PV Lab - Diagnostic Testing (echo, CT, nuclear med)  3rd Floor: - Vacant  4th Floor: - TCTS (cardiothoracic surgery) - AFib Clinic - Structural Heart Clinic - Vascular Surgery  - Vascular Ultrasound  5th Floor: - [facility]  (general and EP) - Clinical Pharmacy for coumadin, hypertension, lipid, weight-loss medications, and med management appointments    Valet parking services will be available as well.

## 2024-03-05 ENCOUNTER — Inpatient Hospital Stay
Admit: 2024-03-05 | Discharge: 2024-03-05 | Disposition: A | Discharge status: Home / Self Care | Payer: MEDICARE | Attending: Emergency Medicine

## 2024-03-05 ENCOUNTER — Emergency Department: Admit: 2024-03-05 | Payer: MEDICARE | Primary: Family Medicine

## 2024-03-05 DIAGNOSIS — M545 Low back pain, unspecified: Secondary | ICD-10-CM

## 2024-03-05 LAB — CBC WITH AUTO DIFFERENTIAL
Basophils %: 0.5 % (ref 0.0–2.0)
Basophils Absolute: 0 10*3/uL (ref 0.0–0.2)
Eosinophils %: 3.6 % (ref 0.0–7.0)
Eosinophils Absolute: 0.3 10*3/uL (ref 0.0–0.5)
Hematocrit: 49.3 % (ref 38.0–52.0)
Hemoglobin: 16.6 g/dL (ref 13.0–17.3)
Immature Grans (Abs): 0.01 10*3/uL (ref 0.00–0.06)
Immature Granulocytes %: 0.1 % (ref 0.0–0.6)
Lymphocytes Absolute: 2.3 10*3/uL (ref 1.0–3.2)
Lymphocytes: 29.3 % (ref 15.0–45.0)
MCH: 30.7 pg (ref 27.0–34.5)
MCHC: 33.7 g/dL (ref 30.0–36.0)
MCV: 91.1 fL (ref 84.0–100.0)
MPV: 10.1 fL (ref 7.0–12.2)
Monocytes %: 7.4 % (ref 4.0–12.0)
Monocytes Absolute: 0.6 10*3/uL (ref 0.3–1.0)
Neutrophils %: 59.1 % (ref 42.0–74.0)
Neutrophils Absolute: 4.6 10*3/uL (ref 1.6–7.3)
Platelets: 234 10*3/uL (ref 140–440)
RBC: 5.41 x10e6/mcL (ref 4.00–5.60)
RDW: 12.4 % (ref 10.0–17.0)
WBC: 7.8 10*3/uL (ref 3.8–10.6)

## 2024-03-05 LAB — COMPREHENSIVE METABOLIC PANEL
ALT: 24 U/L (ref 0–42)
AST: 23 U/L (ref 0–46)
Albumin/Globulin Ratio: 1.73 (ref 1.00–2.70)
Albumin: 4.1 g/dL (ref 3.5–5.2)
Alk Phosphatase: 76 U/L (ref 40–130)
Anion Gap: 11 mmol/L (ref 2–17)
BUN: 15 mg/dL (ref 8–23)
CO2: 25 mmol/L (ref 22–29)
Calcium: 9.5 mg/dL (ref 8.5–10.7)
Chloride: 102 mmol/L (ref 98–107)
Creatinine: 0.9 mg/dL (ref 0.7–1.3)
Est, Glom Filt Rate: 89 mL/min/1.73mÂ² (ref >=60)
Globulin: 2.4 g/dL (ref 1.9–4.4)
Glucose: 112 mg/dL — ABNORMAL HIGH (ref 70–99)
Osmolaliy Calculated: 277 mosm/kg (ref 270–287)
Potassium: 4.3 mmol/L (ref 3.5–5.3)
Sodium: 138 mmol/L (ref 135–145)
Total Bilirubin: 0.34 mg/dL (ref 0.00–1.20)
Total Protein: 6.5 g/dL (ref 5.7–8.3)

## 2024-03-05 LAB — LIPASE: Lipase: 32 U/L (ref 13–60)

## 2024-03-05 MED ORDER — NAPROXEN 500 MG PO TABS
500 | ORAL_TABLET | Freq: Two times a day (BID) | ORAL | 0 refills | 15.00000 days | Status: DC | PRN
Start: 2024-03-05 — End: 2024-05-31

## 2024-03-05 MED ORDER — CYCLOBENZAPRINE HCL 10 MG PO TABS
10 | ORAL_TABLET | Freq: Three times a day (TID) | ORAL | 0 refills | Status: AC | PRN
Start: 2024-03-05 — End: 2024-03-10

## 2024-03-05 MED ORDER — KETOROLAC TROMETHAMINE 15 MG/ML IJ SOLN
15 | Freq: Once | INTRAMUSCULAR | Status: AC
Start: 2024-03-05 — End: 2024-03-05
  Administered 2024-03-05: 09:00:00 15 mg via INTRAVENOUS

## 2024-03-05 MED FILL — KETOROLAC TROMETHAMINE 15 MG/ML IJ SOLN: 15 MG/ML | INTRAMUSCULAR | Qty: 1

## 2024-03-05 NOTE — ED Notes (Signed)
 I spoke with the patient's wife for follow-up in regards to the ED visit yesterday. She stated he was currently at his doctor's appointment. She reported he was doing a little better; was able to fill his prescriptions and was taking them as prescribed. She had no questions concerning his visit.

## 2024-03-05 NOTE — Discharge Instructions (Signed)
Take medication as directed.  Follow-up with your primary care doctor this week.  Seek immediate care for worsening symptoms or other problems or concerns.

## 2024-03-05 NOTE — ED Provider Notes (Signed)
 [facility] EMERGENCY DEPARTMENT  EMERGENCY DEPARTMENT ENCOUNTER      Pt Name: Oscar Turner  MRN: [id number]  Birthdate October 31, 1946  Date of evaluation: 03/05/2024  Provider: Bernita Bristle, MD    CHIEF COMPLAINT       Chief Complaint   Patient presents with    Back Pain     Mid back pain noticed during a round of golf but denies significant injury onset 02/27/2024. Pain worse on right and reproducible with ambulation. Ambulated to room with steady but guarded gait. Normal pulses, sensation and movement distal to pain. Denies change to bowel or bladder.          HISTORY OF PRESENT ILLNESS    78 year old male comes in with right-sided flank pain for the last week.  Initially thought he may have pulled something but symptoms seem to be worse today.  It is worse with movement and better with rest.  No real radiation.  No urinary symptoms.  No nausea or vomiting.  No fever or chills.  No other new complaints.    The history is provided by the patient and the spouse.       Nursing Notes were reviewed.    REVIEW OF SYSTEMS     Review of Systems   Constitutional: Negative.    HENT: Negative.     Eyes: Negative.    Respiratory: Negative.     Cardiovascular: Negative.    Gastrointestinal: Negative.    Endocrine: Negative.    Genitourinary:  Positive for flank pain. Negative for difficulty urinating, dysuria, enuresis, frequency, penile swelling and scrotal swelling.   Musculoskeletal:  Positive for back pain. Negative for gait problem, neck pain and neck stiffness.   Skin: Negative.    Allergic/Immunologic: Negative.    Neurological: Negative.    Hematological: Negative.    Psychiatric/Behavioral: Negative.         Except as noted above the remainder of the review of systems was reviewed and negative.     PAST MEDICAL HISTORY     Past Medical History:   Diagnosis Date    Complete rotator cuff tear of left shoulder 06/04/2021    S/p repair.    Compression fracture of lumbosacral spine (HCC)  06/04/2021    HLD (hyperlipidemia)     Low testosterone     Melanoma in situ (HCC)     Chest. S/P excision.    Nontraumatic complete tear of right rotator cuff 06/04/2021    S/p repair.    Prostate cancer [facility])     Skin cancer of face     s/p excision. Dr. Almon Jaegers       SURGICAL HISTORY       Past Surgical History:   Procedure Laterality Date    [place]    OTHER SURGICAL HISTORY  08/2014    Compression fracture of L4    PROSTATECTOMY  02/2011    radical    ROTATOR CUFF REPAIR Right 10/11/2018    Dr. Tenna Fees    SHOULDER ARTHROSCOPY W/ ROTATOR CUFF REPAIR Left 05/11/2017    Dr. Alejos Husband    TONSILLECTOMY      WISDOM TOOTH EXTRACTION         CURRENT MEDICATIONS       Previous Medications    COENZYME Q10 10 MG CAPS    Take by mouth    GARLIC 300 MG CAPS    Take by mouth    MULTIPLE  VITAMINS-MINERALS (MULTI FOR HIM) TABS    Take by mouth    OMEGA-3 FATTY ACIDS (FISH OIL) 500 MG CAPS    Take 500 mg by mouth 2 times daily    ROSUVASTATIN (CRESTOR) 20 MG TABLET    Take 1 tablet by mouth daily    SILDENAFIL (VIAGRA) 100 MG TABLET    Take 1 tablet by mouth as needed    TESTOSTERONE CYPIONATE (DEPOTESTOTERONE CYPIONATE) 200 MG/ML INJECTION    Inject 1 mL into the muscle every 14 days for 90 days.    TURMERIC 500 MG CAPS    Take by mouth As directed       ALLERGIES     Patient has no known allergies.    FAMILY HISTORY       Family History   Problem Relation Age of Onset    Kidney Disease Mother     Stroke Father     Lung Cancer Father     Hypertension Father     Gout Father     Hearing Loss Father     Prostate Cancer Father     Alcohol Abuse Brother         deceased    No Known Problems Brother     No Known Problems Daughter     No Known Problems Daughter         SOCIAL HISTORY       Social History     Socioeconomic History    Marital status: Married   Tobacco Use    Smoking status: Former     Types: Pipe     Start date: 1969     Quit date: 2017     Years since quitting: 8.2    Smokeless tobacco: Never    Vaping Use    Vaping status: Never Used   Substance and Sexual Activity    Alcohol use: Yes     Alcohol/week: 7.0 standard drinks of alcohol     Types: 7 Glasses of wine per week     Comment: have a glass of wine every night with dinner    Drug use: Never    Sexual activity: Yes     Partners: Female     Comment: married     Social Drivers of Health     Physical Activity: Sufficiently Active (08/15/2023)    Exercise Vital Sign     Days of Exercise per Week: 4 days     Minutes of Exercise per Session: 120 min       SCREENINGS         Glasgow Coma Scale  Eye Opening: Spontaneous  Best Verbal Response: Oriented  Best Motor Response: Obeys commands  Glasgow Coma Scale Score: 15                     CIWA Assessment  BP: (!) 159/98  Pulse: 56                 PHYSICAL EXAM       ED Triage Vitals [03/05/24 0427]   BP Systolic BP Percentile Diastolic BP Percentile Temp Temp Source Pulse Respirations SpO2   (!) 159/98 -- -- 98.1 F (36.7 C) Oral 56 16 95 %      Height Weight - Scale         1.727 m (5\' 8" ) 74.8 kg (165 lb)             Physical Exam  Vitals reviewed.  Constitutional:       Appearance: Normal appearance.   HENT:      Head: Normocephalic and atraumatic.      Mouth/Throat:      Mouth: Mucous membranes are moist.   Eyes:      Extraocular Movements: Extraocular movements intact.   Cardiovascular:      Rate and Rhythm: Normal rate and regular rhythm.      Pulses: Normal pulses.      Heart sounds: Normal heart sounds.   Pulmonary:      Effort: Pulmonary effort is normal.      Breath sounds: Normal breath sounds.   Abdominal:      General: Abdomen is flat. There is no distension.      Palpations: Abdomen is soft. There is no mass.      Tenderness: There is no abdominal tenderness. There is no guarding.   Musculoskeletal:         General: No deformity or signs of injury.      Cervical back: Normal range of motion and neck supple.      Lumbar back: Tenderness present. No edema, deformity, signs of trauma, lacerations  or bony tenderness. Normal range of motion. Negative right straight leg raise test and negative left straight leg raise test.      Comments: Right-sided paraspinal upper lumbar/lower thoracic soft tissue tenderness.  No midline tenderness, step-off, or deformity.   Skin:     General: Skin is warm and dry.      Capillary Refill: Capillary refill takes less than 2 seconds.   Neurological:      General: No focal deficit present.      Mental Status: He is alert and oriented to person, place, and time.   Psychiatric:         Mood and Affect: Mood normal.         Behavior: Behavior normal.         Procedures    DIAGNOSTIC RESULTS     EKG: All EKG's are interpreted by the Emergency Department Physician who either signs or Co-signs this chart in the absence of a cardiologist.    RADIOLOGY:   CT ABDOMEN PELVIS WO CONTRAST Additional Contrast? None    (Results Pending)       LABS:  Labs Reviewed   COMPREHENSIVE METABOLIC PANEL - Abnormal; Notable for the following components:       Result Value    Glucose 112 (*)     All other components within normal limits   CBC WITH AUTO DIFFERENTIAL   LIPASE   URINALYSIS W/ RFLX MICROSCOPIC       All other labs were within normal range or not returned as of this dictation.    EMERGENCY DEPARTMENT COURSE/REASSESSMENT and MDM:   MDM           FINAL IMPRESSION      1. Acute right-sided low back pain without sciatica          DISPOSITION/PLAN   DISPOSITION Discharge - Pending Orders Complete 03/05/2024 05:38:35 AM   DISPOSITION CONDITION Stable           PATIENT REFERRED TO:  Hurman Horn, MD  [address]  [address]  [place] [address]  [phone]    In 1 week        DISCHARGE MEDICATIONS:  New Prescriptions    CYCLOBENZAPRINE (FLEXERIL) 10 MG TABLET    Take 1 tablet by mouth 3 times daily as  needed for Muscle spasms    NAPROXEN (NAPROSYN) 500 MG TABLET    Take 1 tablet by mouth 2 times daily as needed for Pain     Controlled Substances Monitoring:          No data to display                 (Please note that portions of this note were completed with a voice recognition program.  Efforts were made to edit the dictations but occasionally words are mis-transcribed.)    Bernita Bristle, MD (electronically signed)  Emergency Medicine            Gala Padovano, Amanda Bade, MD  03/05/24 [phone]

## 2024-05-02 ENCOUNTER — Encounter

## 2024-05-21 ENCOUNTER — Ambulatory Visit (INDEPENDENT_AMBULATORY_CARE_PROVIDER_SITE_OTHER): Payer: Medicare Other

## 2024-05-21 VITALS — Ht 74.0 in | Wt 192.0 lb

## 2024-05-21 DIAGNOSIS — Z0000 Encounter for general adult medical examination without abnormal findings: Secondary | ICD-10-CM

## 2024-05-21 NOTE — Patient Instructions (Signed)
 Mr. Laviolette , Thank you for taking time out of your busy schedule to complete your Annual Wellness Visit with me. I enjoyed our conversation and look forward to speaking with you again next year. I, as well as your care team,  appreciate your ongoing commitment to your health goals. Please review the following plan we discussed and let me know if I can assist you in the future. Your Game plan/ To Do List   Follow up Visits: Next Medicare AWV with our clinical staff: declined   Have you seen your provider in the last 6 months (3 months if uncontrolled diabetes)? No Next Office Visit with your provider: patient stated has a new PCP  Clinician Recommendations:  Aim for 30 minutes of exercise or brisk walking, 6-8 glasses of water, and 5 servings of fruits and vegetables each day.       This is a list of the screening recommended for you and due dates:  Health Maintenance  Topic Date Due   COVID-19 Vaccine (4 - 2024-25 season) 07/23/2023   Flu Shot  06/21/2024   Medicare Annual Wellness Visit  05/21/2025   DTaP/Tdap/Td vaccine (3 - Td or Tdap) 05/19/2027   Pneumococcal Vaccine for age over 83  Completed   Hepatitis B Vaccine  Completed   Hepatitis C Screening  Completed   Zoster (Shingles) Vaccine  Completed   HPV Vaccine  Aged Out   Meningitis B Vaccine  Aged Out   Colon Cancer Screening  Discontinued    Advanced directives: (Copy Requested) Please bring a copy of your health care power of attorney and living will to the office to be added to your chart at your convenience. You can mail to [facility] [address] [place], [place] [address] or email to [email or web address] Advance Care Planning is important because it:  [x]  Makes sure you receive the medical care that is consistent with your values, goals, and preferences  [x]  It provides guidance to your family and loved ones and reduces their decisional burden about whether or not they are making the right  decisions based on your wishes.  Follow the link provided in your after visit summary or read over the paperwork we have mailed to you to help you started getting your Advance Directives in place. If you need assistance in completing these, please reach out to us  so that we can help you!

## 2024-05-21 NOTE — Progress Notes (Signed)
 Subjective:   Zachary Levy is a 78 y.o. who presents for a Medicare Wellness preventive visit.  As a reminder, Annual Wellness Visits don't include a physical exam, and some assessments may be limited, especially if this visit is performed virtually. We may recommend an in-person follow-up visit with your provider if needed.  Visit Complete: Virtual I connected with  Lynwood Bull on 05/21/24 by a audio enabled telemedicine application and verified that I am speaking with the correct person using two identifiers.  Patient Location: Home  Provider Location: Office/Clinic  I discussed the limitations of evaluation and management by telemedicine. The patient expressed understanding and agreed to proceed.  Vital Signs: Because this visit was a virtual/telehealth visit, some criteria may be missing or patient reported. Any vitals not documented were not able to be obtained and vitals that have been documented are patient reported.  VideoDeclined- This patient declined Librarian, academic. Therefore the visit was completed with audio only.  Persons Participating in Visit: Patient.  AWV Questionnaire: Yes: Patient Medicare AWV questionnaire was completed by the patient on 05/21/2024 (partially); I have confirmed that all information answered by patient is correct and no changes since this date.  Cardiac Risk Factors include: advanced age (>68men, >36 women);dyslipidemia;male gender     Objective:    Today's Vitals   05/21/24 0815  Weight: 192 lb (87.1 kg)  Height: 6' 2 (1.88 m)   Body mass index is 24.65 kg/m.     05/21/2024    8:16 AM 05/16/2023    8:51 AM 05/10/2022    8:42 AM 02/08/2021    8:51 AM 08/13/2018    3:36 PM 07/07/2017   10:27 AM  Advanced Directives  Does Patient Have a Medical Advance Directive? Yes Yes Yes Yes Yes  Yes   Type of Estate agent of [place];Living will Healthcare Power of [place];Living will Living will  Living will Healthcare Power of [place];Living will Healthcare Power of [place];Living will  Does patient want to make changes to medical advance directive?    No - Patient declined    Copy of Healthcare Power of Attorney in Chart? No - copy requested No - copy requested  No - copy requested No - copy requested  No - copy requested      Data saved with a previous flowsheet row definition    Current Medications (verified) Outpatient Encounter Medications as of 05/21/2024  Medication Sig   bictegravir-emtricitabine-tenofovir AF (BIKTARVY) 50-200-25 MG TABS tablet Take 1 tablet by mouth daily.   levothyroxine  (SYNTHROID ) 100 MCG tablet Take 1 tablet by mouth once daily   zolpidem (AMBIEN) 5 MG tablet Take 5 mg by mouth as needed for sleep.   No facility-administered encounter medications on file as of 05/21/2024.    Allergies (verified) Patient has no known allergies.   History: Past Medical History:  Diagnosis Date   Aortic atherosclerosis (HCC)    Dyslipidemia    History of HIV infection (HCC)    HIV infection (HCC)    HLD (hyperlipidemia)    Hypothyroid    History reviewed. No pertinent surgical history. Family History  Problem Relation Age of Onset   Cancer Sister    Cancer Brother    Social History   Socioeconomic History   Marital status: Divorced    Spouse name: Not on file   Number of children: Not on file   Years of education: Not on file   Highest education level: Master's degree (e.g., MA, MS,  MEng, MEd, MSW, MBA)  Occupational History   Not on file  Tobacco Use   Smoking status: Never   Smokeless tobacco: Never  Vaping Use   Vaping status: Never Used  Substance and Sexual Activity   Alcohol use: Yes    Alcohol/week: 1.0 standard drink of alcohol    Types: 1 Cans of beer per week    Comment: 1-2 glass wine few times weekly   Drug use: No   Sexual activity: Not Currently    Partners: Male  Other Topics Concern   Not on file  Social History Narrative    Not on file   Social Drivers of Health   Financial Resource Strain: Low Risk  (05/21/2024)   Overall Financial Resource Strain (CARDIA)    Difficulty of Paying Living Expenses: Not hard at all  Food Insecurity: No Food Insecurity (05/21/2024)   Hunger Vital Sign    Worried About Running Out of Food in the Last Year: Never true    Ran Out of Food in the Last Year: Never true  Transportation Needs: No Transportation Needs (05/21/2024)   PRAPARE - Administrator, Civil Service (Medical): No    Lack of Transportation (Non-Medical): No  Physical Activity: Sufficiently Active (05/21/2024)   Exercise Vital Sign    Days of Exercise per Week: 6 days    Minutes of Exercise per Session: 100 min  Stress: No Stress Concern Present (05/21/2024)   [employer] of Occupational Health - Occupational Stress Questionnaire    Feeling of Stress: Not at all  Social Connections: Moderately Integrated (05/21/2024)   Social Connection and Isolation Panel    Frequency of Communication with Friends and Family: More than three times a week    Frequency of Social Gatherings with Friends and Family: Three times a week    Attends Religious Services: 1 to 4 times per year    Active Member of Clubs or Organizations: Yes    Attends Engineer, structural: More than 4 times per year    Marital Status: Divorced    Tobacco Counseling Counseling given: No    Clinical Intake:  Pre-visit preparation completed: Yes  Pain : No/denies pain     BMI - recorded: 24.65 Nutritional Status: BMI of 19-24  Normal Nutritional Risks: None Diabetes: No  Lab Results  Component Value Date   HGBA1C 5.8 02/27/2023   HGBA1C 6.0 03/07/2022   HGBA1C 5.7 10/07/2021     How often do you need to have someone help you when you read instructions, pamphlets, or other written materials from your doctor or pharmacy?: 1 - Never  Interpreter Needed?: No  Information entered by :: Verdie Saba,  CMA   Activities of Daily Living     05/21/2024    8:20 AM  In your present state of health, do you have any difficulty performing the following activities:  Hearing? 0  Comment wears hearing aids  Vision? 0  Difficulty concentrating or making decisions? 0  Walking or climbing stairs? 0  Dressing or bathing? 0  Doing errands, shopping? 0  Preparing Food and eating ? N  Using the Toilet? N  In the past six months, have you accidently leaked urine? N  Do you have problems with loss of bowel control? N  Managing your Medications? N  Managing your Finances? N  Housekeeping or managing your Housekeeping? N    Patient Care Team: Tisovec, Charlie ORN, MD as PCP - General (Internal Medicine) Ladona Heinz, MD  as PCP - Cardiology (Cardiology) Robinson Idol, MD as Consulting Physician (Ophthalmology)  I have updated your Care Teams any recent Medical Services you may have received from other providers in the past year.     Assessment:   This is a routine wellness examination for Ahnaf.  Hearing/Vision screen Hearing Screening - Comments:: Wears hearing aids Vision Screening - Comments:: Wears rx glasses - up to date with routine eye exams with Dr Robinson   Goals Addressed               This Visit's Progress     Patient Stated (pt-stated)        Patient stated he plans to continue flying and traveling and bike trip       Depression Screen     05/21/2024    8:22 AM 05/16/2023    8:52 AM 03/06/2023    8:18 AM 05/26/2022    8:38 AM 05/10/2022    8:45 AM 03/07/2022   11:03 AM 02/08/2021    8:50 AM  PHQ 2/9 Scores  PHQ - 2 Score 0 0 0 0 0 0 0  PHQ- 9 Score 0 0         Fall Risk     05/21/2024    8:21 AM 05/16/2023    9:00 AM 05/15/2023   10:09 PM 03/06/2023    8:18 AM 05/10/2022    8:35 AM  Fall Risk   Falls in the past year? 0 0 0 0 1  Number falls in past yr: 0 0 0 0 0  Injury with Fall? 0 0 0 0 0  Risk for fall due to : No Fall Risks No Fall Risks  No Fall Risks   Follow  up Falls evaluation completed;Falls prevention discussed Falls prevention discussed  Falls evaluation completed Falls evaluation completed;Education provided;Falls prevention discussed      Data saved with a previous flowsheet row definition    MEDICARE RISK AT HOME:  Medicare Risk at Home Any stairs in or around the home?: No If so, are there any without handrails?: No Home free of loose throw rugs in walkways, pet beds, electrical cords, etc?: Yes Adequate lighting in your home to reduce risk of falls?: Yes Life alert?: No Use of a cane, walker or w/c?: No Grab bars in the bathroom?: No Shower chair or bench in shower?: No Elevated toilet seat or a handicapped toilet?: No  TIMED UP AND GO:  Was the test performed?  No  Cognitive Function: 6CIT completed        05/21/2024    8:24 AM 05/16/2023    9:00 AM 05/10/2022    8:37 AM  6CIT Screen  What Year? 0 points 0 points 0 points  What month? 0 points 0 points 0 points  What time? 0 points 0 points   Count back from 20 0 points 0 points 0 points  Months in reverse 0 points 0 points 0 points  Repeat phrase 0 points 0 points 0 points  Total Score 0 points 0 points     Immunizations Immunization History  Administered Date(s) Administered   Hep A / Hep B 08/17/2007, 12/13/2007, 03/13/2008   Hepatitis B 11/21/1996, 12/22/1996, 01/19/1997   Influenza Whole 10/23/2006   Influenza, High Dose Seasonal PF 09/05/2017, 08/13/2018, 08/20/2019   Influenza, Seasonal, Injecte, Preservative Fre 07/31/2008, 08/25/2011   Influenza,inj,Quad PF,6+ Mos 08/22/2013, 10/08/2015   Influenza-Unspecified 09/05/2016, 08/04/2021, 08/20/2022   Moderna SARS-COV2 Booster Vaccination 02/27/2023   PFIZER(Purple Top)SARS-COV-2 Vaccination  12/12/2019, 01/02/2020, 08/16/2020   Pneumococcal Conjugate-13 03/13/2014   Pneumococcal Polysaccharide-23 12/23/1998, 10/08/2015, 12/13/2021   Pneumococcal-Unspecified 08/16/2007   Td 05/18/2017   Td (Adult), 2 Lf  Tetanus Toxid, Preservative Free 05/18/2017   Tdap 05/18/2017   Zoster Recombinant(Shingrix) 05/18/2017, 12/19/2017    Screening Tests Health Maintenance  Topic Date Due   COVID-19 Vaccine (4 - 2024-25 season) 07/23/2023   INFLUENZA VACCINE  06/21/2024   Medicare Annual Wellness (AWV)  05/21/2025   DTaP/Tdap/Td (3 - Td or Tdap) 05/19/2027   Pneumococcal Vaccine: 50+ Years  Completed   Hepatitis B Vaccines  Completed   Hepatitis C Screening  Completed   Zoster Vaccines- Shingrix  Completed   HPV VACCINES  Aged Out   Meningococcal B Vaccine  Aged Out   Colonoscopy  Discontinued    Health Maintenance  Health Maintenance Due  Topic Date Due   COVID-19 Vaccine (4 - 2024-25 season) 07/23/2023   Health Maintenance Items Addressed:05/21/2024   Additional Screening:  Vision Screening: Recommended annual ophthalmology exams for early detection of glaucoma and other disorders of the eye. Would you like a referral to an eye doctor? No    Dental Screening: Recommended annual dental exams for proper oral hygiene  Community Resource Referral / Chronic Care Management: CRR required this visit?  No   CCM required this visit?  No   Plan:    I have personally reviewed and noted the following in the patient's chart:   Medical and social history Use of alcohol, tobacco or illicit drugs  Current medications and supplements including opioid prescriptions. Patient is not currently taking opioid prescriptions. Functional ability and status Nutritional status Physical activity Advanced directives List of other physicians Hospitalizations, surgeries, and ER visits in previous 12 months Vitals Screenings to include cognitive, depression, and falls Referrals and appointments  In addition, I have reviewed and discussed with patient certain preventive protocols, quality metrics, and best practice recommendations. A written personalized care plan for preventive services as well as general  preventive health recommendations were provided to patient.   Verdie CHRISTELLA Saba, CMA   05/21/2024   After Visit Summary: (MyChart) Due to this being a telephonic visit, the after visit summary with patients personalized plan was offered to patient via MyChart   Notes: Nothing significant to report at this time.

## 2024-05-26 ENCOUNTER — Inpatient Hospital Stay: Admit: 2024-05-26 | Payer: MEDICARE | Primary: Family Medicine

## 2024-05-26 DIAGNOSIS — Z87891 Personal history of nicotine dependence: Principal | ICD-10-CM

## 2024-05-31 ENCOUNTER — Ambulatory Visit: Admit: 2024-05-31 | Discharge: 2024-05-31 | Payer: MEDICARE | Attending: Family Medicine | Primary: Family Medicine

## 2024-05-31 ENCOUNTER — Ambulatory Visit: Admit: 2024-05-31 | Discharge: 2024-05-31 | Payer: MEDICARE | Primary: Family Medicine

## 2024-05-31 VITALS — BP 152/82 | HR 59 | Ht 68.0 in | Wt 172.0 lb

## 2024-05-31 DIAGNOSIS — R030 Elevated blood-pressure reading, without diagnosis of hypertension: Principal | ICD-10-CM

## 2024-05-31 DIAGNOSIS — M25522 Pain in left elbow: Principal | ICD-10-CM

## 2024-05-31 MED ORDER — ZOLPIDEM TARTRATE 5 MG PO TABS
5 | ORAL_TABLET | Freq: Every evening | ORAL | 0 refills | 30.00000 days | Status: DC | PRN
Start: 2024-05-31 — End: 2024-06-18

## 2024-05-31 MED ORDER — METHYLPREDNISOLONE ACETATE 40 MG/ML IJ SUSP
40 | Freq: Once | INTRAMUSCULAR | Status: AC
Start: 2024-05-31 — End: 2024-05-31
  Administered 2024-05-31: 16:00:00 40 mg via INTRA_ARTICULAR

## 2024-05-31 NOTE — Progress Notes (Signed)
 OFFICE NOTE: Left elbow-pain       CHIEF COMPLAINT:    Chief Complaint   Patient presents with    Follow-up     OFFICE VISIT - left elbow pain dls:07/17/23          HISTORY: Oscar Turner is a English speaking right patient is a well-established patient hand dominant patient that presents today with left elbow pain, who is a 78 y.o. male.  Of our clinic.  He comes in today with an acute on chronic episode of left elbow pain.  Patient reports having injection to his left elbow by Dr. Dow several years ago and has done quite well with it.  He reports the nontraumatic onset of left elbow pain for the past 1 week.  He does not recall an obvious trauma or antecedent but states he does play an excessive amount of tennis which may have irritated his left elbow.  He has attempted activity modification with minimal relief and denies any physical therapy or surgeries to his elbow in the past.  He denies any radicular symptoms.  Past Medical History:   Diagnosis Date    Complete rotator cuff tear of left shoulder 06/04/2021    S/p repair.    Compression fracture of lumbosacral spine (HCC) 06/04/2021    HLD (hyperlipidemia)     Low testosterone      Melanoma in situ (HCC)     Chest. S/P excision.    Nontraumatic complete tear of right rotator cuff 06/04/2021    S/p repair.    Prostate cancer [facility])     Skin cancer of face     s/p excision. Dr. Abron      Past Surgical History:   Procedure Laterality Date    BACK SURGERY      [place]    OTHER SURGICAL HISTORY  08/2014    Compression fracture of L4    PROSTATECTOMY  02/2011    radical    ROTATOR CUFF REPAIR Right 10/11/2018    Dr. Dow    SHOULDER ARTHROSCOPY W/ ROTATOR CUFF REPAIR Left 05/11/2017    Dr. Elsie Dow    TONSILLECTOMY      WISDOM TOOTH EXTRACTION        The patient's allergies, current medications, past family history, past medical history, past social history, past surgical history and problem list were reviewed and updated as appropriate.         Review of Systems -all systems reviewed and are negative except left elbow pain       PHYSICAL EXAM:   There were no vitals filed for this visit.   General Appearance:  Appears Stated Age, Well-Nourished; Oriented x 3; Gait: Ambulating without a limp; Eyes are clear, pupils symmetric and reactive to light; Skin: no lesions, no erythema; Neck: no thyroid enlargement, no jugular venous distension; psych - appropriate       Evaluation of the LEFT ELBOW reveals no atrophy or deformity.  Full range of motion of the elbow with flexion extension supination pronation without difficulty.  Negative hook test.  Moderate point tenderness along the lateral epicondyle.  Negative tenderness across the medial epicondyle.  Negative Tinel's.    Sensory- intact to light touch bilateral     IMAGING:    Xray-left elbow-AP/oblique/lateral- 05/31/2024 :  I personally reviewed the imaging and it demonstrated: Mild degenerative changes noted.  No obvious fracture or dislocation      IMPRESSION: Left elbow-   Diagnosis Orders  1. Left elbow pain  XR ELBOW LEFT (MIN 3 VIEWS)    methylPREDNISolone  acetate (DEPO-MEDROL ) injection 40 mg    DRAIN/INJECT LARGE JOINT/BURSA      2. Lateral epicondylitis, left elbow               PLAN: Due to the patient's history, physical exam and imaging, the patient's symptoms are most consistent with recurrent lateral epicondylitis.  Patient has verbally consented for a left elbow steroid injection to the left lateral epicondyle in the office today.  He will will apply topical Voltaren gel, perform stretching exercises of his left forearm and apply activity modification as needed.  He will follow-up with his as needed.    After discussion of the pros/cons, the benefits and risks of the injection were explained to the patient, consisting of but not limited to: local irritation, fat atrophy/skin discoloration, infection, and flare-up of pain 24 to 48 hours after injection.   Under sterile conditions, and  after anesthetizing the skin with ethyl chloride approximately 1cc's of 0.25% bupivacaine and 1cc of 40mg /ml depo-medrol  were injected into the left elbow lateral epicondyle via a superior approach.. The patient tolerated the procedure well, and was counseled on a possible flare-up over the next 24 to 48 hours.  Expected relief may take up to 1 to 2 weeks.  The patient is to use ice, nonsteroidal anti-inflammatories if tolerated, avoid strenuous activity for the next 1-2 days and follow up in 4 to 6 weeks for repeat exam if symptoms persist.          No follow-ups on file.   Cc: Mathew Etha HERO, MD     Seen and evaluated by Milo Prescott, PA-C and reviewed by Dr. Dow, MD, dictated by Milo Prescott, PA-C      Portions of this note were dictated using Dragon Software. It has been reviewed for accuracy, but may contain grammatical and clerical errors. If there are any questions please feel free to contact me at the office directly.

## 2024-05-31 NOTE — Progress Notes (Signed)
 [facility]  Oscar CHRISTELLA Portugal, MD   [address], [place] [address]  Phone:  [phone]  Fax:  [phone]    CHIEF COMPLAINT:  Chief Complaint   Patient presents with    Medication Request     Patient would like to discuss getting another Ambien  prescription.      HISTORY OF PRESENT ILLNESS:  Mr. Oscar Turner is a pleasant 78 y.o. male  who presents today to request limited prescription for Ambien  for upcoming trip lost multiple time zones.  Patient has tolerated this for the same indication well in the past.  He is in his usual state of health at this time without concern or complaint.  Was seen by orthopedics earlier today for steroid injection in his elbow.  Has had some discomfort there.    Current Outpatient Medications   Medication Sig Dispense Refill    zolpidem  (AMBIEN ) 5 MG tablet Take 1 tablet by mouth nightly as needed for Sleep for up to 30 days. Max Daily Amount: 5 mg 30 tablet 0    rosuvastatin  (CRESTOR ) 20 MG tablet Take 1 tablet by mouth daily 90 tablet 2    sildenafil  (VIAGRA ) 100 MG tablet Take 1 tablet by mouth as needed      Coenzyme Q10 10 MG CAPS Take by mouth      testosterone  cypionate (DEPOTESTOTERONE CYPIONATE) 200 MG/ML injection Inject 1 mL into the muscle every 14 days for 90 days. 2 mL 2    Garlic 300 MG CAPS Take by mouth      Omega-3 Fatty Acids (FISH OIL) 500 MG CAPS Take 500 mg by mouth 2 times daily       No current facility-administered medications for this visit.      Past Medical History:   Diagnosis Date    Complete rotator cuff tear of left shoulder 06/04/2021    S/p repair.    Compression fracture of lumbosacral spine (HCC) 06/04/2021    HLD (hyperlipidemia)     Low testosterone      Melanoma in situ (HCC)     Chest. S/P excision.    Nontraumatic complete tear of right rotator cuff 06/04/2021    S/p repair.    Prostate cancer [facility])     Skin cancer of face     s/p excision. Dr. Abron     REVIEW OF SYSTEMS:  Review of Systems   Constitutional:   Negative for activity change, appetite change, chills, fever and unexpected weight change.   Respiratory:  Negative for cough and shortness of breath.    Cardiovascular:  Negative for chest pain, palpitations and leg swelling.   Gastrointestinal:  Negative for abdominal pain and blood in stool.   Musculoskeletal:  Positive for arthralgias (elbow. See HPI).   Skin:  Negative for rash.   Neurological:  Negative for dizziness, light-headedness and headaches.   Psychiatric/Behavioral:  Negative for dysphoric mood. The patient is not nervous/anxious.    Review of systems is as indicated above and in HPI, otherwise negative.    PHYSICAL EXAM:  Vital Signs:  Vitals:    05/31/24 1148   BP: (!) 152/82   Pulse: 59   SpO2: 98%   Weight: 78 kg (172 lb)   Height: 1.727 m (5' 8)      Physical Exam  Vitals and nursing note reviewed.   Constitutional:       General: He is not in acute distress.  Appearance: Normal appearance. He is not ill-appearing.   HENT:      Head: Normocephalic and atraumatic.   Eyes:      General: No scleral icterus.     Extraocular Movements: Extraocular movements intact.   Cardiovascular:      Rate and Rhythm: Normal rate and regular rhythm.      Pulses: Normal pulses.      Heart sounds: Normal heart sounds. No murmur heard.     No friction rub. No gallop.   Pulmonary:      Effort: Pulmonary effort is normal.      Breath sounds: Normal breath sounds.   Abdominal:      General: Bowel sounds are normal.      Palpations: Abdomen is soft.   Musculoskeletal:      Cervical back: Normal range of motion.   Skin:     General: Skin is warm and dry.   Neurological:      General: No focal deficit present.      Mental Status: He is alert and oriented to person, place, and time. Mental status is at baseline.   Psychiatric:         Mood and Affect: Mood normal.         Behavior: Behavior normal.         Thought Content: Thought content normal.         Judgment: Judgment normal.          PHQ:      05/29/2024     1:46 PM    PHQ-9    Little interest or pleasure in doing things 0   Feeling down, depressed, or hopeless 0   PHQ-2 Score 0    PHQ-9 Total Score 0        Patient-reported       Admission on 03/05/2024, Discharged on 03/05/2024   Component Date Value Ref Range Status    WBC 03/05/2024 7.8  3.8 - 10.6 x10e3/mcL Final    RBC 03/05/2024 5.41  4.00 - 5.60 x10e6/mcL Final    Hemoglobin 03/05/2024 16.6  13.0 - 17.3 g/dL Final    Hematocrit 95/84/7974 49.3  38.0 - 52.0 % Final    MCV 03/05/2024 91.1  84.0 - 100.0 fL Final    MCH 03/05/2024 30.7  27.0 - 34.5 pg Final    MCHC 03/05/2024 33.7  30.0 - 36.0 g/dL Final    RDW 95/84/7974 12.4  10.0 - 17.0 % Final    Platelets 03/05/2024 234  140 - 440 x10e3/mcL Final    MPV 03/05/2024 10.1  7.0 - 12.2 fL Final    Neutrophils % 03/05/2024 59.1  42.0 - 74.0 % Final    Lymphocytes 03/05/2024 29.3  15.0 - 45.0 % Final    Monocytes % 03/05/2024 7.4  4.0 - 12.0 % Final    Eosinophils % 03/05/2024 3.6  0.0 - 7.0 % Final    Basophils % 03/05/2024 0.5  0.0 - 2.0 % Final    Neutrophils Absolute 03/05/2024 4.6  1.6 - 7.3 x10e3/mcL Final    Lymphocytes Absolute 03/05/2024 2.3  1.0 - 3.2 x10e3/mcL Final    Monocytes Absolute 03/05/2024 0.6  0.3 - 1.0 x10e3/mcL Final    Eosinophils Absolute 03/05/2024 0.3  0.0 - 0.5 x10e3/mcL Final    Basophils Absolute 03/05/2024 0.0  0.0 - 0.2 x10e3/mcL Final    Immature Granulocytes % 03/05/2024 0.1  0.0 - 0.6 % Final    Immature Grans (Abs) 03/05/2024 0.01  0.00 - 0.06 x10e3/mcL Final    Sodium 03/05/2024 138  135 - 145 mmol/L Final    Potassium 03/05/2024 4.3  3.5 - 5.3 mmol/L Final    Chloride 03/05/2024 102  98 - 107 mmol/L Final    CO2 03/05/2024 25  22 - 29 mmol/L Final    Glucose 03/05/2024 112 (H)  70 - 99 mg/dL Final    BUN 95/84/7974 15  8 - 23 mg/dL Final    Creatinine 95/84/7974 0.9  0.7 - 1.3 mg/dL Final    Anion Gap 95/84/7974 11  2 - 17 mmol/L Final    Osmolaliy Calculated 03/05/2024 277  270 - 287 mOsm/kg Final    Calcium  03/05/2024 9.5  8.5 - 10.7  mg/dL Final    Total Protein 03/05/2024 6.5  5.7 - 8.3 g/dL Final    Albumin 95/84/7974 4.1  3.5 - 5.2 g/dL Final    Globulin 95/84/7974 2.4  1.9 - 4.4 g/dL Final    Albumin/Globulin Ratio 03/05/2024 1.73  1.00 - 2.70 Final    Total Bilirubin 03/05/2024 0.34  0.00 - 1.20 mg/dL Final    Alk Phosphatase 03/05/2024 76  40 - 130 unit/L Final    AST 03/05/2024 23  0 - 46 unit/L Final    ALT 03/05/2024 24  0 - 42 unit/L Final    Est, Glom Filt Rate 03/05/2024 89  >=60 mL/min/1.77m Final    Comment: VERIFIED by Discern Expert.  GFR Interpretation:                                                                         % OF  KIDNEY  GFR                                                        STAGE  FUNCTION  ==================================================================================    > 90        Normal kidney function                       STAGE 1  90-100%  89 to 60      Mild loss of kidney function                 STAGE 2  80-60%  59 to 45      Mild to moderate loss of kidney function     STAGE 3a  59-45%  44 to 30      Moderate to severe loss of kidney function   STAGE 3b  44-30%  29 to 15      Severe loss of kidney function               STAGE 4  29-15%    < 15        Kidney failure                               STAGE 5  <15%  ==================================================================================  Modified from [employer]  GFR Calculation performed using the CKD-EPI 2021 equation developed for use  with IDMS traceable creatinine methods and                            is the calculation recommended by  the [facility] for estimating GFR in adults.      Lipase 03/05/2024 32  13 - 60 unit/L Final    Comment: Effective 09/19/15 Lipase Methodology Change - Lipase Reference Range is  specific for the Roche Enzymatic colorimetric method.    Reference range changed on 12/18/15 from 3-300 unit/L to 13-60 unit/L          The ASCVD Risk score (Arnett DK, et al., 2019) failed to  calculate for the following reasons:    The valid total cholesterol range is 130 to 320 mg/dL     IMPRESSION/PLAN:  1. Elevated blood pressure reading in office without diagnosis of hypertension  Patient has had 2 consecutive elevated blood pressure readings, but during both episodes, he was having some form of musculoskeletal pain.  He will monitor his blood pressure at home and notify us  if he regularly notes blood pressure readings greater than 140/80.  We will again check his blood pressure at his upcoming appointment in September.  Will avoid starting medication at this time given circumstances during readings.    2. Patient travels  Will approve 30-day prescription of Ambien  to allow for prolonged travel.  Patient aware of age-related risks and wishes to continue.  - zolpidem  (AMBIEN ) 5 MG tablet; Take 1 tablet by mouth nightly as needed for Sleep for up to 30 days. Max Daily Amount: 5 mg  Dispense: 30 tablet; Refill: 0        Oscar CHRISTELLA Portugal, MD  I confirm that I have managed the broad scope of the patient's health needs by furnishing care for some or all the patient's acute and/or chronic conditions across a spectrum of diagnoses and organ systems that will require ongoing care with myself or someone on my team.    This note was typed and/or dictated by Dragon Naturally Speaking at the point-of-care.  Reasonable attempt at proofreading has been made, however, occasional wrong-word or `sound-a-like substitutions persist due to the inherent limitations of voice recognition software.  The intent is to have a complete and accurate medical record.  As a valued partner in this safety effort, if you have noted factual errors, please complete the Health Information Amendment/Correct Form or call the [facility] Health Information Management Office at [phone]. Please notify the dino if any discrepancies are noted or if the meaning of any statement is unclear.

## 2024-06-03 ENCOUNTER — Encounter

## 2024-06-03 MED ORDER — LOSARTAN POTASSIUM 25 MG PO TABS
25 | ORAL_TABLET | Freq: Every day | ORAL | 0 refills | 90.00000 days | Status: DC
Start: 2024-06-03 — End: 2024-06-18

## 2024-06-18 ENCOUNTER — Ambulatory Visit: Admit: 2024-06-18 | Discharge: 2024-06-18 | Payer: MEDICARE | Attending: Family Medicine | Primary: Family Medicine

## 2024-06-18 VITALS — BP 118/70 | HR 65 | Ht 68.0 in | Wt 173.0 lb

## 2024-06-18 DIAGNOSIS — I10 Essential (primary) hypertension: Principal | ICD-10-CM

## 2024-06-18 MED ORDER — LOSARTAN POTASSIUM 25 MG PO TABS
25 | ORAL_TABLET | Freq: Every day | ORAL | 2 refills | 90.00000 days | Status: DC
Start: 2024-06-18 — End: 2024-07-01

## 2024-06-18 NOTE — Progress Notes (Signed)
 [facility]  [facility]  [name], MD   [address] [place] [address]  Phone:  [phone]  Fax:  [phone]    CHIEF COMPLAINT:  Chief Complaint   Patient presents with    Follow-up     2 week    Hypertension     Patient states that after starting the new medication he was monitoring his BP and the readings have ranged in the 140's over 80's, with a few 130's. Patients has been using a wrist cuff.        HISTORY OF PRESENT ILLNESS:  Mr. Gerrard is a pleasant 78 y.o. male  who presents today for BP recheck after recent prescription of losartan .  Patient reports home blood pressure measurements have been elevated and he was worried that we would need to increase his medication dosage further.  Instead, it appears that he has been using a wrist cuff and his measurements have been inaccurate.  He is asymptomatic and his blood pressure in the office is within normal limits.  He has no other acute concerns or complaints today.    Current Outpatient Medications   Medication Sig Dispense Refill    losartan  (COZAAR ) 25 MG tablet Take 1 tablet by mouth daily 90 tablet 2    rosuvastatin  (CRESTOR ) 20 MG tablet Take 1 tablet by mouth daily 90 tablet 2    sildenafil  (VIAGRA ) 100 MG tablet Take 1 tablet by mouth as needed      Coenzyme Q10 10 MG CAPS Take by mouth      testosterone  cypionate (DEPOTESTOTERONE CYPIONATE) 200 MG/ML injection Inject 1 mL into the muscle every 14 days for 90 days. 2 mL 2    Garlic 300 MG CAPS Take by mouth      Omega-3 Fatty Acids (FISH OIL) 500 MG CAPS Take 500 mg by mouth 2 times daily       No current facility-administered medications for this visit.        Past Medical History:   Diagnosis Date    Complete rotator cuff tear of left shoulder 06/04/2021    S/p repair.    Compression fracture of lumbosacral spine (HCC) 06/04/2021    HLD (hyperlipidemia)     Low testosterone      Melanoma in situ (HCC)     Chest. S/P excision.     Nontraumatic complete tear of right rotator cuff 06/04/2021    S/p repair.    Prostate cancer [facility])     Skin cancer of face     s/p excision. Dr. Abron       REVIEW OF SYSTEMS:  Review of Systems   Respiratory:  Negative for shortness of breath.    Cardiovascular:  Negative for chest pain and palpitations.   Neurological:  Negative for dizziness and light-headedness.   Review of systems is as indicated above and in HPI, otherwise negative.    PHYSICAL EXAM:  Vital Signs:  Vitals:    06/18/24 1605   BP: 118/70   Pulse: 65   SpO2: 98%   Weight: 78.5 kg (173 lb)   Height: 1.727 m (5' 8)        Physical Exam  Vitals and nursing note reviewed.   Constitutional:       General: He is not in acute distress.     Appearance: Normal appearance. He is not ill-appearing.   HENT:      Head: Normocephalic  and atraumatic.   Eyes:      General: No scleral icterus.     Extraocular Movements: Extraocular movements intact.   Cardiovascular:      Rate and Rhythm: Normal rate and regular rhythm.      Pulses: Normal pulses.      Heart sounds: Normal heart sounds. No murmur heard.     No friction rub. No gallop.   Pulmonary:      Effort: Pulmonary effort is normal.      Breath sounds: No wheezing, rhonchi or rales.   Abdominal:      General: Bowel sounds are normal.   Musculoskeletal:      Cervical back: Normal range of motion.   Skin:     General: Skin is warm and dry.   Neurological:      General: No focal deficit present.      Mental Status: He is alert and oriented to person, place, and time. Mental status is at baseline.   Psychiatric:         Mood and Affect: Mood normal.         Behavior: Behavior normal.         Thought Content: Thought content normal.         Judgment: Judgment normal.          PHQ:      05/29/2024     1:46 PM   PHQ-9    Little interest or pleasure in doing things 0   Feeling down, depressed, or hopeless 0   PHQ-2 Score 0    PHQ-9 Total Score 0        Patient-reported       No visits with results within 3 Month(s)  from this visit.   Latest known visit with results is:   Admission on 03/05/2024, Discharged on 03/05/2024   Component Date Value Ref Range Status    WBC 03/05/2024 7.8  3.8 - 10.6 x10e3/mcL Final    RBC 03/05/2024 5.41  4.00 - 5.60 x10e6/mcL Final    Hemoglobin 03/05/2024 16.6  13.0 - 17.3 g/dL Final    Hematocrit 95/84/7974 49.3  38.0 - 52.0 % Final    MCV 03/05/2024 91.1  84.0 - 100.0 fL Final    MCH 03/05/2024 30.7  27.0 - 34.5 pg Final    MCHC 03/05/2024 33.7  30.0 - 36.0 g/dL Final    RDW 95/84/7974 12.4  10.0 - 17.0 % Final    Platelets 03/05/2024 234  140 - 440 x10e3/mcL Final    MPV 03/05/2024 10.1  7.0 - 12.2 fL Final    Neutrophils % 03/05/2024 59.1  42.0 - 74.0 % Final    Lymphocytes 03/05/2024 29.3  15.0 - 45.0 % Final    Monocytes % 03/05/2024 7.4  4.0 - 12.0 % Final    Eosinophils % 03/05/2024 3.6  0.0 - 7.0 % Final    Basophils % 03/05/2024 0.5  0.0 - 2.0 % Final    Neutrophils Absolute 03/05/2024 4.6  1.6 - 7.3 x10e3/mcL Final    Lymphocytes Absolute 03/05/2024 2.3  1.0 - 3.2 x10e3/mcL Final    Monocytes Absolute 03/05/2024 0.6  0.3 - 1.0 x10e3/mcL Final    Eosinophils Absolute 03/05/2024 0.3  0.0 - 0.5 x10e3/mcL Final    Basophils Absolute 03/05/2024 0.0  0.0 - 0.2 x10e3/mcL Final    Immature Granulocytes % 03/05/2024 0.1  0.0 - 0.6 % Final    Immature Grans (Abs) 03/05/2024 0.01  0.00 - 0.06  x10e3/mcL Final    Sodium 03/05/2024 138  135 - 145 mmol/L Final    Potassium 03/05/2024 4.3  3.5 - 5.3 mmol/L Final    Chloride 03/05/2024 102  98 - 107 mmol/L Final    CO2 03/05/2024 25  22 - 29 mmol/L Final    Glucose 03/05/2024 112 (H)  70 - 99 mg/dL Final    BUN 95/84/7974 15  8 - 23 mg/dL Final    Creatinine 95/84/7974 0.9  0.7 - 1.3 mg/dL Final    Anion Gap 95/84/7974 11  2 - 17 mmol/L Final    Osmolaliy Calculated 03/05/2024 277  270 - 287 mOsm/kg Final    Calcium  03/05/2024 9.5  8.5 - 10.7 mg/dL Final    Total Protein 03/05/2024 6.5  5.7 - 8.3 g/dL Final    Albumin 95/84/7974 4.1  3.5 - 5.2 g/dL Final     Globulin 95/84/7974 2.4  1.9 - 4.4 g/dL Final    Albumin/Globulin Ratio 03/05/2024 1.73  1.00 - 2.70 Final    Total Bilirubin 03/05/2024 0.34  0.00 - 1.20 mg/dL Final    Alk Phosphatase 03/05/2024 76  40 - 130 unit/L Final    AST 03/05/2024 23  0 - 46 unit/L Final    ALT 03/05/2024 24  0 - 42 unit/L Final    Est, Glom Filt Rate 03/05/2024 89  >=60 mL/min/1.63m Final    Comment: VERIFIED by Discern Expert.  GFR Interpretation:                                                                         % OF  KIDNEY  GFR                                                        STAGE  FUNCTION  ==================================================================================    > 90        Normal kidney function                       STAGE 1  90-100%  89 to 60      Mild loss of kidney function                 STAGE 2  80-60%  59 to 45      Mild to moderate loss of kidney function     STAGE 3a  59-45%  44 to 30      Moderate to severe loss of kidney function   STAGE 3b  44-30%  29 to 15      Severe loss of kidney function               STAGE 4  29-15%    < 15        Kidney failure                               STAGE 5  <15%  ==================================================================================  Modified from [employer]    GFR  Calculation performed using the CKD-EPI 2021 equation developed for use  with IDMS traceable creatinine methods and                            is the calculation recommended by  the [facility] for estimating GFR in adults.      Lipase 03/05/2024 32  13 - 60 unit/L Final    Comment: Effective 09/19/15 Lipase Methodology Change - Lipase Reference Range is  specific for the Roche Enzymatic colorimetric method.    Reference range changed on 12/18/15 from 3-300 unit/L to 13-60 unit/L          The ASCVD Risk score (Arnett DK, et al., 2019) failed to calculate for the following reasons:    The valid total cholesterol range is 130 to 320 mg/dL      IMPRESSION/PLAN:  1. Primary hypertension  Blood pressure well-controlled based on in office reading and patient's symptoms.  Continue losartan  at current dosage.  Follow-up as scheduled on 08/16/2024.  Patient to obtain arm cuff in the interim and use this to take his blood pressure at home.  He will return with home blood pressure log.  Follow-up as needed in the interim.  - losartan  (COZAAR ) 25 MG tablet; Take 1 tablet by mouth daily  Dispense: 90 tablet; Refill: 2        [name], MD  I confirm that I have managed the broad scope of the patient's health needs by furnishing care for some or all the patient's acute and/or chronic conditions across a spectrum of diagnoses and organ systems that will require ongoing care with myself or someone on my team.    This note was typed and/or dictated by Dragon Naturally Speaking at the point-of-care.  Reasonable attempt at proofreading has been made, however, occasional wrong-word or `sound-a-like substitutions persist due to the inherent limitations of voice recognition software.  The intent is to have a complete and accurate medical record.  As a valued partner in this safety effort, if you have noted factual errors, please complete the Health Information Amendment/Correct Form or call the [facility] Health Information Management Office at [phone]. Please notify the dino if any discrepancies are noted or if the meaning of any statement is unclear.

## 2024-06-28 ENCOUNTER — Other Ambulatory Visit: Payer: Self-pay | Admitting: Internal Medicine

## 2024-07-01 ENCOUNTER — Encounter

## 2024-07-01 MED ORDER — LOSARTAN POTASSIUM 25 MG PO TABS
25 | ORAL_TABLET | Freq: Every day | ORAL | 2 refills | 90.00000 days | Status: AC
Start: 2024-07-01 — End: ?

## 2024-07-01 MED ORDER — METHYLPREDNISOLONE 4 MG PO TBPK
4 | PACK | ORAL | 0 refills | 6.00000 days | Status: AC
Start: 2024-07-01 — End: 2024-07-07

## 2024-07-01 NOTE — Telephone Encounter (Signed)
 Rx pend LOV 06/18/24 medication was loaded but not sent to the pharmacy.

## 2024-07-01 NOTE — Progress Notes (Signed)
 Sending patient medrol  dose pack for recurrent elbow pain

## 2024-07-03 DIAGNOSIS — N401 Enlarged prostate with lower urinary tract symptoms: Secondary | ICD-10-CM | POA: Diagnosis not present

## 2024-07-03 DIAGNOSIS — R351 Nocturia: Secondary | ICD-10-CM | POA: Diagnosis not present

## 2024-07-03 DIAGNOSIS — N5201 Erectile dysfunction due to arterial insufficiency: Secondary | ICD-10-CM | POA: Diagnosis not present

## 2024-07-09 DIAGNOSIS — Z5181 Encounter for therapeutic drug level monitoring: Secondary | ICD-10-CM | POA: Diagnosis not present

## 2024-07-09 DIAGNOSIS — Z113 Encounter for screening for infections with a predominantly sexual mode of transmission: Secondary | ICD-10-CM | POA: Diagnosis not present

## 2024-07-09 DIAGNOSIS — B20 Human immunodeficiency virus [HIV] disease: Secondary | ICD-10-CM | POA: Diagnosis not present

## 2024-07-09 DIAGNOSIS — Z9189 Other specified personal risk factors, not elsewhere classified: Secondary | ICD-10-CM | POA: Diagnosis not present

## 2024-07-09 DIAGNOSIS — Z79899 Other long term (current) drug therapy: Secondary | ICD-10-CM | POA: Diagnosis not present

## 2024-07-18 DIAGNOSIS — E291 Testicular hypofunction: Secondary | ICD-10-CM | POA: Diagnosis not present

## 2024-07-18 DIAGNOSIS — B20 Human immunodeficiency virus [HIV] disease: Secondary | ICD-10-CM | POA: Diagnosis not present

## 2024-07-18 DIAGNOSIS — N529 Male erectile dysfunction, unspecified: Secondary | ICD-10-CM | POA: Diagnosis not present

## 2024-07-18 DIAGNOSIS — E039 Hypothyroidism, unspecified: Secondary | ICD-10-CM | POA: Diagnosis not present

## 2024-07-29 ENCOUNTER — Encounter

## 2024-07-29 MED ORDER — COVID-19 MRNA VAC-TRIS(PFIZER) 30 MCG/0.3ML IM SUSP
30 | Freq: Once | INTRAMUSCULAR | 0 refills | Status: DC
Start: 2024-07-29 — End: 2024-08-05

## 2024-08-04 ENCOUNTER — Encounter

## 2024-08-05 MED ORDER — COMIRNATY 30 MCG/0.3ML IM SUSY
30 | INTRAMUSCULAR | 0 refills | 1.00000 days | Status: AC
Start: 2024-08-05 — End: ?

## 2024-08-07 ENCOUNTER — Inpatient Hospital Stay: Admit: 2024-08-07 | Payer: MEDICARE | Primary: Family Medicine

## 2024-08-07 DIAGNOSIS — E782 Mixed hyperlipidemia: Secondary | ICD-10-CM

## 2024-08-07 LAB — HEPATIC FUNCTION PANEL
ALT: 31 U/L (ref 0–42)
AST: 23 U/L (ref 0–46)
Albumin: 4.3 g/dL (ref 3.5–5.2)
Alk Phosphatase: 67 U/L (ref 40–130)
Bilirubin, Direct: <0.2 mg/dL (ref 0.00–0.30)
Total Bilirubin: 0.48 mg/dL (ref 0.00–1.20)
Total Protein: 6.5 g/dL (ref 5.7–8.3)

## 2024-08-07 LAB — TESTOSTERONE: Testosterone: 481 ng/dL (ref 193.0–740.0)

## 2024-08-07 LAB — LIPID PANEL
Chol/HDL Ratio: 2.3 (ref 0.0–4.4)
Cholesterol, Total: 124 mg/dL (ref 100–200)
HDL: 55 mg/dL (ref >=40)
LDL Cholesterol: 56 mg/dL (ref 0.0–100.0)
LDL/HDL Ratio: 1
Triglycerides: 66 mg/dL (ref 0–149)
VLDL: 13.2 mg/dL (ref 5.0–40.0)

## 2024-08-07 LAB — CBC
Hematocrit: 49 % (ref 38.0–52.0)
Hemoglobin: 16.5 g/dL (ref 13.0–17.3)
MCH: 31.6 pg (ref 27.0–34.5)
MCHC: 33.7 g/dL (ref 30.0–36.0)
MCV: 93.9 fL (ref 84.0–100.0)
MPV: 9.8 fL (ref 7.0–12.2)
Platelets: 204 x10e3/mcL (ref 140–440)
RBC: 5.22 x10e6/mcL (ref 4.00–5.60)
RDW: 12.9 % (ref 10.0–17.0)
WBC: 4.6 x10e3/mcL (ref 3.8–10.6)

## 2024-08-07 LAB — PSA, DIAGNOSTIC: PSA: 0.064 ng/mL (ref 0.000–4.000)

## 2024-08-16 ENCOUNTER — Ambulatory Visit: Admit: 2024-08-16 | Discharge: 2024-08-16 | Payer: MEDICARE | Attending: Physician Assistant | Primary: Family Medicine

## 2024-08-16 VITALS — BP 130/76 | HR 57 | Ht 68.0 in | Wt 170.0 lb

## 2024-08-16 DIAGNOSIS — Z0000 Encounter for general adult medical examination without abnormal findings: Principal | ICD-10-CM

## 2024-08-16 NOTE — Patient Instructions (Signed)
 A Healthy Heart: Care Instructions  Overview    Coronary artery disease, also called heart disease, occurs when a substance called plaque builds up in the vessels that supply oxygen-rich blood to your heart muscle. This can narrow the blood vessels and reduce blood flow. A heart attack happens when blood flow is completely blocked. A high-fat diet, smoking, and other factors increase the risk of heart disease.  Your doctor has found that you have a chance of having heart disease. A heart-healthy lifestyle can help keep your heart healthy and prevent heart disease. This lifestyle includes eating healthy, being active, staying at a weight that's healthy for you, and not smoking, vaping, or using other tobacco or nicotine products. It also includes taking medicines as directed, managing other health conditions, and trying to get a healthy amount of sleep.  Follow-up care is a key part of your treatment and safety. Be sure to make and go to all appointments, and contact your doctor if you are having problems. It's also a good idea to know your test results and keep a list of the medicines you take.  How can you care for yourself at home?  Diet  Use less salt when you cook and eat. This helps lower your blood pressure. Taste food before salting. Add only a little salt when you think you need it. With time, your taste buds will adjust to less salt.  Eat fewer snack items, fast foods, canned soups, and other high-salt, high-fat, processed foods.  Read food labels and try to avoid saturated and trans fats. They increase your risk of heart disease by raising cholesterol levels.  Limit the amount of solid fat--butter, margarine, and shortening--you eat. Use olive, peanut, or canola oil when you cook. Bake, broil, and steam foods instead of frying them.  Eat a variety of fruit and vegetables every day. Dark green, deep orange, red, or yellow fruits and vegetables are especially good for you. Examples include spinach,  carrots, peaches, and berries.  Foods high in fiber can reduce your cholesterol and provide important vitamins and minerals. High-fiber foods include whole-grain cereals and breads, oatmeal, beans, brown rice, citrus fruits, and apples.  Eat lean proteins. Heart-healthy proteins include seafood, lean meats and poultry, eggs, beans, peas, nuts, seeds, and soy products.  Limit drinks and foods with added sugar. These include candy, desserts, and soda pop.  Heart-healthy lifestyle  If your doctor recommends it, get more exercise. For many people, walking is a good choice. Or you may want to swim, bike, or do other activities. Bit by bit, increase the time you're active every day. Try for at least 30 minutes on most days of the week.  If you smoke, vape, or use other tobacco or nicotine products, try to quit. If you can't quit, cut back as much as you can. If you need help quitting, talk to your doctor about quit programs and medicines. Quitting is one of the most important things you can do to protect your heart. Also avoid secondhand smoke and the aerosol mist from vaping.  Stay at a weight that's healthy for you. Talk to your doctor if you need help losing weight.  Try to get 7 to 9 hours of sleep each night.  Limit alcohol to 2 drinks a day for men and 1 drink a day for women. Too much alcohol can cause health problems.  Manage other health problems such as diabetes, high blood pressure, and high cholesterol. If you think you may have  a problem with alcohol or drug use, talk to your doctor.  Medicines  Take your medicines exactly as prescribed. Contact your doctor if you think you are having a problem with your medicine.  When should you call for help?  Call 911 if you have symptoms of a heart attack. These may include:  Chest pain or pressure, or a strange feeling in the chest.  Sweating.  Shortness of breath.  Pain, pressure, or a strange feeling in the back, neck, jaw, or upper belly or in one or both shoulders  or arms.  Lightheadedness or sudden weakness.  A fast or irregular heartbeat.  After you call 911, the operator may tell you to chew 1 adult-strength or 2 to 4 low-dose aspirin. Wait for an ambulance. Do not try to drive yourself.  Watch closely for changes in your health, and be sure to contact your doctor if you have any problems.  Where can you learn more?  Go to [employer] and enter F075 to learn more about A Healthy Heart: Care Instructions.  Current as of: June 21, 2023  Content Version: 14.6   2024-2025 [place], [place].   Care instructions adapted under license by [facility]. If you have questions about a medical condition or this instruction, always ask your healthcare professional. [name], [facility], disclaims any warranty or liability for your use of this information.    Personalized Preventive Plan for Oscar Turner - 08/16/2024  Medicare offers a range of preventive health benefits. Some of the tests and screenings are paid in full while other may be subject to a deductible, co-insurance, and/or copay.  Some of these benefits include a comprehensive review of your medical history including lifestyle, illnesses that may run in your family, and various assessments and screenings as appropriate.  After reviewing your medical record and screening and assessments performed today your provider may have ordered immunizations, labs, imaging, and/or referrals for you.  A list of these orders (if applicable) as well as your Preventive Care list are included within your After Visit Summary for your review.

## 2024-08-16 NOTE — Progress Notes (Signed)
 Medicare Annual Wellness Visit    Oscar Turner is here for Medicare AWV    Assessment & Plan   Medicare annual wellness visit, subsequent- and had lipid panel done    Hyperlipidemia LDL goal <70- controlled with Crestor  daily       Return in about 6 months (around 02/13/2025), or with Dr Mathew.     Subjective   The following acute and/or chronic problems were also addressed today:  Hyperlipidemia and BP    Patient's complete Health Risk Assessment and screening values have been reviewed and are found in Flowsheets. The following problems were reviewed today and where indicated follow up appointments were made and/or referrals ordered.    No Positive Risk Factors identified today.                                     Objective   Vitals:    08/16/24 0809   BP: 130/76   Pulse: 57   SpO2: 96%   Weight: 77.1 kg (170 lb)   Height: 1.727 m (5' 8)      Body mass index is 25.85 kg/m.        Physical Exam  Vitals reviewed.   Constitutional:       General: He is not in acute distress.     Appearance: Normal appearance. He is not ill-appearing, toxic-appearing or diaphoretic.   HENT:      Head: Normocephalic and atraumatic.   Eyes:      Conjunctiva/sclera: Conjunctivae normal.   Cardiovascular:      Rate and Rhythm: Regular rhythm. Bradycardia present.      Pulses: Normal pulses.      Heart sounds: Normal heart sounds.   Pulmonary:      Effort: Pulmonary effort is normal. No respiratory distress.      Breath sounds: Normal breath sounds. No stridor. No wheezing, rhonchi or rales.   Chest:      Chest wall: No tenderness.   Musculoskeletal:         General: No swelling or signs of injury.      Cervical back: Normal range of motion. No rigidity.   Skin:     General: Skin is warm and dry.      Findings: No rash.   Neurological:      General: No focal deficit present.      Mental Status: He is alert and oriented to person, place, and time.      Motor: No weakness.      Coordination: Coordination normal.   Psychiatric:         Mood  and Affect: Mood normal.         Behavior: Behavior normal.         Thought Content: Thought content normal.         Judgment: Judgment normal.               No Known Allergies  Prior to Visit Medications   Medication Sig Taking? Authorizing Provider   COVID-19 mRNA Vac-TriS,Pfizer, (COMIRNATY ) 30 MCG/0.3ML SUSY INJECT 0.3 ML IN AS DIRECTED FOR SINGLE DOSE Yes Curtistine Powell Bottcher, PA   losartan  (COZAAR ) 25 MG tablet Take 1 tablet by mouth daily Yes Curtistine Powell Bottcher, PA   rosuvastatin  (CRESTOR ) 20 MG tablet Take 1 tablet by mouth daily Yes Mathew Etha HERO, MD   sildenafil  (VIAGRA ) 100 MG tablet Take 1 tablet by  mouth as needed Yes [provider]   Coenzyme Q10 10 MG CAPS Take by mouth Yes [provider]   testosterone  cypionate (DEPOTESTOTERONE CYPIONATE) 200 MG/ML injection Inject 1 mL into the muscle every 14 days for 90 days. Yes Curtistine Powell Bottcher, PA   Garlic 300 MG CAPS Take by mouth Yes [provider]   Omega-3 Fatty Acids (FISH OIL) 500 MG CAPS Take 500 mg by mouth 2 times daily Yes [provider]       CareTeam (Including outside providers/suppliers regularly involved in providing care):   Patient Care Team:  Mathew Etha HERO, MD as PCP - General (Family Medicine)  Mathew Etha HERO, MD as PCP - Empaneled Provider  Abron Fitch, MD (Dermatology)  Donaciano Dorn BROCKS, MD as Consulting Physician (Urology)     Recommendations for Preventive Services Due: see orders and patient instructions/AVS.  Recommended screening schedule for the next 5-10 years is provided to the patient in written form: see Patient Instructions/AVS.     Reviewed and updated this visit:  Tobacco  Allergies  Meds  Med Hx  Surg Hx  Fam Hx       Return in about 6 months (around 02/13/2025), or with Dr Mathew.

## 2024-09-16 DIAGNOSIS — R7301 Impaired fasting glucose: Secondary | ICD-10-CM | POA: Diagnosis not present

## 2024-09-16 DIAGNOSIS — E7849 Other hyperlipidemia: Secondary | ICD-10-CM | POA: Diagnosis not present

## 2024-09-16 DIAGNOSIS — E039 Hypothyroidism, unspecified: Secondary | ICD-10-CM | POA: Diagnosis not present

## 2024-09-16 DIAGNOSIS — Z1212 Encounter for screening for malignant neoplasm of rectum: Secondary | ICD-10-CM | POA: Diagnosis not present

## 2024-09-16 DIAGNOSIS — Z125 Encounter for screening for malignant neoplasm of prostate: Secondary | ICD-10-CM | POA: Diagnosis not present

## 2024-09-16 DIAGNOSIS — E7800 Pure hypercholesterolemia, unspecified: Secondary | ICD-10-CM | POA: Diagnosis not present

## 2024-09-17 ENCOUNTER — Other Ambulatory Visit: Payer: Self-pay | Admitting: Medical Genetics

## 2024-09-17 DIAGNOSIS — Z006 Encounter for examination for normal comparison and control in clinical research program: Secondary | ICD-10-CM

## 2024-09-23 DIAGNOSIS — Z1331 Encounter for screening for depression: Secondary | ICD-10-CM | POA: Diagnosis not present

## 2024-09-23 DIAGNOSIS — R82998 Other abnormal findings in urine: Secondary | ICD-10-CM | POA: Diagnosis not present

## 2024-09-23 DIAGNOSIS — Z1339 Encounter for screening examination for other mental health and behavioral disorders: Secondary | ICD-10-CM | POA: Diagnosis not present

## 2024-09-23 DIAGNOSIS — E039 Hypothyroidism, unspecified: Secondary | ICD-10-CM | POA: Diagnosis not present

## 2024-09-23 DIAGNOSIS — R7301 Impaired fasting glucose: Secondary | ICD-10-CM | POA: Diagnosis not present

## 2024-09-23 DIAGNOSIS — E7800 Pure hypercholesterolemia, unspecified: Secondary | ICD-10-CM | POA: Diagnosis not present

## 2024-09-23 DIAGNOSIS — Z0000 Encounter for general adult medical examination without abnormal findings: Secondary | ICD-10-CM | POA: Diagnosis not present

## 2024-09-23 DIAGNOSIS — N529 Male erectile dysfunction, unspecified: Secondary | ICD-10-CM | POA: Diagnosis not present

## 2024-09-23 DIAGNOSIS — B20 Human immunodeficiency virus [HIV] disease: Secondary | ICD-10-CM | POA: Diagnosis not present

## 2024-10-10 ENCOUNTER — Encounter

## 2024-10-10 MED ORDER — ROSUVASTATIN CALCIUM 20 MG PO TABS
20 | ORAL_TABLET | Freq: Every day | ORAL | 1 refills | Status: AC
Start: 2024-10-10 — End: 2025-07-07

## 2024-10-25 ENCOUNTER — Other Ambulatory Visit: Payer: Self-pay | Admitting: Internal Medicine

## 2024-11-26 LAB — GENECONNECT MOLECULAR SCREEN

## 2024-11-28 ENCOUNTER — Telehealth: Payer: Self-pay | Admitting: Medical Genetics

## 2024-11-28 DIAGNOSIS — Z006 Encounter for examination for normal comparison and control in clinical research program: Secondary | ICD-10-CM

## 2024-12-10 NOTE — Telephone Encounter (Signed)
 [facility]  12/10/2024 4:12 PM  Confirmed I was speaking with Zachary Levy [phone] by using name and DOB. Informed participant the reason for this call is to follow-up on a recent sample the participant provided at one of the [facility] lab locations. Informed participant the test was not able to be completed with this sample and apologized for the inconvenience. Participant was requested to provide a new sample at one of our participating labs at no cost so that participant can continue participation and receive test results. Informed participant they do not need to be fasting and if there are other samples that need to be drawn, they can be done at the same visit. Participant has not had a blood transfusion or blood product in the last 30 days. Participant agreed to provide another sample. Participant was provided the [employer] program website to learn why this may have happened. Participant was thanked for their time and continued support of the above study.    [name], BS [name]  [facility] Clinical Research Specialist II Direct Dial: [phone]  Fax: [phone]

## 2024-12-30 ENCOUNTER — Other Ambulatory Visit
